# Patient Record
Sex: Female | Born: 1950 | Race: White | Hispanic: No | Marital: Married | Smoking: Former smoker
Health system: Southern US, Community
[De-identification: ages and names within clinical notes are randomized; demographics above are authoritative.]

## PROBLEM LIST (undated history)

## (undated) DIAGNOSIS — I10 Essential (primary) hypertension: Secondary | ICD-10-CM

## (undated) DIAGNOSIS — IMO0001 Reserved for inherently not codable concepts without codable children: Secondary | ICD-10-CM

## (undated) DIAGNOSIS — F32A Depression, unspecified: Secondary | ICD-10-CM

## (undated) DIAGNOSIS — G4730 Sleep apnea, unspecified: Secondary | ICD-10-CM

## (undated) DIAGNOSIS — J189 Pneumonia, unspecified organism: Secondary | ICD-10-CM

## (undated) DIAGNOSIS — E785 Hyperlipidemia, unspecified: Secondary | ICD-10-CM

## (undated) DIAGNOSIS — T8859XA Other complications of anesthesia, initial encounter: Secondary | ICD-10-CM

## (undated) DIAGNOSIS — F329 Major depressive disorder, single episode, unspecified: Secondary | ICD-10-CM

## (undated) DIAGNOSIS — E119 Type 2 diabetes mellitus without complications: Secondary | ICD-10-CM

## (undated) DIAGNOSIS — J302 Other seasonal allergic rhinitis: Secondary | ICD-10-CM

## (undated) DIAGNOSIS — F419 Anxiety disorder, unspecified: Secondary | ICD-10-CM

## (undated) DIAGNOSIS — K219 Gastro-esophageal reflux disease without esophagitis: Secondary | ICD-10-CM

## (undated) DIAGNOSIS — R42 Dizziness and giddiness: Secondary | ICD-10-CM

## (undated) DIAGNOSIS — M539 Dorsopathy, unspecified: Secondary | ICD-10-CM

## (undated) DIAGNOSIS — M1990 Unspecified osteoarthritis, unspecified site: Secondary | ICD-10-CM

## (undated) DIAGNOSIS — N189 Chronic kidney disease, unspecified: Secondary | ICD-10-CM

## (undated) DIAGNOSIS — T4145XA Adverse effect of unspecified anesthetic, initial encounter: Secondary | ICD-10-CM

## (undated) HISTORY — DX: Hyperlipidemia, unspecified: E78.5

## (undated) HISTORY — PX: TUBAL LIGATION: SHX77

## (undated) HISTORY — PX: TONSILLECTOMY: SUR1361

## (undated) HISTORY — PX: BACK SURGERY: SHX140

## (undated) HISTORY — PX: NASAL SINUS SURGERY: SHX719

---

## 2010-10-22 ENCOUNTER — Emergency Department (HOSPITAL_COMMUNITY)
Admission: EM | Admit: 2010-10-22 | Discharge: 2010-10-22 | Disposition: A | Discharge status: Home / Self Care | Payer: BC Managed Care – PPO | Attending: Emergency Medicine | Admitting: Emergency Medicine

## 2010-10-22 DIAGNOSIS — K5289 Other specified noninfective gastroenteritis and colitis: Secondary | ICD-10-CM | POA: Insufficient documentation

## 2010-10-22 DIAGNOSIS — I10 Essential (primary) hypertension: Secondary | ICD-10-CM | POA: Insufficient documentation

## 2010-10-22 LAB — DIFFERENTIAL
Eosinophils Relative: 0 % (ref 0–5)
Lymphocytes Relative: 4 % — ABNORMAL LOW (ref 12–46)
Lymphs Abs: 0.5 10*3/uL — ABNORMAL LOW (ref 0.7–4.0)
Monocytes Absolute: 0.8 10*3/uL (ref 0.1–1.0)
Monocytes Relative: 6 % (ref 3–12)

## 2010-10-22 LAB — COMPREHENSIVE METABOLIC PANEL
Albumin: 3.7 g/dL (ref 3.5–5.2)
BUN: 21 mg/dL (ref 6–23)
Chloride: 95 mEq/L — ABNORMAL LOW (ref 96–112)
Creatinine, Ser: 1.2 mg/dL (ref 0.4–1.2)
GFR calc non Af Amer: 46 mL/min — ABNORMAL LOW (ref >60)
Total Bilirubin: 0.6 mg/dL (ref 0.3–1.2)

## 2010-10-22 LAB — LIPASE, BLOOD: Lipase: 14 U/L (ref 11–59)

## 2010-10-22 LAB — CBC
HCT: 41 % (ref 36.0–46.0)
MCV: 99 fL (ref 78.0–100.0)
RDW: 12.2 % (ref 11.5–15.5)
WBC: 13.3 10*3/uL — ABNORMAL HIGH (ref 4.0–10.5)

## 2010-10-22 LAB — URINALYSIS, ROUTINE W REFLEX MICROSCOPIC
Ketones, ur: NEGATIVE mg/dL
Leukocytes, UA: NEGATIVE
Nitrite: NEGATIVE
Urobilinogen, UA: 0.2 mg/dL (ref 0.0–1.0)
pH: 5.5 (ref 5.0–8.0)

## 2010-10-24 ENCOUNTER — Inpatient Hospital Stay (HOSPITAL_COMMUNITY)
Admission: EM | Admit: 2010-10-24 | Discharge: 2010-10-28 | DRG: 182 | Disposition: A | Discharge status: Home / Self Care | Payer: BC Managed Care – PPO | Attending: Internal Medicine | Admitting: Internal Medicine

## 2010-10-24 ENCOUNTER — Emergency Department (HOSPITAL_COMMUNITY): Payer: BC Managed Care – PPO

## 2010-10-24 DIAGNOSIS — K219 Gastro-esophageal reflux disease without esophagitis: Secondary | ICD-10-CM | POA: Diagnosis present

## 2010-10-24 DIAGNOSIS — A0472 Enterocolitis due to Clostridium difficile, not specified as recurrent: Principal | ICD-10-CM | POA: Diagnosis present

## 2010-10-24 DIAGNOSIS — E876 Hypokalemia: Secondary | ICD-10-CM | POA: Diagnosis not present

## 2010-10-24 DIAGNOSIS — F3289 Other specified depressive episodes: Secondary | ICD-10-CM | POA: Diagnosis present

## 2010-10-24 DIAGNOSIS — I10 Essential (primary) hypertension: Secondary | ICD-10-CM | POA: Diagnosis present

## 2010-10-24 DIAGNOSIS — F329 Major depressive disorder, single episode, unspecified: Secondary | ICD-10-CM | POA: Diagnosis present

## 2010-10-24 DIAGNOSIS — R7401 Elevation of levels of liver transaminase levels: Secondary | ICD-10-CM | POA: Diagnosis present

## 2010-10-24 DIAGNOSIS — R7402 Elevation of levels of lactic acid dehydrogenase (LDH): Secondary | ICD-10-CM | POA: Diagnosis present

## 2010-10-24 LAB — COMPREHENSIVE METABOLIC PANEL
ALT: 45 U/L — ABNORMAL HIGH (ref 0–35)
CO2: 27 mEq/L (ref 19–32)
Calcium: 9.3 mg/dL (ref 8.4–10.5)
Chloride: 101 mEq/L (ref 96–112)
Creatinine, Ser: 0.8 mg/dL (ref 0.4–1.2)
GFR calc Af Amer: >60 mL/min (ref >60)
GFR calc non Af Amer: >60 mL/min (ref >60)
Glucose, Bld: 99 mg/dL (ref 70–99)
Sodium: 136 mEq/L (ref 135–145)
Total Bilirubin: 0.5 mg/dL (ref 0.3–1.2)

## 2010-10-24 LAB — CBC
Hemoglobin: 13.2 g/dL (ref 12.0–15.0)
MCH: 33.2 pg (ref 26.0–34.0)
MCV: 99 fL (ref 78.0–100.0)
RBC: 3.97 MIL/uL (ref 3.87–5.11)

## 2010-10-24 LAB — URINALYSIS, ROUTINE W REFLEX MICROSCOPIC
Ketones, ur: NEGATIVE mg/dL
Protein, ur: NEGATIVE mg/dL
Urobilinogen, UA: 0.2 mg/dL (ref 0.0–1.0)

## 2010-10-24 LAB — URINE MICROSCOPIC-ADD ON

## 2010-10-24 LAB — DIFFERENTIAL
Lymphs Abs: 0.7 10*3/uL (ref 0.7–4.0)
Monocytes Relative: 12 % (ref 3–12)
Neutro Abs: 5.9 10*3/uL (ref 1.7–7.7)
Neutrophils Relative %: 76 % (ref 43–77)

## 2010-10-24 LAB — CLOSTRIDIUM DIFFICILE BY PCR: Toxigenic C. Difficile by PCR: POSITIVE — AB

## 2010-10-24 LAB — POCT OCCULT BLOOD STOOL (DEVICE): Fecal Occult Bld: POSITIVE

## 2010-10-24 MED ORDER — IOHEXOL 300 MG/ML  SOLN
100.0000 mL | Freq: Once | INTRAMUSCULAR | Status: AC | PRN
  Administered 2010-10-24: 100 mL via INTRAVENOUS

## 2010-10-25 LAB — BASIC METABOLIC PANEL
BUN: 5 mg/dL — ABNORMAL LOW (ref 6–23)
CO2: 24 mEq/L (ref 19–32)
Calcium: 8.5 mg/dL (ref 8.4–10.5)
Chloride: 105 mEq/L (ref 96–112)
Creatinine, Ser: 0.76 mg/dL (ref 0.4–1.2)
GFR calc Af Amer: >60 mL/min (ref >60)
GFR calc non Af Amer: >60 mL/min (ref >60)
Glucose, Bld: 86 mg/dL (ref 70–99)
Potassium: 3.3 mEq/L — ABNORMAL LOW (ref 3.5–5.1)
Sodium: 138 mEq/L (ref 135–145)

## 2010-10-25 LAB — OVA AND PARASITE EXAMINATION

## 2010-10-25 LAB — FECAL LACTOFERRIN, QUANT: Fecal Lactoferrin: POSITIVE

## 2010-10-26 LAB — DIFFERENTIAL
Eosinophils Relative: 4 % (ref 0–5)
Lymphocytes Relative: 18 % (ref 12–46)
Lymphs Abs: 1.2 10*3/uL (ref 0.7–4.0)
Monocytes Absolute: 0.9 10*3/uL (ref 0.1–1.0)

## 2010-10-26 LAB — CBC
HCT: 37 % (ref 36.0–46.0)
Hemoglobin: 12.6 g/dL (ref 12.0–15.0)
MCH: 33.5 pg (ref 26.0–34.0)
MCHC: 34.1 g/dL (ref 30.0–36.0)
MCV: 98.4 fL (ref 78.0–100.0)
Platelets: 184 K/uL (ref 150–400)
RBC: 3.76 MIL/uL — ABNORMAL LOW (ref 3.87–5.11)
RDW: 11.9 % (ref 11.5–15.5)
WBC: 6.5 K/uL (ref 4.0–10.5)

## 2010-10-26 LAB — BASIC METABOLIC PANEL
BUN: 3 mg/dL — ABNORMAL LOW (ref 6–23)
Chloride: 106 mEq/L (ref 96–112)
GFR calc non Af Amer: >60 mL/min (ref >60)
Glucose, Bld: 106 mg/dL — ABNORMAL HIGH (ref 70–99)
Potassium: 4 mEq/L (ref 3.5–5.1)

## 2010-10-26 LAB — HEPATIC FUNCTION PANEL
AST: 60 U/L — ABNORMAL HIGH (ref 0–37)
Bilirubin, Direct: 0.2 mg/dL (ref 0.0–0.3)
Total Bilirubin: 0.6 mg/dL (ref 0.3–1.2)

## 2010-10-28 NOTE — Discharge Summary (Signed)
Ariel Meyer, Ariel Meyer              ACCOUNT NO.:  [id number]  MEDICAL RECORD NO.:  [id number]  LOCATION:  A303                          FACILITY:  [facility]  PHYSICIAN:  Elliot Cousin, M.D.    DATE OF BIRTH:  [birth year]  DATE OF ADMISSION:  10/24/2010 DATE OF DISCHARGE:  06/16/2012LH                         DISCHARGE SUMMARY-REFERRING   DATE OF DISCHARGE:  Either October 28, 2010 or October 29, 2010.  DISCHARGE DIAGNOSES: 1. Diarrhea secondary to Clostridium difficile colitis. 2. Hypokalemia, secondary to diarrhea. 3. Mild hepatic transaminitis of unknown significance. 4. Elevated lipase, which resolved. 5. History of hypertension with low normal blood pressures during the     hospitalization. 6. Gastroesophageal reflux disease. 7. Depression.  DISCHARGE MEDICATIONS:  To be dictated at the time of hospital discharge.  CONSULTATIONS:  None.  PROCEDURES PERFORMED:  CT scan of the abdomen and pelvis on October 24, 2010.  The results revealed a suspicion of colitis superimposed upon the colonic under distention.  Ascending and proximal to mid transverse colon involvement.  Normal apex.  Remote left tenth rib fracture.  Left hepatic lobe cyst.  Normal spleen, stomach, pancreas, gallbladder, biliary tract, and adrenal glands.  HISTORY OF PRESENT ILLNESS:  The patient is a 60 year old woman with a past medical history significant for hypertension, who presented to the emergency department on October 24, 2010 with a chief complaint of diarrhea.  During the history gathering, the patient disclosed that she had taken a Z-PAK in March for treatment of an infection.  Since that time, she has had no further antibiotic therapy.  During the initial evaluation in the emergency department, the patient was noted to be afebrile and hemodynamically stable, although her blood pressure was on the lower side of normal.  Her white blood cell count was 7.8.  Her blood lipase was elevated at 70.  Her SGOT was  elevated at 49, and her SGPT was elevated at 45.  She was admitted for further evaluation and management.  HOSPITAL COURSE:  The patient was started on Cipro and Flagyl empirically.  A CT scan of her abdomen and pelvis was ordered.  The findings were consistent with colitis.  Stool studies were ordered. Stool for fecal blood was positive.  Fecal lactoferrin was positive. Fecal ova and parasite exam was negative, however, there was a moderate amount of WBCs.  C. difficile by PCR was positive.  Once this was known, Cipro was discontinued and Flagyl was continued orally as previously ordered.  Her nausea was treated with as-needed Zofran and Phenergan.  Her pain was treated with as-needed hydromorphone.  Maintenance IV fluids were started on admission.  She was also given a clear liquid diet initially.  Her IV fluids were changed to include potassium chloride as her serum potassium fell to 3.3.  Once her nausea subsided, she was repleted with potassium chloride orally.  Blood cultures, which had been ordered on admission have remained negative.  Her liver transaminases remained elevated, but only slightly so.  Her SGOT increased slightly to 60 and her SGPT increased slightly to 55.  On the CT scan of her abdomen and pelvis, it was noted that she had a hepatic cyst, but no other abnormalities  with the gallbladder or the biliary tree.  This can be monitored in the outpatient setting. Also, the patient's lipase was modestly elevated on admission, but normalized to 28.  Her amylase was within normal limits at 36.  She had no complaints of pain with urination.  Her urinalysis was negative for nitrites and leukocytes.  Later, she did complain of gastroesophageal reflux symptoms.  Omeprazole was initially withheld due to C. difficile infection.  Pepcid was started for her symptoms, however, after several days of Flagyl therapy, it was decided that omeprazole could be safely restarted.   Therefore, Pepcid was discontinued in favor of omeprazole due to her rebound reflux symptoms.  Her blood pressures have been on the lower side of normal.  Both amlodipine and hydrochlorothiazide were withheld during the hospitalization while the patient was being hydrated.  It is likely that she may need to restart amlodipine in the next few days.  Over the past 24 hours, the extent of her diarrhea has subsided.  Her stools are starting to form some; they are less watery.  Her diet was advanced to a full liquid diet yesterday.  It will be advanced to a low residue diet for dinner today.  If she continues to tolerate solid foods and if her symptoms remained improved, she could possibly be discharged to home on Flagyl for a total of 10-14 days in the next 24-48 hours.  We will add Flora-Q 1 capsule daily in the morning.  The patient will be evaluated by Dr. Crista Curb tomorrow.  Final disposition will be determined by her.  In the meantime, the patient was advised to increase her activity in the room and to sit up out of bed at least several hours daily.     Elliot Cousin, M.D.     DF/MEDQ  D:  10/27/2010  T:  10/27/2010  Job:  [id number]  Electronically Signed by Elliot Cousin M.D. on 10/28/2010 07:01:29 AM

## 2010-10-29 LAB — CULTURE, BLOOD (ROUTINE X 2)
Culture: NO GROWTH
Culture: NO GROWTH

## 2010-10-29 NOTE — H&P (Signed)
NAMEMEGHNA, HAGMANN              ACCOUNT NO.:  [id number]  MEDICAL RECORD NO.:  [id number]  LOCATION:  A303                          FACILITY:  [facility]  PHYSICIAN:  Celso Amy, MD   DATE OF BIRTH:  1951-01-29  DATE OF ADMISSION:  10/24/2010 DATE OF DISCHARGE:  LH                             HISTORY & PHYSICAL   CHIEF COMPLAINT:  Diarrhea.  HISTORY OF PRESENT ILLNESS:  The patient is a 60 year old white female with a past medical history of hypertension who presented to ER with a chief complaint of diarrhea.  History of present illness dates back to 5 days ago when the patient had sudden onset of diarrhea.  The patient says that the number of episodes are innumerable.  The consistency is watery and petering has been continuous.  The course has been unchanged over past 5 days.  The patient came to the ER on June 10 with a chief complaint of nausea, vomiting at that time.  The patient was seen in the ER, was thought it was gastroenteritis.  The patient was discharged home and the patient's nausea, vomiting resolved, but the patient's diarrhea continued to persist.  The patient offers no complaint of blood in stool.  No complaint of blood in emesis.  No complaint of abdominal pain.  The patient complains of fever which was last time around 2 days ago.  The patient has not been febrile since then.  The patient has history of antibiotic use in March 2012 when the patient was on Z-Pak. The patient has a granddaughter whom she takes care of and there was a history of antibiotic use in the granddaughter.  There is no family history of similar sickness.  The patient has well-water supply.  Does not have a county supply.  There is no history of recent travel.  No complaint of dizziness, shortness of breath, dysuria, vertigo or vomiting.  No complaint of any focal motor deficits.  No complaint of change in vision.  No complaint of hematuria.  REVIEW OF SYSTEMS:  Review of system is  negative besides the HPI.  ALLERGIES:  The patient has no known drug allergies.  FAMILY HISTORY:  The patient's mother died at the age of 67 from dementia.  The patient's father died at the age of 20 from heart disease.  SOCIAL HISTORY:  The patient quit smoking in 2010.  The patient drinks occasionally.  PAST MEDICAL HISTORY:  Positive for; 1. Nasal allergies. 2. GERD. 3. Hypertension. 4. Osteoarthritis.  MEDICATIONS:  The patient is on; 1. Lomotil p.r.n. 2. Lortab 5/325 p.r.n. 3. Multivitamin 1 tablet p.o. daily. 4. Osteo Bi-Flex 1 tablet p.o. daily. 5. Vitamin D3 1000 units 1 tablet p.o. daily. 6. Amlodipine 5 mg 1 tablet p.o. daily. 7. Fluoxetine 60 mg 1 tablet p.o. daily. 8. Hydrochlorothiazide 25 mg 1 tablet p.o. daily. 9. Omeprazole 20 mg 1 tablet p.o. daily.  PHYSICAL EXAMINATION:  VITAL SIGNS:  Blood pressures ranging from 98-112 by 59-74, pulse 76, respiratory rate 16, temperature afebrile. GENERAL:  The patient is awake, alert, oriented to time, place and person, resting comfortably in the bed, well built. HEENT:  Pupils are equally reactive to light and accommodation.  Extraocular movement intact.  Head is atraumatic, normocephalic.  NECK: Supple. RESPIRATORY:  No acute respiratory distress.  Chest is clear to auscultation bilaterally. CARDIOVASCULAR:  S1 and S2 is regular in rhythm.  No murmurs or rubs were appreciated. GIT:  Bowel sounds are present.  Abdomen is soft, nontender, nondistended. EXTREMITIES:  No lower extremity edema.  No cyanosis was seen. SKIN:  There is no rash. PSYCH:  The patient is in normal mood and affect.  LABORATORY FINDINGS:  The patient's labs done on June 12 shows sodium of 136, potassium of 3.6, serum chloride 101, bicarb 27, BUN 9, serum creatinine 0.8, glucose is 99.  The patient's WBC 7.8, the patient's hemoglobin 13.2, patient's platelets 185, ALP is 128, AST is 49, ALT is 45, lipase is 70.  The patient had a CT abdomen  which shows suspect colitis superimposed on colonic underdistention.  IMPRESSION: 1. Gastrointestinal .  The patient is being admitted with colitis.     The patient has remote history of antibiotic exposure.  The patient     has well water source, but nobody else is sick in the family.  This     seems like an infectious etiology. 2. Cardiovascular:  The patient is hypotensive but no need of     pressors. 3. Fluid, electrolyte, nutrition.  The patient is hypovolemic, is     normokalemic, normonatremic. 4. Deep vein thrombosis.  We will keep the patient on DVT prophylaxis.  PLAN: 1. We will get stools for lactoferrin, wbc, ova, parasites and C. diff     PCR. 2. We will start the patient on Cipro and Flagyl as this seems like an     infectious colitis. 3. We will start the patient on IV fluids as the patient is     hypovolemic. 4. We will hold antihypertensive medicines which are ACE and     hydrochlorothiazide as the patient is     hypovolemic. 5. We will keep the patient on DVT prophylaxis. 6. The patient's further course depends how the patient does with this     treatment.     Celso Amy, MD     MB/MEDQ  D:  10/24/2010  T:  10/25/2010  Job:  [id number]  Electronically Signed by Celso Amy M.D. on 10/29/2010 12:21:44 PM

## 2010-10-29 NOTE — H&P (Signed)
  Ariel Meyer, Ariel Meyer              ACCOUNT NO.:  [id number]  MEDICAL RECORD NO.:  [id number]  LOCATION:  A303                          FACILITY:  [facility]  PHYSICIAN:  Celso Amy, MD   DATE OF BIRTH:  1950/05/16  DATE OF ADMISSION:  10/24/2010 DATE OF DISCHARGE:  LH                             HISTORY & PHYSICAL   ADDENDUM:  ALLERGIES:  The patient is allergic to SULFA, this causes her to have inflamed mucous membranes; MACRODANTIN, this causes her to have vomiting and passing out; PENICILLIN, this causes her to have rash and she cannot take Tylenol because of fatty liver.     Celso Amy, MD     MB/MEDQ  D:  10/24/2010  T:  10/25/2010  Job:  [id number]  Electronically Signed by Celso Amy M.D. on 10/29/2010 12:21:50 PM

## 2010-10-30 NOTE — Discharge Summary (Signed)
  NAMEJAMIE-LEE, Ariel Meyer              ACCOUNT NO.:  [id number]  MEDICAL RECORD NO.:  [id number]  LOCATION:  A303                          FACILITY:  [facility]  PHYSICIAN:  Lonny Eisen L. Lendell Meyer, MDDATE OF BIRTH:  1950/07/16  DATE OF ADMISSION:  10/24/2010 DATE OF DISCHARGE:  06/16/2012LH                              DISCHARGE SUMMARY   ADDENDUM  This is an addendum to the previously dictated discharge summary by Dr. Sherrie Mustache.  DISCHARGE MEDICATIONS: 1. Metronidazole 500 mg p.o. t.i.d. until gone to complete a 10-day     course. 2. Flora-Q p.o. daily. 3. Carafate slurry 1 g p.o. q.a.c. and at bedtime. 4. Hydrocodone/APAP 5/325 mg 1 p.o. q.6 h. p.r.n. pain. 5. Osteo Bi-Flex daily. 6. Vitamin D3 1000 units daily. 7. Hold amlodipine and hydrochlorothiazide until systolic blood     pressure above 130. 8. Fluoxetine as previous. 9. Stop omeprazole. 10.Continue aspirin 325 mg p.o. b.i.d. p.r.n. pain. 11.Zyrtec 10 mg a day. 12.Artificial tears daily as needed.  CONDITION:  Stable.  ACTIVITY:  Ad lib.  DIET:  As tolerated.  Overnight, the patient did have some acid reflux and vomiting.  Today, however, she is doing better.  She was able to tolerate 100% of her solid lunch and is requesting discharge.  Total time on the day of discharge is greater than 30 minutes.     Ariel Switalski L. Lendell Caprice, MD     CLS/MEDQ  D:  10/28/2010  T:  10/29/2010  Job:  [id number]  Electronically Signed by Crista Curb MD on 10/30/2010 10:50:25 AM

## 2011-03-22 ENCOUNTER — Other Ambulatory Visit: Payer: Self-pay | Admitting: Podiatry

## 2011-03-22 DIAGNOSIS — M79673 Pain in unspecified foot: Secondary | ICD-10-CM

## 2011-03-22 DIAGNOSIS — M25571 Pain in right ankle and joints of right foot: Secondary | ICD-10-CM

## 2011-03-23 ENCOUNTER — Other Ambulatory Visit: Payer: BC Managed Care – PPO

## 2011-03-27 ENCOUNTER — Ambulatory Visit
Admission: RE | Admit: 2011-03-27 | Discharge: 2011-03-27 | Disposition: A | Discharge status: Home / Self Care | Payer: BC Managed Care – PPO | Source: Ambulatory Visit | Attending: Podiatry | Admitting: Podiatry

## 2011-03-27 DIAGNOSIS — M79673 Pain in unspecified foot: Secondary | ICD-10-CM

## 2011-03-27 DIAGNOSIS — M25571 Pain in right ankle and joints of right foot: Secondary | ICD-10-CM

## 2011-05-22 ENCOUNTER — Other Ambulatory Visit: Payer: Self-pay | Admitting: Family Medicine

## 2011-05-22 DIAGNOSIS — Z1231 Encounter for screening mammogram for malignant neoplasm of breast: Secondary | ICD-10-CM

## 2011-06-11 ENCOUNTER — Ambulatory Visit
Admission: RE | Admit: 2011-06-11 | Discharge: 2011-06-11 | Disposition: A | Discharge status: Home / Self Care | Payer: BC Managed Care – PPO | Source: Ambulatory Visit | Attending: Family Medicine | Admitting: Family Medicine

## 2011-06-11 DIAGNOSIS — Z1231 Encounter for screening mammogram for malignant neoplasm of breast: Secondary | ICD-10-CM

## 2012-05-28 ENCOUNTER — Emergency Department (HOSPITAL_COMMUNITY): Payer: BC Managed Care – PPO

## 2012-05-28 ENCOUNTER — Emergency Department (HOSPITAL_COMMUNITY)
Admission: EM | Admit: 2012-05-28 | Discharge: 2012-05-28 | Disposition: A | Discharge status: Home / Self Care | Payer: BC Managed Care – PPO | Attending: Emergency Medicine | Admitting: Emergency Medicine

## 2012-05-28 ENCOUNTER — Encounter (HOSPITAL_COMMUNITY): Payer: Self-pay

## 2012-05-28 DIAGNOSIS — K219 Gastro-esophageal reflux disease without esophagitis: Secondary | ICD-10-CM | POA: Insufficient documentation

## 2012-05-28 DIAGNOSIS — Y9389 Activity, other specified: Secondary | ICD-10-CM | POA: Insufficient documentation

## 2012-05-28 DIAGNOSIS — S32009A Unspecified fracture of unspecified lumbar vertebra, initial encounter for closed fracture: Secondary | ICD-10-CM | POA: Insufficient documentation

## 2012-05-28 DIAGNOSIS — S32010A Wedge compression fracture of first lumbar vertebra, initial encounter for closed fracture: Secondary | ICD-10-CM

## 2012-05-28 DIAGNOSIS — Y92009 Unspecified place in unspecified non-institutional (private) residence as the place of occurrence of the external cause: Secondary | ICD-10-CM | POA: Insufficient documentation

## 2012-05-28 DIAGNOSIS — R51 Headache: Secondary | ICD-10-CM | POA: Insufficient documentation

## 2012-05-28 DIAGNOSIS — W010XXA Fall on same level from slipping, tripping and stumbling without subsequent striking against object, initial encounter: Secondary | ICD-10-CM | POA: Insufficient documentation

## 2012-05-28 DIAGNOSIS — R0602 Shortness of breath: Secondary | ICD-10-CM | POA: Insufficient documentation

## 2012-05-28 DIAGNOSIS — I10 Essential (primary) hypertension: Secondary | ICD-10-CM | POA: Insufficient documentation

## 2012-05-28 DIAGNOSIS — Z79899 Other long term (current) drug therapy: Secondary | ICD-10-CM | POA: Insufficient documentation

## 2012-05-28 DIAGNOSIS — R0789 Other chest pain: Secondary | ICD-10-CM | POA: Insufficient documentation

## 2012-05-28 DIAGNOSIS — Z8739 Personal history of other diseases of the musculoskeletal system and connective tissue: Secondary | ICD-10-CM | POA: Insufficient documentation

## 2012-05-28 HISTORY — DX: Essential (primary) hypertension: I10

## 2012-05-28 HISTORY — DX: Dorsopathy, unspecified: M53.9

## 2012-05-28 MED ORDER — ONDANSETRON HCL 4 MG/2ML IJ SOLN
4.0000 mg | Freq: Once | INTRAMUSCULAR | Status: AC
  Administered 2012-05-28: 4 mg via INTRAVENOUS
  Filled 2012-05-28: qty 2

## 2012-05-28 MED ORDER — HYDROMORPHONE HCL PF 1 MG/ML IJ SOLN
1.0000 mg | Freq: Once | INTRAMUSCULAR | Status: AC
  Administered 2012-05-28: 1 mg via INTRAVENOUS
  Filled 2012-05-28: qty 1

## 2012-05-28 MED ORDER — SODIUM CHLORIDE 0.9 % IV BOLUS (SEPSIS)
250.0000 mL | Freq: Once | INTRAVENOUS | Status: AC
  Administered 2012-05-28: 12:00:00 via INTRAVENOUS

## 2012-05-28 MED ORDER — PROMETHAZINE HCL 25 MG PO TABS: 25.0000 mg | ORAL_TABLET | Freq: Four times a day (QID) | ORAL | Status: DC | PRN

## 2012-05-28 MED ORDER — SODIUM CHLORIDE 0.9 % IV SOLN
INTRAVENOUS | Status: DC
  Administered 2012-05-28: 12:00:00 via INTRAVENOUS

## 2012-05-28 MED ORDER — HYDROMORPHONE HCL 2 MG PO TABS: 2.0000 mg | ORAL_TABLET | ORAL | Status: DC | PRN

## 2012-05-28 MED ORDER — HYDROCODONE-ACETAMINOPHEN 5-325 MG PO TABS: 1.0000 | ORAL_TABLET | Freq: Four times a day (QID) | ORAL | Status: AC | PRN

## 2012-05-28 NOTE — ED Notes (Signed)
Patient still wants something to drink. RN made aware.

## 2012-05-28 NOTE — ED Notes (Signed)
Pt given meds for pain. Husband at bedside.

## 2012-05-28 NOTE — ED Notes (Signed)
Pt was putting wood in her wood stove. She lost her balance and fell. Complain of pain in lower back. Has a history of chronic back pain

## 2012-05-28 NOTE — ED Notes (Signed)
Patient would like something to drink. RN made aware. 

## 2012-05-28 NOTE — ED Notes (Signed)
Patient took her neck brace off at this time.

## 2012-05-28 NOTE — ED Notes (Signed)
Pt given additional meds for pain. Husband at bedside.

## 2012-05-28 NOTE — ED Notes (Signed)
Pt resting in bed, pain better after first meds. Repeating meds for continued pain.

## 2012-05-28 NOTE — ED Notes (Signed)
Patient wanted something to drink MD made aware wanted to wait till after CT scan. Patient made aware. Patient also states she would like some pain medicine. RN made aware.

## 2012-05-28 NOTE — ED Provider Notes (Signed)
History  This chart was scribed for Shelda Jakes, MD by Erskine Emery, ED Scribe. This patient was seen in room [facility] and the patient's care was started at 10:28.   CSN: [id number]  Arrival date & time 05/28/12  0944   First MD Initiated Contact with Patient 05/28/12 1028      Chief Complaint  Patient presents with  . Fall    (Consider location/radiation/quality/duration/timing/severity/associated sxs/prior treatment) The history is provided by the patient. No language interpreter was used.  Ariel Meyer is a 62 y.o. female brought in by ambulance, who presents to the Emergency Department complaining of aggravated bilateral low back pain since a fall this morning. Pt reports she was putting wood in her wood stove and lost her balance, falling onto her bottom. Pt has a h/o chronic back pain, aggravated by bending over. She does not take any medication other than ibuprofen for it. Pt reports some associated SOB, acid reflux from laying flat, and headache, but she denies any numbness in her feet, fevers, chest pain, abdominal pain, nausea, emesis, diarrhea, dysuria, rash, visual changes, or h/o bleeding easily. She has been on Zithromax for 3 days for a recent cold, including congestion, cough, and chest soreness. Pt is allergic to acetaminophen, penicillins, sudafed, macrodantin, and sulfa antibiotics.  Dr. Creta Levin at [facility] in [place] is the pt's PCP.  Past Medical History  Diagnosis Date  . Hypertension   . Back problem     Past Surgical History  Procedure Date  . Tubal ligation     No family history on file.  History  Substance Use Topics  . Smoking status: Not on file  . Smokeless tobacco: Not on file  . Alcohol Use: Yes    OB History    Grav Para Term Preterm Abortions TAB SAB Ect Mult Living                  Review of Systems  Constitutional: Negative for fever and chills.  HENT: Positive for congestion. Negative for sore throat and neck  pain.   Eyes: Negative for visual disturbance.  Respiratory: Positive for cough, chest tightness and shortness of breath.   Cardiovascular: Negative for chest pain.  Gastrointestinal: Negative for nausea, vomiting, abdominal pain and diarrhea.  Genitourinary: Negative for dysuria.  Musculoskeletal: Positive for back pain.  Skin: Negative for rash.  Neurological: Positive for headaches. Negative for weakness and numbness.  All other systems reviewed and are negative.    Allergies  Acetaminophen; Penicillins; Sudafed; Sulfa antibiotics; and Macrodantin  Home Medications   Current Outpatient Rx  Name  Route  Sig  Dispense  Refill  . RISAQUAD PO CAPS   Oral   Take 1 capsule by mouth daily.         Marland Kitchen AMLODIPINE BESYLATE 5 MG PO TABS   Oral   Take 5 mg by mouth daily.         . ATORVASTATIN CALCIUM 20 MG PO TABS   Oral   Take 20 mg by mouth daily.         . AZITHROMYCIN 250 MG PO TABS   Oral   Take 250 mg by mouth daily. 2 tabs on day 1, 1 tab thereafter until gone.         Marland Kitchen VITAMIN D 1000 UNITS PO TABS   Oral   Take 1,000 Units by mouth daily.         Marland Kitchen FLUOXETINE HCL 20 MG PO CAPS   Oral  Take 20 mg by mouth daily.         Marland Kitchen FLUTICASONE PROPIONATE 50 MCG/ACT NA SUSP   Nasal   Place 2 sprays into the nose daily.         Marland Kitchen HYDROCHLOROTHIAZIDE 25 MG PO TABS   Oral   Take 25 mg by mouth daily.         . OSTEO BI-FLEX ADV DOUBLE ST PO   Oral   Take 1 tablet by mouth daily.         . ADULT MULTIVITAMIN W/MINERALS CH   Oral   Take 1 tablet by mouth daily.         Marland Kitchen OMEPRAZOLE 40 MG PO CPDR   Oral   Take 40 mg by mouth daily.         Marland Kitchen HYDROCODONE-ACETAMINOPHEN 5-325 MG PO TABS   Oral   Take 1-2 tablets by mouth every 6 (six) hours as needed for pain.   20 tablet   0   . HYDROMORPHONE HCL 2 MG PO TABS   Oral   Take 1 tablet (2 mg total) by mouth every 4 (four) hours as needed for pain.   30 tablet   0   . PROMETHAZINE HCL 25 MG  PO TABS   Oral   Take 1 tablet (25 mg total) by mouth every 6 (six) hours as needed for nausea.   20 tablet   0     Triage Vitals: BP 158/102  Pulse 76  Temp 98.2 F (36.8 C) (Oral)  Resp 22  Ht 5\' 6"  (1.676 m)  Wt 220 lb (99.791 kg)  BMI 35.51 kg/m2  SpO2 96%  Physical Exam  Nursing note and vitals reviewed. Constitutional: She is oriented to person, place, and time. She appears well-developed and well-nourished. No distress.  HENT:  Head: Normocephalic and atraumatic.  Mouth/Throat: Oropharynx is clear and moist.  Eyes: EOM are normal. Pupils are equal, round, and reactive to light.  Neck: Neck supple. No tracheal deviation present.  Cardiovascular: Normal rate, regular rhythm and normal heart sounds.   Pulmonary/Chest: Effort normal and breath sounds normal. No respiratory distress. She has no wheezes.  Abdominal: Soft. Bowel sounds are normal. She exhibits no distension. There is no tenderness.  Musculoskeletal: Normal range of motion. She exhibits no edema.       Dorsalis pedis pulse is 2+ on both feet. Good movement of feet.  Lymphadenopathy:    She has no cervical adenopathy.  Neurological: She is alert and oriented to person, place, and time.  Skin: Skin is warm and dry.  Psychiatric: She has a normal mood and affect.    ED Course  Procedures (including critical care time) DIAGNOSTIC STUDIES: Oxygen Saturation is 96% on room air, adequate by my interpretation.    COORDINATION OF CARE: 11:29--I evaluated the patient and we discussed a treatment plan including IV pain medication, and chest and back x-rays to which the pt agreed.   Dg Chest 1 View  05/28/2012  *RADIOLOGY REPORT*  Clinical Data: Fall with back pain.  CHEST - 1 VIEW  Comparison: None.  Findings: Trachea is midline.  Heart size normal.  Transverse aorta appears prominent.  Scarring at the left lung base.  Lungs are otherwise clear.  No pleural flow  IMPRESSION:  1.  No acute findings. 2.  Transverse  aorta appears prominent, possibly ectatic.   Original Report Authenticated By: Leanna Battles, M.D.    Dg Lumbar Spine Complete  05/28/2012  *  RADIOLOGY REPORT*  Clinical Data: The fall with back pain.  LUMBAR SPINE - COMPLETE 4+ VIEW  Comparison: None.  Findings: Compression fracture of the L1 vertebral body is new since a CT scan of 10/24/2010. Imaging features are nonspecific, but this could represent an acute injury.  Lateral film is slightly rotated, but retropulsion of fragments in the anterior canal is a possibility.  IMPRESSION: L1 superior endplate fracture with approximate 25% loss of height anteriorly.  There is a question of posterior bony retropulsion into the anterior canal.  CT scan of the lumbar spine without contrast could be used further evaluate.   Original Report Authenticated By: Kennith Center, M.D.    Ct Lumbar Spine Wo Contrast  05/28/2012  *RADIOLOGY REPORT*  Clinical Data: Fall.  Low back pain.  Difficulty moving legs.  CT LUMBAR SPINE WITHOUT CONTRAST  Technique:  Multidetector CT imaging of the lumbar spine was performed without intravenous contrast administration. Multiplanar CT image reconstructions were also generated.  Comparison: Lumbar spine radiographs 05/28/2012.  Findings: Five non-rib bearing lumbar type vertebral bodies are present.  A superior endplate fracture is present at L1.  There is approximately 40% loss of height compared to the adjacent level, L2.  Retropulsed bone narrows the canal to 15 mm at the superior aspect of L1.  There is slight exaggerated kyphosis.  Dextroconvex scoliosis of the lumbar spine is centered at L2.  The fracture is more severe on the left than right, exaggerating the scoliosis.  No additional fractures are evident.  The remaining vertebral body heights and disc spaces are maintained.  The foramina are patent bilaterally.  Facet degenerative changes are present at L5-S1, right greater than left.  Limited imaging of the abdomen demonstrates  minimal atherosclerotic changes.  IMPRESSION:  1.  The superior endplate compression fracture at L1 is more severe left than right with slight exaggerated kyphosis and scoliosis. 2.  The retropulsed bone fragments along the superior endplate fracture of L1 narrow the canal to 15 mm without significant stenosis. 3.  Minimal atherosclerotic changes. 4.  Mild degenerative changes at the L5-S1 facet joints.   Original Report Authenticated By: Marin Roberts, M.D.       1. Compression fracture of L1 lumbar vertebra       MDM  Findings consistent with L1 lumbar vertebrae compression fracture. Patient with no neuro deficits. Pain improved in the emergency department. Will discharge home with pain medication orthopedic consultation and followup with her primary care Dr. Len Childs to her about the potential benefits of kyphoplasty.      I personally performed the services described in this documentation, which was scribed in my presence. The recorded information has been reviewed and is accurate.     Shelda Jakes, MD 05/28/12 [phone]

## 2012-05-28 NOTE — ED Notes (Signed)
Pt able to stand and walk to bathroom, dressed self. To car in wheelchair.

## 2012-05-28 NOTE — ED Notes (Signed)
Pt reports severe pain to her lower back.   Pt denies any LOC.

## 2012-06-09 ENCOUNTER — Ambulatory Visit (INDEPENDENT_AMBULATORY_CARE_PROVIDER_SITE_OTHER): Payer: BC Managed Care – PPO | Admitting: Orthopedic Surgery

## 2012-06-09 ENCOUNTER — Encounter: Payer: Self-pay | Admitting: Orthopedic Surgery

## 2012-06-09 VITALS — BP 110/80 | Ht 66.0 in | Wt 220.0 lb

## 2012-06-09 DIAGNOSIS — S32009A Unspecified fracture of unspecified lumbar vertebra, initial encounter for closed fracture: Secondary | ICD-10-CM

## 2012-06-09 DIAGNOSIS — M5430 Sciatica, unspecified side: Secondary | ICD-10-CM

## 2012-06-09 DIAGNOSIS — S32000A Wedge compression fracture of unspecified lumbar vertebra, initial encounter for closed fracture: Secondary | ICD-10-CM

## 2012-06-09 MED ORDER — PREDNISONE (PAK) 10 MG PO TABS: 10.0000 mg | ORAL_TABLET | Freq: Every day | ORAL | Status: DC

## 2012-06-09 MED ORDER — OXYCODONE-ACETAMINOPHEN 5-325 MG PO TABS: 1.0000 | ORAL_TABLET | ORAL | Status: DC | PRN

## 2012-06-09 MED ORDER — METHOCARBAMOL 500 MG PO TABS: 500.0000 mg | ORAL_TABLET | Freq: Three times a day (TID) | ORAL | Status: DC

## 2012-06-09 MED ORDER — GABAPENTIN 100 MG PO CAPS: 100.0000 mg | ORAL_CAPSULE | Freq: Three times a day (TID) | ORAL | Status: DC

## 2012-06-10 ENCOUNTER — Encounter: Payer: Self-pay | Admitting: Orthopedic Surgery

## 2012-06-10 DIAGNOSIS — S32000A Wedge compression fracture of unspecified lumbar vertebra, initial encounter for closed fracture: Secondary | ICD-10-CM | POA: Insufficient documentation

## 2012-06-10 DIAGNOSIS — M5430 Sciatica, unspecified side: Secondary | ICD-10-CM | POA: Insufficient documentation

## 2012-06-10 NOTE — Progress Notes (Signed)
  Subjective:    Patient ID: Ariel Meyer, female    DOB: December 04, 1950, 62 y.o.   MRN: [id number] Chief Complaint  Patient presents with  . Back Injury    L 1 compression fracture. DOI1-15-14.   She actually complains of bilateral lower back pain with a history of bilateral sciatica.  HPI  She says her pain is not at the fracture site but in the lower SI joints and left side of her lower back. She describes this as hip pain but denies groin pain or anterior thigh pain denies radicular symptoms denies saddle anesthesia denies bowel or bladder retention or incontinence   Review of Systems Patient response has been reviewed and is scanned as a scanned documents    Objective:   Physical Exam  Nursing note and vitals reviewed. Constitutional: She is oriented to person, place, and time. She appears well-developed and well-nourished. She appears distressed.  HENT:  Head: Normocephalic.  Neck: Normal range of motion. No JVD present. No tracheal deviation present.  Cardiovascular: Normal rate and intact distal pulses.   Pulmonary/Chest: Effort normal.  Abdominal: Soft.  Musculoskeletal:       Cervical back: Normal.       Thoracic back: Normal.       Lumbar back: She exhibits decreased range of motion, tenderness, bony tenderness, pain and spasm. She exhibits no swelling, no edema, no deformity and no laceration.  Lymphadenopathy:    She has no cervical adenopathy.  Neurological: She is alert and oriented to person, place, and time. She has normal reflexes. She displays normal reflexes. She exhibits normal muscle tone. Coordination normal.  Skin: Skin is warm and dry. No rash noted. She is not diaphoretic. No erythema. No pallor.  Psychiatric: She has a normal mood and affect. Her behavior is normal. Judgment and thought content normal.          Assessment & Plan:  CT scan and plain films show the chisel L1 compression fracture without spinal impingement or stenosis  However she  mainly complains of her sciatica on the left side in the lower back  Recommend oral prednisone Dosepak, gabapentin, Robaxin, Percocet for pain followup in one week to assess pain medication and her response to it  We discussed the possible kyphoplasty however she has no major symptoms at that level.

## 2012-06-16 ENCOUNTER — Encounter: Payer: Self-pay | Admitting: Orthopedic Surgery

## 2012-06-16 ENCOUNTER — Ambulatory Visit (INDEPENDENT_AMBULATORY_CARE_PROVIDER_SITE_OTHER): Payer: BC Managed Care – PPO | Admitting: Orthopedic Surgery

## 2012-06-16 VITALS — BP 110/78 | Ht 66.0 in | Wt 220.0 lb

## 2012-06-16 DIAGNOSIS — S32009A Unspecified fracture of unspecified lumbar vertebra, initial encounter for closed fracture: Secondary | ICD-10-CM

## 2012-06-16 DIAGNOSIS — S32000A Wedge compression fracture of unspecified lumbar vertebra, initial encounter for closed fracture: Secondary | ICD-10-CM

## 2012-06-16 DIAGNOSIS — M5430 Sciatica, unspecified side: Secondary | ICD-10-CM

## 2012-06-16 MED ORDER — OXYCODONE-ACETAMINOPHEN 5-325 MG PO TABS: 1.0000 | ORAL_TABLET | ORAL | Status: DC | PRN

## 2012-06-16 NOTE — Patient Instructions (Signed)
Continue present pain medications

## 2012-06-16 NOTE — Progress Notes (Signed)
Patient ID: Ariel Meyer, female   DOB: June 14, 1950, 62 y.o.   MRN: [id number] Chief Complaint  Patient presents with  . Follow-up    one week recheck lumbar fracture DOI 05/28/12    BP 110/78  Ht 5\' 6"  (1.676 m)  Wt 220 lb (99.791 kg)  BMI 35.51 kg/m2  1. Sciatica  oxyCODONE-acetaminophen (ROXICET) 5-325 MG per tablet  2. Lumbar compression fracture  oxyCODONE-acetaminophen (ROXICET) 5-325 MG per tablet    Current Outpatient Prescriptions on File Prior to Visit  Medication Sig Dispense Refill  . acidophilus (RISAQUAD) CAPS Take 1 capsule by mouth daily.      Marland Kitchen amLODipine (NORVASC) 5 MG tablet Take 5 mg by mouth daily.      Marland Kitchen atorvastatin (LIPITOR) 20 MG tablet Take 20 mg by mouth daily.      Marland Kitchen azithromycin (ZITHROMAX) 250 MG tablet Take 250 mg by mouth daily. 2 tabs on day 1, 1 tab thereafter until gone.      . cholecalciferol (VITAMIN D) 1000 UNITS tablet Take 1,000 Units by mouth daily.      Marland Kitchen FLUoxetine (PROZAC) 20 MG capsule Take 20 mg by mouth daily.      . fluticasone (FLONASE) 50 MCG/ACT nasal spray Place 2 sprays into the nose daily.      Marland Kitchen gabapentin (NEURONTIN) 100 MG capsule Take 1 capsule (100 mg total) by mouth 3 (three) times daily.  60 capsule  1  . hydrochlorothiazide (HYDRODIURIL) 25 MG tablet Take 25 mg by mouth daily.      Marland Kitchen HYDROmorphone (DILAUDID) 2 MG tablet Take 1 tablet (2 mg total) by mouth every 4 (four) hours as needed for pain.  30 tablet  0  . methocarbamol (ROBAXIN) 500 MG tablet Take 1 tablet (500 mg total) by mouth 3 (three) times daily.  60 tablet  1  . Misc Natural Products (OSTEO BI-FLEX ADV DOUBLE ST PO) Take 1 tablet by mouth daily.      . Multiple Vitamin (MULTIVITAMIN WITH MINERALS) TABS Take 1 tablet by mouth daily.      Marland Kitchen omeprazole (PRILOSEC) 40 MG capsule Take 40 mg by mouth daily.      . predniSONE (STERAPRED UNI-PAK) 10 MG tablet Take 1 tablet (10 mg total) by mouth daily. DS 12 days dose pack  10 tablet  1  . HYDROcodone-acetaminophen  (NORCO/VICODIN) 5-325 MG per tablet       . promethazine (PHENERGAN) 25 MG tablet Take 1 tablet (25 mg total) by mouth every 6 (six) hours as needed for nausea.  20 tablet  0  . traMADol (ULTRAM) 50 MG tablet         Followup visit status post lumbar spine fracture at L1 and bilateral hip pain in the lower back. We made improvement with Percocet, prednisone Dosepak Robaxin and Neurontin  Continue current pain management  New prescription written  Established problem stable  Followup in one week

## 2012-06-23 ENCOUNTER — Ambulatory Visit (INDEPENDENT_AMBULATORY_CARE_PROVIDER_SITE_OTHER): Payer: BC Managed Care – PPO | Admitting: Orthopedic Surgery

## 2012-06-23 VITALS — BP 106/70 | Ht 66.0 in | Wt 220.0 lb

## 2012-06-23 DIAGNOSIS — S32009A Unspecified fracture of unspecified lumbar vertebra, initial encounter for closed fracture: Secondary | ICD-10-CM

## 2012-06-23 DIAGNOSIS — S32000A Wedge compression fracture of unspecified lumbar vertebra, initial encounter for closed fracture: Secondary | ICD-10-CM

## 2012-06-23 DIAGNOSIS — M5430 Sciatica, unspecified side: Secondary | ICD-10-CM

## 2012-06-23 MED ORDER — HYDROCODONE-ACETAMINOPHEN 5-325 MG PO TABS: 1.0000 | ORAL_TABLET | ORAL | Status: DC | PRN

## 2012-06-23 NOTE — Patient Instructions (Addendum)
L5-S1 ESI with Dr Eduard Clos (or [facility])

## 2012-06-23 NOTE — Progress Notes (Signed)
Chief Complaint  Patient presents with  . Follow-up    Recheck lumbar compression fracture DOI 05/28/12    BP 106/70  Ht 5\' 6"  (1.676 m)  Wt 220 lb (99.791 kg)  BMI 35.53 kg/m2  1. Sciatica  HYDROcodone-acetaminophen (NORCO/VICODIN) 5-325 MG per tablet   HYDROcodone-acetaminophen (NORCO/VICODIN) 5-325 MG per tablet  2. Lumbar compression fracture  HYDROcodone-acetaminophen (NORCO/VICODIN) 5-325 MG per tablet   HYDROcodone-acetaminophen (NORCO/VICODIN) 5-325 MG per tablet    Current Outpatient Prescriptions on File Prior to Visit  Medication Sig Dispense Refill  . acidophilus (RISAQUAD) CAPS Take 1 capsule by mouth daily.      Marland Kitchen amLODipine (NORVASC) 5 MG tablet Take 5 mg by mouth daily.      Marland Kitchen atorvastatin (LIPITOR) 20 MG tablet Take 20 mg by mouth daily.      Marland Kitchen azithromycin (ZITHROMAX) 250 MG tablet Take 250 mg by mouth daily. 2 tabs on day 1, 1 tab thereafter until gone.      . cholecalciferol (VITAMIN D) 1000 UNITS tablet Take 1,000 Units by mouth daily.      Marland Kitchen FLUoxetine (PROZAC) 20 MG capsule Take 20 mg by mouth daily.      . fluticasone (FLONASE) 50 MCG/ACT nasal spray Place 2 sprays into the nose daily.      Marland Kitchen gabapentin (NEURONTIN) 100 MG capsule Take 1 capsule (100 mg total) by mouth 3 (three) times daily.  60 capsule  1  . hydrochlorothiazide (HYDRODIURIL) 25 MG tablet Take 25 mg by mouth daily.      Marland Kitchen HYDROcodone-acetaminophen (NORCO/VICODIN) 5-325 MG per tablet       . methocarbamol (ROBAXIN) 500 MG tablet Take 1 tablet (500 mg total) by mouth 3 (three) times daily.  60 tablet  1  . Misc Natural Products (OSTEO BI-FLEX ADV DOUBLE ST PO) Take 1 tablet by mouth daily.      . Multiple Vitamin (MULTIVITAMIN WITH MINERALS) TABS Take 1 tablet by mouth daily.      Marland Kitchen omeprazole (PRILOSEC) 40 MG capsule Take 40 mg by mouth daily.      Marland Kitchen oxyCODONE-acetaminophen (ROXICET) 5-325 MG per tablet Take 1 tablet by mouth every 4 (four) hours as needed for pain.  42 tablet  0  . predniSONE  (STERAPRED UNI-PAK) 10 MG tablet Take 1 tablet (10 mg total) by mouth daily. DS 12 days dose pack  10 tablet  1  . traMADol (ULTRAM) 50 MG tablet       . HYDROmorphone (DILAUDID) 2 MG tablet Take 1 tablet (2 mg total) by mouth every 4 (four) hours as needed for pain.  30 tablet  0  . promethazine (PHENERGAN) 25 MG tablet Take 1 tablet (25 mg total) by mouth every 6 (six) hours as needed for nausea.  20 tablet  0   No current facility-administered medications on file prior to visit.    The patient is complaining of some upper GI discomfort which is chronic for her she is on omeprazole she is asked to see her physician regarding this she complains of continued lower back pain and mild discomfort over the fracture her symptoms primarily in the lower back bilaterally with no leg pain no leg numbness no perineal numbness and no urinary problems  Recommend epidural steroid injections L5-S1 for chronic lower back pain and sciatic symptoms.  Her lumbar fracture is stable.  Followup in 4 weeks

## 2012-06-25 ENCOUNTER — Other Ambulatory Visit: Payer: Self-pay | Admitting: *Deleted

## 2012-06-25 ENCOUNTER — Other Ambulatory Visit: Payer: Self-pay | Admitting: Orthopedic Surgery

## 2012-06-25 ENCOUNTER — Telehealth: Payer: Self-pay | Admitting: *Deleted

## 2012-06-25 DIAGNOSIS — IMO0002 Reserved for concepts with insufficient information to code with codable children: Secondary | ICD-10-CM

## 2012-06-25 DIAGNOSIS — M549 Dorsalgia, unspecified: Secondary | ICD-10-CM

## 2012-06-25 NOTE — Telephone Encounter (Signed)
Faxed referral and office notes to [facility] for ESI injections. Awaiting appointment.

## 2012-06-30 ENCOUNTER — Ambulatory Visit
Admission: RE | Admit: 2012-06-30 | Discharge: 2012-06-30 | Disposition: A | Discharge status: Home / Self Care | Payer: BC Managed Care – PPO | Source: Ambulatory Visit | Attending: Orthopedic Surgery | Admitting: Orthopedic Surgery

## 2012-06-30 DIAGNOSIS — M549 Dorsalgia, unspecified: Secondary | ICD-10-CM

## 2012-06-30 MED ORDER — METHYLPREDNISOLONE ACETATE 40 MG/ML INJ SUSP (RADIOLOG
120.0000 mg | Freq: Once | INTRAMUSCULAR | Status: AC
  Administered 2012-06-30: 120 mg via EPIDURAL

## 2012-06-30 MED ORDER — IOHEXOL 180 MG/ML  SOLN
1.0000 mL | Freq: Once | INTRAMUSCULAR | Status: AC | PRN
  Administered 2012-06-30: 1 mL via EPIDURAL

## 2012-07-07 ENCOUNTER — Telehealth: Payer: Self-pay | Admitting: *Deleted

## 2012-07-07 NOTE — Telephone Encounter (Signed)
i ll see her 3/11  Her options are 3rd injection or see neurosurgery for consult

## 2012-07-07 NOTE — Telephone Encounter (Signed)
Patient went for first ESI injection. States she was told by [facility] to call and report her progress to you and you would decide if she needed another injection.  She states that she can now sit in her recliner and lay in the bed without pain, however she still can not stand for more than one minute or walk any distance without feeling discomfort. She is still taking all her pain medicine you prescribed, which is effective, but she said she can feel when the 4 hours is up. She said she feel's the injection has helped some. Has follow up appointment with you on 07/22/12. Please Advise. [phone] pt's number.

## 2012-07-10 ENCOUNTER — Other Ambulatory Visit: Payer: Self-pay | Admitting: *Deleted

## 2012-07-10 MED ORDER — HYDROCODONE-ACETAMINOPHEN 5-325 MG PO TABS: 1.0000 | ORAL_TABLET | ORAL | Status: DC | PRN

## 2012-07-10 MED ORDER — METHOCARBAMOL 500 MG PO TABS: 500.0000 mg | ORAL_TABLET | Freq: Three times a day (TID) | ORAL | Status: DC

## 2012-07-22 ENCOUNTER — Other Ambulatory Visit: Payer: Self-pay | Admitting: *Deleted

## 2012-07-22 ENCOUNTER — Encounter: Payer: Self-pay | Admitting: Orthopedic Surgery

## 2012-07-22 ENCOUNTER — Ambulatory Visit (INDEPENDENT_AMBULATORY_CARE_PROVIDER_SITE_OTHER): Payer: BC Managed Care – PPO | Admitting: Orthopedic Surgery

## 2012-07-22 ENCOUNTER — Telehealth: Payer: Self-pay | Admitting: *Deleted

## 2012-07-22 VITALS — BP 120/80 | Ht 66.0 in | Wt 220.0 lb

## 2012-07-22 DIAGNOSIS — S32000D Wedge compression fracture of unspecified lumbar vertebra, subsequent encounter for fracture with routine healing: Secondary | ICD-10-CM

## 2012-07-22 MED ORDER — GABAPENTIN 100 MG PO CAPS: 100.0000 mg | ORAL_CAPSULE | Freq: Three times a day (TID) | ORAL | Status: DC

## 2012-07-22 NOTE — Progress Notes (Signed)
Patient ID: Ariel Meyer, female   DOB: 11-03-1950, 62 y.o.   MRN: [id number] Chief Complaint  Patient presents with  . Follow-up    s/p ESI injection    History the patient had a compression fracture and she has some underlying degenerative disc disease at L5-S1. She was treated with medication and epidural injection with no improvement. I think at this point I've offered her everything I can. We did not go to physical therapy because she is in too much pain to even try to do that. She is agreeable to setting a neurosurgeon for possible surgical intervention  System review she has no problems with bowel or bladder dysfunction  Although her pain is improved from her initial encounter she still has difficulty with routine activities she can't stand or sit comfortably she can't cook or clean she is having difficulty getting up and off of chairs  We will refill her medication today  History as follows Subjective:     Patient ID: Ariel Meyer, female    DOB: 25-Jul-1950, 62 y.o.   MRN: [id number] Chief Complaint   Patient presents with   .  Back Injury       L 1 compression fracture. DOI1-15-14.    She actually complains of bilateral lower back pain with a history of bilateral sciatica.  HPI  She says her pain is not at the fracture site but in the lower SI joints and left side of her lower back. She describes this as hip pain but denies groin pain or anterior thigh pain denies radicular symptoms denies saddle anesthesia denies bowel or bladder retention or incontinence   Review of Systems Patient response has been reviewed and is scanned as a scanned documents     Objective:    Physical Exam  Nursing note and vitals reviewed. Constitutional: She is oriented to person, place, and time. She appears well-developed and well-nourished. She appears distressed.  HENT:   Head: Normocephalic.  Neck: Normal range of motion. No JVD present. No tracheal deviation present.  Cardiovascular:  Normal rate and intact distal pulses.   Pulmonary/Chest: Effort normal.  Abdominal: Soft.  Musculoskeletal:       Cervical back: Normal.       Thoracic back: Normal.       Lumbar back: She exhibits decreased range of motion, tenderness, bony tenderness, pain and spasm. She exhibits no swelling, no edema, no deformity and no laceration.  Lymphadenopathy:    She has no cervical adenopathy.  Neurological: She is alert and oriented to person, place, and time. She has normal reflexes. She displays normal reflexes. She exhibits normal muscle tone. Coordination normal.  Skin: Skin is warm and dry. No rash noted. She is not diaphoretic. No erythema. No pallor.  Psychiatric: She has a normal mood and affect. Her behavior is normal. Judgment and thought content normal.           Assessment & Plan:   CT scan and plain films show the chisel L1 compression fracture without spinal impingement or stenosis  However she mainly complains of her sciatica on the left side in the lower back  Recommend oral prednisone Dosepak, gabapentin, Robaxin, Percocet for pain followup in one week to assess pain medication and her response to it  We discussed the possible kyphoplasty however she has no major symptoms at that level.

## 2012-07-22 NOTE — Telephone Encounter (Signed)
Faxed referral and office notes to [facility]. Awaiting appointment.

## 2012-07-22 NOTE — Patient Instructions (Addendum)
Refer to [facility]

## 2012-08-04 NOTE — Telephone Encounter (Signed)
Appointment with Dr. Alessandra Bevels 08/06/12. Patient aware.

## 2012-08-08 ENCOUNTER — Other Ambulatory Visit: Payer: Self-pay | Admitting: Neurosurgery

## 2012-08-08 NOTE — Pre-Procedure Instructions (Signed)
Ariel Meyer  08/08/2012   Your procedure is scheduled on:  08-12-2012  Report to [facility] at 12 noon.Take [place] Elevators to 3rd floor  Call this number if you have problems the morning of surgery: [phone]   Remember:   Do not eat food or drink liquids after midnight.   Take these medicines the morning of surgery with A SIP OF WATER: amlodipine(Norvasc),Prozac),Flonase,gabapentin(neurontin),pain medication as needed,Prilosec   Do not wear jewelry, make-up or nail polish.  Do not wear lotions, powders, or perfumes. You may wear deodorant.  Do not shave 48 hours prior to surgery. .  Do not bring valuables to the hospital.  Contacts, dentures or bridgework may not be worn into surgery.  Leave suitcase in the car. After surgery it may be brought to your room.   For patients admitted to the hospital, checkout time is 11:00 AM the day of discharge.   Patients discharged the day of surgery will not be allowed to drive home.    Special Instructions: Shower using CHG 2 nights before surgery and the night before surgery.  If you shower the day of surgery use CHG.  Use special wash - you have one bottle of CHG for all showers.  You should use approximately 1/3 of the bottle for each shower.      Please read over the following fact sheets that you were given: Pain Booklet and Surgical Site Infection Prevention

## 2012-08-11 ENCOUNTER — Encounter (HOSPITAL_COMMUNITY)
Admission: RE | Admit: 2012-08-11 | Discharge: 2012-08-11 | Disposition: A | Discharge status: Home / Self Care | Payer: BC Managed Care – PPO | Source: Ambulatory Visit | Attending: Neurosurgery | Admitting: Neurosurgery

## 2012-08-11 ENCOUNTER — Ambulatory Visit (HOSPITAL_COMMUNITY)
Admission: RE | Admit: 2012-08-11 | Discharge: 2012-08-11 | Disposition: A | Discharge status: Home / Self Care | Payer: BC Managed Care – PPO | Source: Ambulatory Visit | Attending: Anesthesiology | Admitting: Anesthesiology

## 2012-08-11 ENCOUNTER — Encounter (HOSPITAL_COMMUNITY): Payer: Self-pay

## 2012-08-11 HISTORY — DX: Other seasonal allergic rhinitis: J30.2

## 2012-08-11 HISTORY — DX: Adverse effect of unspecified anesthetic, initial encounter: T41.45XA

## 2012-08-11 HISTORY — DX: Unspecified osteoarthritis, unspecified site: M19.90

## 2012-08-11 HISTORY — DX: Dizziness and giddiness: R42

## 2012-08-11 HISTORY — DX: Other complications of anesthesia, initial encounter: T88.59XA

## 2012-08-11 HISTORY — DX: Gastro-esophageal reflux disease without esophagitis: K21.9

## 2012-08-11 LAB — PROTIME-INR: Prothrombin Time: 12.8 seconds (ref 11.6–15.2)

## 2012-08-11 LAB — CBC WITH DIFFERENTIAL/PLATELET
Basophils Relative: 0 % (ref 0–1)
Hemoglobin: 13.3 g/dL (ref 12.0–15.0)
Lymphs Abs: 2.1 10*3/uL (ref 0.7–4.0)
Monocytes Relative: 8 % (ref 3–12)
Neutro Abs: 5.4 10*3/uL (ref 1.7–7.7)
Neutrophils Relative %: 64 % (ref 43–77)
RBC: 3.87 MIL/uL (ref 3.87–5.11)
WBC: 8.4 10*3/uL (ref 4.0–10.5)

## 2012-08-11 LAB — URINALYSIS, ROUTINE W REFLEX MICROSCOPIC
Glucose, UA: NEGATIVE mg/dL
Hgb urine dipstick: NEGATIVE
Specific Gravity, Urine: 1.031 — ABNORMAL HIGH (ref 1.005–1.030)
Urobilinogen, UA: 1 mg/dL (ref 0.0–1.0)

## 2012-08-11 LAB — BASIC METABOLIC PANEL
GFR calc Af Amer: 59 mL/min — ABNORMAL LOW (ref >90)
GFR calc non Af Amer: 51 mL/min — ABNORMAL LOW (ref >90)
Potassium: 3.7 mEq/L (ref 3.5–5.1)
Sodium: 143 mEq/L (ref 135–145)

## 2012-08-11 LAB — URINE MICROSCOPIC-ADD ON

## 2012-08-11 LAB — APTT: aPTT: 33 seconds (ref 24–37)

## 2012-08-11 MED ORDER — VANCOMYCIN HCL 10 G IV SOLR
1500.0000 mg | INTRAVENOUS | Status: AC
  Administered 2012-08-12: 1500 mg via INTRAVENOUS
  Filled 2012-08-11: qty 1500

## 2012-08-11 NOTE — Progress Notes (Signed)
Requested Stress Test from [facility] in [facility]

## 2012-08-12 ENCOUNTER — Encounter (HOSPITAL_COMMUNITY): Payer: Self-pay | Admitting: *Deleted

## 2012-08-12 ENCOUNTER — Ambulatory Visit (HOSPITAL_COMMUNITY): Payer: BC Managed Care – PPO

## 2012-08-12 ENCOUNTER — Encounter (HOSPITAL_COMMUNITY)
Admission: RE | Disposition: A | Discharge status: Home / Self Care | Payer: Self-pay | Source: Ambulatory Visit | Attending: Neurosurgery

## 2012-08-12 ENCOUNTER — Ambulatory Visit (HOSPITAL_COMMUNITY): Payer: BC Managed Care – PPO | Admitting: Certified Registered Nurse Anesthetist

## 2012-08-12 ENCOUNTER — Observation Stay (HOSPITAL_COMMUNITY)
Admission: RE | Admit: 2012-08-12 | Discharge: 2012-08-12 | Disposition: A | Discharge status: Home / Self Care | Payer: BC Managed Care – PPO | Source: Ambulatory Visit | Attending: Neurosurgery | Admitting: Neurosurgery

## 2012-08-12 ENCOUNTER — Encounter (HOSPITAL_COMMUNITY): Payer: Self-pay | Admitting: Certified Registered Nurse Anesthetist

## 2012-08-12 DIAGNOSIS — Z01818 Encounter for other preprocedural examination: Secondary | ICD-10-CM | POA: Insufficient documentation

## 2012-08-12 DIAGNOSIS — M8448XA Pathological fracture, other site, initial encounter for fracture: Principal | ICD-10-CM | POA: Insufficient documentation

## 2012-08-12 DIAGNOSIS — Z01810 Encounter for preprocedural cardiovascular examination: Secondary | ICD-10-CM | POA: Insufficient documentation

## 2012-08-12 DIAGNOSIS — I10 Essential (primary) hypertension: Secondary | ICD-10-CM | POA: Insufficient documentation

## 2012-08-12 DIAGNOSIS — Z01812 Encounter for preprocedural laboratory examination: Secondary | ICD-10-CM | POA: Insufficient documentation

## 2012-08-12 HISTORY — PX: KYPHOPLASTY: SHX5884

## 2012-08-12 SURGERY — KYPHOPLASTY
Anesthesia: General | Site: Spine Lumbar | Wound class: Clean

## 2012-08-12 MED ORDER — PROPOFOL 10 MG/ML IV BOLUS
INTRAVENOUS | Status: DC | PRN
  Administered 2012-08-12: 110 mg via INTRAVENOUS

## 2012-08-12 MED ORDER — DOCUSATE SODIUM 100 MG PO CAPS: 100.0000 mg | ORAL_CAPSULE | Freq: Two times a day (BID) | ORAL | Status: DC

## 2012-08-12 MED ORDER — ROCURONIUM BROMIDE 100 MG/10ML IV SOLN
INTRAVENOUS | Status: DC | PRN
  Administered 2012-08-12: 50 mg via INTRAVENOUS

## 2012-08-12 MED ORDER — ACETAMINOPHEN 650 MG RE SUPP: 650.0000 mg | RECTAL | Status: DC | PRN

## 2012-08-12 MED ORDER — OXYCODONE HCL 5 MG/5ML PO SOLN: 5.0000 mg | Freq: Once | ORAL | Status: DC | PRN

## 2012-08-12 MED ORDER — HYDROCHLOROTHIAZIDE 25 MG PO TABS
25.0000 mg | ORAL_TABLET | Freq: Every day | ORAL | Status: DC
  Filled 2012-08-12: qty 1

## 2012-08-12 MED ORDER — EPHEDRINE SULFATE 50 MG/ML IJ SOLN
INTRAMUSCULAR | Status: DC | PRN
  Administered 2012-08-12: 10 mg via INTRAVENOUS
  Administered 2012-08-12: 15 mg via INTRAVENOUS

## 2012-08-12 MED ORDER — VITAMIN D3 25 MCG (1000 UNIT) PO TABS
1000.0000 [IU] | ORAL_TABLET | Freq: Every day | ORAL | Status: DC
  Filled 2012-08-12: qty 1

## 2012-08-12 MED ORDER — PROMETHAZINE HCL 25 MG/ML IJ SOLN: 12.5000 mg | INTRAMUSCULAR | Status: DC | PRN

## 2012-08-12 MED ORDER — 0.9 % SODIUM CHLORIDE (POUR BTL) OPTIME
TOPICAL | Status: DC | PRN
  Administered 2012-08-12: 1000 mL

## 2012-08-12 MED ORDER — FLUOXETINE HCL 20 MG PO CAPS
20.0000 mg | ORAL_CAPSULE | Freq: Every day | ORAL | Status: DC
  Filled 2012-08-12: qty 1

## 2012-08-12 MED ORDER — SODIUM CHLORIDE 0.9 % IJ SOLN: 3.0000 mL | INTRAMUSCULAR | Status: DC | PRN

## 2012-08-12 MED ORDER — AMLODIPINE BESYLATE 5 MG PO TABS
5.0000 mg | ORAL_TABLET | Freq: Every day | ORAL | Status: DC
  Filled 2012-08-12: qty 1

## 2012-08-12 MED ORDER — MEPERIDINE HCL 25 MG/ML IJ SOLN: 6.2500 mg | INTRAMUSCULAR | Status: DC | PRN

## 2012-08-12 MED ORDER — ATORVASTATIN CALCIUM 20 MG PO TABS
20.0000 mg | ORAL_TABLET | Freq: Every day | ORAL | Status: DC
  Filled 2012-08-12: qty 1

## 2012-08-12 MED ORDER — CEFAZOLIN SODIUM 1-5 GM-% IV SOLN
1.0000 g | Freq: Three times a day (TID) | INTRAVENOUS | Status: DC
  Filled 2012-08-12 (×2): qty 50

## 2012-08-12 MED ORDER — FLUTICASONE PROPIONATE 50 MCG/ACT NA SUSP
2.0000 | Freq: Every day | NASAL | Status: DC
  Filled 2012-08-12: qty 16

## 2012-08-12 MED ORDER — MAGNESIUM HYDROXIDE 400 MG/5ML PO SUSP: 30.0000 mL | Freq: Every day | ORAL | Status: DC | PRN

## 2012-08-12 MED ORDER — DICLOFENAC SODIUM 75 MG PO TBEC
75.0000 mg | DELAYED_RELEASE_TABLET | Freq: Two times a day (BID) | ORAL | Status: DC
  Filled 2012-08-12: qty 1

## 2012-08-12 MED ORDER — OXYCODONE-ACETAMINOPHEN 5-325 MG PO TABS: 1.0000 | ORAL_TABLET | ORAL | Status: DC | PRN

## 2012-08-12 MED ORDER — LACTATED RINGERS IV SOLN
INTRAVENOUS | Status: DC | PRN
  Administered 2012-08-12 (×2): via INTRAVENOUS

## 2012-08-12 MED ORDER — MIDAZOLAM HCL 5 MG/5ML IJ SOLN
INTRAMUSCULAR | Status: DC | PRN
  Administered 2012-08-12: 2 mg via INTRAVENOUS

## 2012-08-12 MED ORDER — LIDOCAINE HCL (CARDIAC) 20 MG/ML IV SOLN
INTRAVENOUS | Status: DC | PRN
  Administered 2012-08-12: 100 mg via INTRAVENOUS

## 2012-08-12 MED ORDER — ADULT MULTIVITAMIN W/MINERALS CH
1.0000 | ORAL_TABLET | Freq: Every day | ORAL | Status: DC
  Filled 2012-08-12: qty 1

## 2012-08-12 MED ORDER — METHOCARBAMOL 500 MG PO TABS: 500.0000 mg | ORAL_TABLET | Freq: Three times a day (TID) | ORAL | Status: DC

## 2012-08-12 MED ORDER — OXYCODONE HCL 5 MG PO TABS: 5.0000 mg | ORAL_TABLET | Freq: Once | ORAL | Status: DC | PRN

## 2012-08-12 MED ORDER — PROMETHAZINE HCL 25 MG PO TABS: 12.5000 mg | ORAL_TABLET | ORAL | Status: DC | PRN

## 2012-08-12 MED ORDER — LIDOCAINE-EPINEPHRINE 1 %-1:100000 IJ SOLN
INTRAMUSCULAR | Status: DC | PRN
  Administered 2012-08-12: 10 mL

## 2012-08-12 MED ORDER — IOHEXOL 300 MG/ML  SOLN
INTRAMUSCULAR | Status: DC | PRN
  Administered 2012-08-12: 50 mL

## 2012-08-12 MED ORDER — HYDROMORPHONE HCL PF 1 MG/ML IJ SOLN
INTRAMUSCULAR | Status: AC
  Filled 2012-08-12: qty 1

## 2012-08-12 MED ORDER — MORPHINE SULFATE 2 MG/ML IJ SOLN: 1.0000 mg | INTRAMUSCULAR | Status: DC | PRN

## 2012-08-12 MED ORDER — GABAPENTIN 100 MG PO CAPS
100.0000 mg | ORAL_CAPSULE | Freq: Three times a day (TID) | ORAL | Status: DC
  Filled 2012-08-12 (×2): qty 1

## 2012-08-12 MED ORDER — FENTANYL CITRATE 0.05 MG/ML IJ SOLN
INTRAMUSCULAR | Status: DC | PRN
  Administered 2012-08-12: 125 ug via INTRAVENOUS

## 2012-08-12 MED ORDER — LACTATED RINGERS IV SOLN: INTRAVENOUS | Status: DC

## 2012-08-12 MED ORDER — SODIUM CHLORIDE 0.9 % IJ SOLN: 3.0000 mL | Freq: Two times a day (BID) | INTRAMUSCULAR | Status: DC

## 2012-08-12 MED ORDER — HYDROMORPHONE HCL PF 1 MG/ML IJ SOLN
0.2500 mg | INTRAMUSCULAR | Status: DC | PRN
  Administered 2012-08-12: 0.5 mg via INTRAVENOUS

## 2012-08-12 MED ORDER — RISAQUAD PO CAPS
1.0000 | ORAL_CAPSULE | Freq: Every day | ORAL | Status: DC
  Filled 2012-08-12: qty 1

## 2012-08-12 MED ORDER — TRAMADOL HCL 50 MG PO TABS
50.0000 mg | ORAL_TABLET | Freq: Four times a day (QID) | ORAL | Status: DC | PRN
  Filled 2012-08-12: qty 1

## 2012-08-12 MED ORDER — ONDANSETRON HCL 4 MG/2ML IJ SOLN
INTRAMUSCULAR | Status: DC | PRN
  Administered 2012-08-12: 4 mg via INTRAVENOUS

## 2012-08-12 MED ORDER — ACETAMINOPHEN 325 MG PO TABS: 650.0000 mg | ORAL_TABLET | ORAL | Status: DC | PRN

## 2012-08-12 MED ORDER — HYDROCODONE-ACETAMINOPHEN 5-325 MG PO TABS: 1.0000 | ORAL_TABLET | ORAL | Status: DC | PRN

## 2012-08-12 MED ORDER — ONDANSETRON HCL 4 MG/2ML IJ SOLN: 4.0000 mg | INTRAMUSCULAR | Status: DC | PRN

## 2012-08-12 MED ORDER — ONDANSETRON HCL 4 MG/2ML IJ SOLN: 4.0000 mg | Freq: Once | INTRAMUSCULAR | Status: DC | PRN

## 2012-08-12 MED ORDER — PANTOPRAZOLE SODIUM 40 MG PO TBEC: 80.0000 mg | DELAYED_RELEASE_TABLET | Freq: Every day | ORAL | Status: DC

## 2012-08-12 SURGICAL SUPPLY — 35 items
BANDAGE ADHESIVE 1X3 (GAUZE/BANDAGES/DRESSINGS) ×2 IMPLANT
BLADE SURG 15 STRL LF DISP TIS (BLADE) ×1 IMPLANT
BLADE SURG 15 STRL SS (BLADE) ×1
BLADE SURG ROTATE 9660 (MISCELLANEOUS) IMPLANT
CEMENT BONE KYPHX HV R (Orthopedic Implant) ×2 IMPLANT
CLOTH BEACON ORANGE TIMEOUT ST (SAFETY) ×2 IMPLANT
CONT SPEC 4OZ CLIKSEAL STRL BL (MISCELLANEOUS) ×4 IMPLANT
DERMABOND ADHESIVE PROPEN (GAUZE/BANDAGES/DRESSINGS) ×1
DERMABOND ADVANCED .7 DNX6 (GAUZE/BANDAGES/DRESSINGS) ×1 IMPLANT
DEVICE BIOPSY BONE KYPHX (INSTRUMENTS) ×2 IMPLANT
DRAPE C-ARM 42X72 X-RAY (DRAPES) ×2 IMPLANT
DRAPE LAPAROTOMY 100X72X124 (DRAPES) ×2 IMPLANT
DRAPE PROXIMA HALF (DRAPES) IMPLANT
DURAPREP 26ML APPLICATOR (WOUND CARE) ×2 IMPLANT
GAUZE SPONGE 4X4 16PLY XRAY LF (GAUZE/BANDAGES/DRESSINGS) ×2 IMPLANT
GLOVE ECLIPSE 7.5 STRL STRAW (GLOVE) ×2 IMPLANT
GLOVE EXAM NITRILE LRG STRL (GLOVE) IMPLANT
GLOVE EXAM NITRILE MD LF STRL (GLOVE) IMPLANT
GLOVE EXAM NITRILE XL STR (GLOVE) IMPLANT
GLOVE EXAM NITRILE XS STR PU (GLOVE) IMPLANT
GOWN BRE IMP SLV AUR LG STRL (GOWN DISPOSABLE) ×2 IMPLANT
GOWN STRL REIN 2XL LVL4 (GOWN DISPOSABLE) IMPLANT
KIT BASIN OR (CUSTOM PROCEDURE TRAY) ×2 IMPLANT
KIT ROOM TURNOVER OR (KITS) ×2 IMPLANT
MEDTRONIC SOFAMOR DANEK ×2 IMPLANT
NEEDLE HYPO 25X1 1.5 SAFETY (NEEDLE) ×2 IMPLANT
NS IRRIG 1000ML POUR BTL (IV SOLUTION) ×2 IMPLANT
PACK SURGICAL SETUP 50X90 (CUSTOM PROCEDURE TRAY) ×2 IMPLANT
PAD ARMBOARD 7.5X6 YLW CONV (MISCELLANEOUS) ×6 IMPLANT
SUT VIC AB 3-0 SH 8-18 (SUTURE) ×2 IMPLANT
SYR CONTROL 10ML LL (SYRINGE) ×2 IMPLANT
TOWEL OR 17X24 6PK STRL BLUE (TOWEL DISPOSABLE) ×2 IMPLANT
TOWEL OR 17X26 10 PK STRL BLUE (TOWEL DISPOSABLE) ×2 IMPLANT
TRAY KYPHOPAK 20/3 ONESTEP 1ST (MISCELLANEOUS) ×2 IMPLANT
WATER STERILE IRR 1000ML POUR (IV SOLUTION) ×2 IMPLANT

## 2012-08-12 NOTE — Anesthesia Postprocedure Evaluation (Signed)
Anesthesia Post Note  Patient: Ariel Meyer  Procedure(s) Performed: Procedure(s) (LRB): KYPHOPLASTY (N/A)  Anesthesia type: general  Patient location: PACU  Post pain: Pain level controlled  Post assessment: Patient's Cardiovascular Status Stable  Last Vitals:  Filed Vitals:   08/12/12 1539  BP:   Pulse: 86  Temp:   Resp: 18    Post vital signs: Reviewed and stable  Level of consciousness: sedated  Complications: No apparent anesthesia complications

## 2012-08-12 NOTE — Discharge Summary (Signed)
Physician Discharge Summary  Patient ID: Ariel Meyer MRN: [id number] DOB/AGE: 12-10-1950 62 y.o.  Admit date: 08/12/2012 Discharge date: 08/12/2012  Admission Diagnoses:L1 compression fracture   Discharge Diagnoses: L1 compression fracture  Active Problems:   * No active hospital problems. *   Discharged Condition: good  Hospital Course: pt admitted on day of surgery and underwent procedure below.  Pt doing well, ambulating  Consults: None  Significant Diagnostic Studies: none  Treatments: surgery: L1 balloon KYPHOPLASTY with methylmethacrylate, flouroscopy   Discharge Exam: Blood pressure 114/78, pulse 108, temperature 97.7 F (36.5 C), temperature source Oral, resp. rate 18, SpO2 96.00%. Wound:c/d/i  Disposition: home     Medication List    TAKE these medications       acidophilus Caps  Take 1 capsule by mouth daily.     amLODipine 5 MG tablet  Commonly known as:  NORVASC  Take 5 mg by mouth daily.     atorvastatin 20 MG tablet  Commonly known as:  LIPITOR  Take 20 mg by mouth daily.     cholecalciferol 1000 UNITS tablet  Commonly known as:  VITAMIN D  Take 1,000 Units by mouth daily.     diclofenac 75 MG EC tablet  Commonly known as:  VOLTAREN  Take 75 mg by mouth 2 (two) times daily.     FLUoxetine 20 MG capsule  Commonly known as:  PROZAC  Take 20 mg by mouth daily.     fluticasone 50 MCG/ACT nasal spray  Commonly known as:  FLONASE  Place 2 sprays into the nose daily.     gabapentin 100 MG capsule  Commonly known as:  NEURONTIN  Take 1 capsule (100 mg total) by mouth 3 (three) times daily.     hydrochlorothiazide 25 MG tablet  Commonly known as:  HYDRODIURIL  Take 25 mg by mouth daily.     HYDROcodone-acetaminophen 5-325 MG per tablet  Commonly known as:  NORCO/VICODIN  Take 1 tablet by mouth every 4 (four) hours as needed for pain.     methocarbamol 500 MG tablet  Commonly known as:  ROBAXIN  Take 1 tablet (500 mg total) by mouth 3  (three) times daily.     multivitamin with minerals Tabs  Take 1 tablet by mouth daily.     omeprazole 40 MG capsule  Commonly known as:  PRILOSEC  Take 40 mg by mouth daily.     traMADol 50 MG tablet  Commonly known as:  ULTRAM  Take 50 mg by mouth every 6 (six) hours as needed for pain.         SignedClydene Fake, MD 08/12/2012, 3:04 PM

## 2012-08-12 NOTE — Interval H&P Note (Signed)
History and Physical Interval Note:  08/12/2012 1:28 PM  Ariel Meyer  has presented today for surgery, with the diagnosis of Pathologic Fracture, Lumbago  The various methods of treatment have been discussed with the patient and family. After consideration of risks, benefits and other options for treatment, the patient has consented to  Procedure(s) with comments: KYPHOPLASTY (N/A) - L1 Balloon Kyphoplasty as a surgical intervention .  The patient's history has been reviewed, patient examined, no change in status, stable for surgery.  I have reviewed the patient's chart and labs.  Questions were answered to the patient's satisfaction.     Keagen Heinlen R

## 2012-08-12 NOTE — Plan of Care (Signed)
Problem: Consults Goal: Diagnosis - Spinal Surgery Outcome: Completed/Met Date Met:  08/12/12 L1 BALLOON KYPHOPLASTY

## 2012-08-12 NOTE — Op Note (Signed)
08/12/2012  2:57 PM  PATIENT:  Ariel Meyer  62 y.o. female  PRE-OPERATIVE DIAGNOSIS: L1 compression fracture  POST-OPERATIVE DIAGNOSIS: L1 compression fracture  PROCEDURE:  Procedure(s): L1 balloon KYPHOPLASTY with methylmethacrylate, flouroscopy  SURGEON:  Surgeon(s): Clydene Fake, MD    ANESTHESIA:   general  EBL:   minimal  BLOOD ADMINISTERED:none  DRAINS: none   SPECIMEN:  Core Needle Biopsy   DICTATION: Patient with back pain and L1 compression fracture. Fracture or tingling she continues to have pain despite adequate conservative management after much discussion X-rays and MRI decided to proceed with kyphoplasty.  Patient brought into operating room general anesthesia induced patient placed in a prone position and fluoroscopy was set up so we can see the spine fracture in 2 planes patient prepped draped sterile fashion and sent incision inject with 10 cc 1% lidocaine with epinephrine stab incision was made left-sided a bony working port was and to placed onto the pedicle on the left sides and using the fluoroscopy in 2 planes this a needle entered the pedicle and down into the vertebral body. The center was removed leaving working ports and a core biopsy was taken of the L1 vertebral body and sent to pathology. We then used to drill to drill the vertebral body in the kyphoplasty balloon was put into position. Slowly reinflated the bone watching it in 2 planes with fluoroscopy as we elevated the superior endplate the left side going towards the the midline. It was then deflated and removed and the L1 vertebral body with methylmethacrylate again watching the injection under fluoroscopy. 4 cc of methylmethacrylate injected. We removed the working port took final AP and lateral fluoroscopic images showing adequate kyphoplasty L1 vertebral body. Incision was closed with 2 through Vicryl since this sutures skin closed with Dermabond dry Band-Aid was placed patient placed back in  spine position woken from anesthesia and transferred recovery.  PLAN OF CARE: Admit for overnight observation  PATIENT DISPOSITION:  PACU - hemodynamically stable.

## 2012-08-12 NOTE — Progress Notes (Signed)
Pt. discharged home accompanied by husband. Prescriptions and discharge instructions given with verbalization of understanding. Incision site on back with no s/s of infection - no swelling, redness, bleeding, and/or drainage noted. Opportunity given to ask questions but no question asked. Pt. transported out of this unit in wheelchair by the nurse tech.

## 2012-08-12 NOTE — Transfer of Care (Signed)
Immediate Anesthesia Transfer of Care Note  Patient: Ariel Meyer  Procedure(s) Performed: Procedure(s) with comments: KYPHOPLASTY (N/A) - L1 Balloon Kyphoplasty  Patient Location: PACU  Anesthesia Type:General  Level of Consciousness: awake, alert  and oriented  Airway & Oxygen Therapy: Patient Spontanous Breathing and Patient connected to nasal cannula oxygen  Post-op Assessment: Report given to PACU RN, Post -op Vital signs reviewed and stable and Patient moving all extremities  Post vital signs: Reviewed and stable  Complications: No apparent anesthesia complications

## 2012-08-12 NOTE — Plan of Care (Signed)
Problem: Consults Goal: Diagnosis - Spinal Surgery Outcome: Completed/Met Date Met:  08/12/12 Cervical Spine Fusion     

## 2012-08-12 NOTE — Anesthesia Procedure Notes (Signed)
Procedure Name: Intubation Date/Time: 08/12/2012 1:51 PM Performed by: Rogelia Boga Pre-anesthesia Checklist: Patient identified, Emergency Drugs available, Suction available, Patient being monitored and Timeout performed Patient Re-evaluated:Patient Re-evaluated prior to inductionOxygen Delivery Method: Circle system utilized Preoxygenation: Pre-oxygenation with 100% oxygen Intubation Type: IV induction Ventilation: Mask ventilation without difficulty Laryngoscope Size: Mac and 4 Grade View: Grade II Tube type: Oral Tube size: 7.5 mm Number of attempts: 1 Airway Equipment and Method: Stylet Placement Confirmation: ETT inserted through vocal cords under direct vision,  positive ETCO2 and breath sounds checked- equal and bilateral Secured at: 22 cm Tube secured with: Tape Dental Injury: Teeth and Oropharynx as per pre-operative assessment

## 2012-08-12 NOTE — Anesthesia Preprocedure Evaluation (Addendum)
Anesthesia Evaluation  Patient identified by MRN, date of birth, ID band Patient awake    Reviewed: Allergy & Precautions, H&P , NPO status , Patient's Chart, lab work & pertinent test results  Airway Mallampati: II TM Distance: >3 FB Neck ROM: Full    Dental  (+) Dental Advisory Given   Pulmonary          Cardiovascular hypertension, Pt. on medications     Neuro/Psych    GI/Hepatic GERD-  Medicated and Controlled,  Endo/Other  Morbid obesity  Renal/GU      Musculoskeletal   Abdominal   Peds  Hematology   Anesthesia Other Findings   Reproductive/Obstetrics                          Anesthesia Physical Anesthesia Plan  ASA: II  Anesthesia Plan: General   Post-op Pain Management:    Induction: Intravenous  Airway Management Planned: Oral ETT  Additional Equipment:   Intra-op Plan:   Post-operative Plan: Extubation in OR  Informed Consent: I have reviewed the patients History and Physical, chart, labs and discussed the procedure including the risks, benefits and alternatives for the proposed anesthesia with the patient or authorized representative who has indicated his/her understanding and acceptance.   Dental advisory given  Plan Discussed with: CRNA, Surgeon and Anesthesiologist  Anesthesia Plan Comments:        Anesthesia Quick Evaluation

## 2012-08-12 NOTE — H&P (Signed)
See H& P.

## 2012-08-13 LAB — URINE CULTURE: Colony Count: >=100000

## 2012-08-14 ENCOUNTER — Encounter (HOSPITAL_COMMUNITY): Payer: Self-pay | Admitting: Neurosurgery

## 2012-08-27 ENCOUNTER — Telehealth: Payer: Self-pay | Admitting: *Deleted

## 2012-08-27 NOTE — Telephone Encounter (Signed)
Refill request for Methocarbamol 500 mg. [facility] [place]

## 2012-08-29 ENCOUNTER — Other Ambulatory Visit: Payer: Self-pay | Admitting: Orthopedic Surgery

## 2012-08-29 DIAGNOSIS — S32000D Wedge compression fracture of unspecified lumbar vertebra, subsequent encounter for fracture with routine healing: Secondary | ICD-10-CM

## 2012-08-29 DIAGNOSIS — M5430 Sciatica, unspecified side: Secondary | ICD-10-CM

## 2012-08-29 MED ORDER — METHOCARBAMOL 500 MG PO TABS: 500.0000 mg | ORAL_TABLET | Freq: Three times a day (TID) | ORAL | Status: DC

## 2012-09-29 ENCOUNTER — Other Ambulatory Visit: Payer: Self-pay | Admitting: Family Medicine

## 2012-09-29 DIAGNOSIS — M8448XA Pathological fracture, other site, initial encounter for fracture: Secondary | ICD-10-CM

## 2012-10-13 ENCOUNTER — Other Ambulatory Visit: Payer: Self-pay | Admitting: *Deleted

## 2012-10-13 DIAGNOSIS — M5430 Sciatica, unspecified side: Secondary | ICD-10-CM

## 2012-10-13 DIAGNOSIS — S32000D Wedge compression fracture of unspecified lumbar vertebra, subsequent encounter for fracture with routine healing: Secondary | ICD-10-CM

## 2012-10-13 MED ORDER — METHOCARBAMOL 500 MG PO TABS: 500.0000 mg | ORAL_TABLET | Freq: Three times a day (TID) | ORAL | Status: DC

## 2012-12-16 ENCOUNTER — Other Ambulatory Visit: Payer: Self-pay | Admitting: *Deleted

## 2012-12-16 DIAGNOSIS — M5430 Sciatica, unspecified side: Secondary | ICD-10-CM

## 2012-12-16 DIAGNOSIS — S32000D Wedge compression fracture of unspecified lumbar vertebra, subsequent encounter for fracture with routine healing: Secondary | ICD-10-CM

## 2012-12-16 MED ORDER — METHOCARBAMOL 500 MG PO TABS: 500.0000 mg | ORAL_TABLET | Freq: Three times a day (TID) | ORAL | Status: DC

## 2013-10-19 ENCOUNTER — Other Ambulatory Visit: Payer: Self-pay | Admitting: Internal Medicine

## 2013-10-19 DIAGNOSIS — Z1231 Encounter for screening mammogram for malignant neoplasm of breast: Secondary | ICD-10-CM

## 2013-10-19 DIAGNOSIS — N644 Mastodynia: Secondary | ICD-10-CM

## 2013-10-23 ENCOUNTER — Ambulatory Visit
Admission: RE | Admit: 2013-10-23 | Discharge: 2013-10-23 | Disposition: A | Discharge status: Home / Self Care | Payer: BC Managed Care – PPO | Source: Ambulatory Visit | Attending: Internal Medicine | Admitting: Internal Medicine

## 2013-10-23 ENCOUNTER — Encounter (INDEPENDENT_AMBULATORY_CARE_PROVIDER_SITE_OTHER): Payer: Self-pay | Admitting: General Surgery

## 2013-10-23 ENCOUNTER — Ambulatory Visit (INDEPENDENT_AMBULATORY_CARE_PROVIDER_SITE_OTHER): Payer: BC Managed Care – PPO | Admitting: General Surgery

## 2013-10-23 VITALS — BP 144/96 | HR 72 | Temp 97.1°F | Resp 16

## 2013-10-23 DIAGNOSIS — N649 Disorder of breast, unspecified: Secondary | ICD-10-CM

## 2013-10-23 DIAGNOSIS — N644 Mastodynia: Secondary | ICD-10-CM

## 2013-10-23 DIAGNOSIS — N6489 Other specified disorders of breast: Secondary | ICD-10-CM

## 2013-10-23 NOTE — Progress Notes (Signed)
Patient ID: Ariel Meyer, female   DOB: 09/25/1950, 63 y.o.   MRN: [id number]  Chief Complaint  Patient presents with  . Breast Problem    new pt- eval vascular area of breast    HPI Ariel Meyer is a 63 y.o. female.  Referred by Dr Anselmo Picklerandy Jackson HPI 89 yof who one week ago noted a growth on the right nipple that has increased in size. No drainage. No prior history.  No nipple discharge.  Underwent us that us below.  Referred for evaluation.  Past Medical History  Diagnosis Date  . Hypertension   . Back problem   . Complication of anesthesia     wakes up durning surgery  . GERD (gastroesophageal reflux disease)   . Arthritis   . Seasonal allergies   . Dizziness   . Hyperlipidemia     Past Surgical History  Procedure Laterality Date  . Tubal ligation    . Nasal sinus surgery    . Tonsillectomy    . Kyphoplasty N/A 08/12/2012    Procedure: KYPHOPLASTY;  Surgeon: Clydene FakeJames R Hirsch, MD;  Location: [facility];  Service: Neurosurgery;  Laterality: N/A;  L1 Balloon Kyphoplasty  . Back surgery      Family History  Problem Relation Age of Onset  . Heart disease    . Arthritis      Social History History  Substance Use Topics  . Smoking status: Former Smoker -- 1.00 packs/day for 3 years    Types: Cigarettes    Quit date: 05/14/2009  . Smokeless tobacco: Not on file  . Alcohol Use: 7.0 oz/week    14 drink(s) per week    Allergies  Allergen Reactions  . Sudafed [Pseudoephedrine Hcl] Other (See Comments)    Raises blood pressure  . Sulfa Antibiotics Other (See Comments)    Inflames her mucous membranes  . Acetaminophen Other (See Comments)    Due to liver  . Macrodantin [Nitrofurantoin Macrocrystal] Nausea And Vomiting and Other (See Comments)    Passes out   . Macrodantin [Nitrofurantoin] Rash  . Penicillins Rash    Current Outpatient Prescriptions  Medication Sig Dispense Refill  . acidophilus (RISAQUAD) CAPS Take 1 capsule by mouth daily.      Marland Kitchen. alendronate  (FOSAMAX) 70 MG tablet       . amLODipine (NORVASC) 5 MG tablet Take 5 mg by mouth daily.      Marland Kitchen. atorvastatin (LIPITOR) 20 MG tablet Take 20 mg by mouth daily.      . cholecalciferol (VITAMIN D) 1000 UNITS tablet Take 1,000 Units by mouth daily.      . clindamycin (CLEOCIN) 300 MG capsule       . diclofenac (VOLTAREN) 75 MG EC tablet Take 75 mg by mouth 2 (two) times daily.      Marland Kitchen. FLUoxetine (PROZAC) 20 MG capsule Take 20 mg by mouth daily.      . fluticasone (FLONASE) 50 MCG/ACT nasal spray Place 2 sprays into the nose daily.      Marland Kitchen. gabapentin (NEURONTIN) 100 MG capsule Take 1 capsule (100 mg total) by mouth 3 (three) times daily.  60 capsule  1  . hydrochlorothiazide (HYDRODIURIL) 25 MG tablet Take 25 mg by mouth daily.      Marland Kitchen. HYDROcodone-acetaminophen (NORCO/VICODIN) 5-325 MG per tablet Take 1 tablet by mouth every 4 (four) hours as needed for pain.  60 tablet  1  . methocarbamol (ROBAXIN) 500 MG tablet Take 1 tablet (500 mg total) by mouth  3 (three) times daily.  60 tablet  1  . Multiple Vitamin (MULTIVITAMIN WITH MINERALS) TABS Take 1 tablet by mouth daily.      . nabumetone (RELAFEN) 500 MG tablet       . omeprazole (PRILOSEC) 40 MG capsule Take 40 mg by mouth daily.      . traMADol (ULTRAM) 50 MG tablet Take 50 mg by mouth every 6 (six) hours as needed for pain.        No current facility-administered medications for this visit.    Review of Systems Review of Systems  Constitutional: Negative for fever, chills and unexpected weight change.  HENT: Negative for congestion, hearing loss, sore throat, trouble swallowing and voice change.   Eyes: Negative for visual disturbance.  Respiratory: Negative for cough and wheezing.   Cardiovascular: Negative for chest pain, palpitations and leg swelling.  Gastrointestinal: Negative for nausea, vomiting, abdominal pain, diarrhea, constipation, blood in stool, abdominal distention and anal bleeding.  Genitourinary: Negative for hematuria,  vaginal bleeding and difficulty urinating.  Musculoskeletal: Negative for arthralgias.  Skin: Negative for rash and wound.  Neurological: Negative for seizures, syncope and headaches.  Hematological: Negative for adenopathy. Does not bruise/bleed easily.  Psychiatric/Behavioral: Negative for confusion.    Blood pressure 144/96, pulse 72, temperature 97.1 F (36.2 C), temperature source Temporal, resp. rate 16.  Physical Exam Physical Exam  Vitals reviewed. Constitutional: She appears well-developed and well-nourished.  Cardiovascular: Normal rate, regular rhythm and normal heart sounds.   Pulmonary/Chest: Effort normal and breath sounds normal. Right breast exhibits mass. Right breast exhibits no inverted nipple, no nipple discharge, no skin change and no tenderness. Left breast exhibits no inverted nipple, no mass, no nipple discharge, no skin change and no tenderness.    Lymphadenopathy:    She has no cervical adenopathy.    She has no axillary adenopathy.       Right: No supraclavicular adenopathy present.       Left: No supraclavicular adenopathy present.    Data Reviewed EXAM:  DIGITAL DIAGNOSTIC bilateral MAMMOGRAM WITH CAD  ULTRASOUND right BREAST  COMPARISON: 06/11/2011.  ACR Breast Density Category b: There are scattered areas of  fibroglandular density.  FINDINGS:  The right nipple appears mildly prominent when compare with the  prior study. There is no breast mass, distortion, or worrisome  calcification within either breast.  Mammographic images were processed with CAD.  On physical exam, there is a small (8 mm) erythematous nodule  associated with the superior portion of the right nipple. There is  no ulceration or crusting associated with the nipple. There is no  palpable subareolar right breast mass.  Ultrasound is performed, showing a round, circumscribed mass  associated with the superior portion the right nipple which appears  vascular on ultrasound. This  measures 10 x 10 x 9 mm in size. This  may represent a developing nipple abscess versus a vascular mass  associated with the right nipple. I recommend surgical consultation  for possible punch biopsy if this area does not resolve. Patient  [facility]  IMPRESSION:  10 mm round mass associated with the superior portion right nipple  as discussed above. Surgical consultation has been arranged as  discussed above.  RECOMMENDATION:  Surgical consultation.    Assessment    Right nipple mass     Plan    I think this will be benign with history and appearance. She will complete abx although I  dont think this is infection. She is going to put hot compresses on it and take nsaids for discomfort. If this is no better next week will do punch biopsy.  I will see her again next week        [facility] 10/23/2013, 7:24 PM

## 2013-10-29 ENCOUNTER — Encounter (INDEPENDENT_AMBULATORY_CARE_PROVIDER_SITE_OTHER): Payer: Self-pay | Admitting: General Surgery

## 2013-10-29 ENCOUNTER — Other Ambulatory Visit (INDEPENDENT_AMBULATORY_CARE_PROVIDER_SITE_OTHER): Payer: Self-pay | Admitting: General Surgery

## 2013-10-29 ENCOUNTER — Ambulatory Visit (INDEPENDENT_AMBULATORY_CARE_PROVIDER_SITE_OTHER): Payer: BC Managed Care – PPO | Admitting: General Surgery

## 2013-10-29 VITALS — BP 125/85 | HR 93 | Temp 98.0°F | Resp 16 | Ht 69.0 in | Wt 219.8 lb

## 2013-10-29 DIAGNOSIS — N6489 Other specified disorders of breast: Secondary | ICD-10-CM

## 2013-10-29 DIAGNOSIS — N649 Disorder of breast, unspecified: Secondary | ICD-10-CM

## 2013-10-29 DIAGNOSIS — L723 Sebaceous cyst: Secondary | ICD-10-CM

## 2013-10-29 NOTE — Progress Notes (Signed)
Subjective:     Patient ID: Ariel Meyer, female   DOB: 09/26/1950, 63 y.o.   MRN: [id number]  HPI 63 yof who follows up for left nipple lesion that is still present.  This has drained some since our last visit. Still tender, on abx still.  Review of Systems     Objective:   Physical Exam Right nipple lesion with a scab some drainage present, ? crusting    Assessment:     Right nipple lesion     Plan:     I think this is benign but she is concerned. Discussed a punch biopsy. I cleansed with chlorprep, anesthetized with lidocaine then did a 4 mm punch. There is actually some purulence present so I left this open.dressing was placed. I will have her come back in 2 weeks.will call her with the pathology.

## 2013-11-04 ENCOUNTER — Telehealth (INDEPENDENT_AMBULATORY_CARE_PROVIDER_SITE_OTHER): Payer: Self-pay

## 2013-11-04 NOTE — Telephone Encounter (Signed)
Called pt to notify her the path is benign per Dr Dwain SarnaWakefield. Pt understands.

## 2013-11-17 ENCOUNTER — Encounter (INDEPENDENT_AMBULATORY_CARE_PROVIDER_SITE_OTHER): Payer: Self-pay | Admitting: General Surgery

## 2013-11-17 ENCOUNTER — Ambulatory Visit (INDEPENDENT_AMBULATORY_CARE_PROVIDER_SITE_OTHER): Payer: BC Managed Care – PPO | Admitting: General Surgery

## 2013-11-17 VITALS — BP 132/82 | HR 74 | Resp 16 | Ht 69.0 in | Wt 215.0 lb

## 2013-11-17 DIAGNOSIS — Z09 Encounter for follow-up examination after completed treatment for conditions other than malignant neoplasm: Secondary | ICD-10-CM

## 2013-11-17 NOTE — Progress Notes (Signed)
Subjective:     Patient ID: Ariel Meyer, female   DOB: 05/29/1950, 63 y.o.   MRN: [id number]  HPI  [age] yof who presented with right nipple lesion.  I did punch biopsy and became clear this was a cystic lesion.  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  She has now healed and nipple is normal after healing by secondary intention  Review of Systems     Objective:   Physical Exam Right nipple without abnormality    Assessment:     S/p right nipple biopsy     Plan:     We discussed pathology today.  This has healed.  She can see me as needed

## 2014-01-13 ENCOUNTER — Other Ambulatory Visit: Payer: Self-pay | Admitting: Physical Medicine and Rehabilitation

## 2014-01-13 DIAGNOSIS — M545 Low back pain, unspecified: Secondary | ICD-10-CM

## 2014-01-23 ENCOUNTER — Ambulatory Visit
Admission: RE | Admit: 2014-01-23 | Discharge: 2014-01-23 | Disposition: A | Discharge status: Home / Self Care | Payer: BC Managed Care – PPO | Source: Ambulatory Visit | Attending: Physical Medicine and Rehabilitation | Admitting: Physical Medicine and Rehabilitation

## 2014-01-23 DIAGNOSIS — M545 Low back pain, unspecified: Secondary | ICD-10-CM

## 2014-01-26 ENCOUNTER — Ambulatory Visit
Admission: RE | Admit: 2014-01-26 | Discharge: 2014-01-26 | Disposition: A | Discharge status: Home / Self Care | Payer: BC Managed Care – PPO | Source: Ambulatory Visit | Attending: Physical Medicine and Rehabilitation | Admitting: Physical Medicine and Rehabilitation

## 2014-01-26 ENCOUNTER — Other Ambulatory Visit: Payer: Self-pay | Admitting: Physical Medicine and Rehabilitation

## 2014-01-26 DIAGNOSIS — M25561 Pain in right knee: Secondary | ICD-10-CM

## 2014-01-26 DIAGNOSIS — M25562 Pain in left knee: Principal | ICD-10-CM

## 2014-02-07 ENCOUNTER — Encounter (HOSPITAL_COMMUNITY): Payer: Self-pay | Admitting: Emergency Medicine

## 2014-02-07 ENCOUNTER — Emergency Department (HOSPITAL_COMMUNITY): Payer: BC Managed Care – PPO

## 2014-02-07 ENCOUNTER — Inpatient Hospital Stay (HOSPITAL_COMMUNITY)
Admission: EM | Admit: 2014-02-07 | Discharge: 2014-02-12 | DRG: 372 | Disposition: A | Discharge status: Home / Self Care | Payer: BC Managed Care – PPO | Attending: Internal Medicine | Admitting: Internal Medicine

## 2014-02-07 DIAGNOSIS — A047 Enterocolitis due to Clostridium difficile: Secondary | ICD-10-CM | POA: Diagnosis present

## 2014-02-07 DIAGNOSIS — N289 Disorder of kidney and ureter, unspecified: Secondary | ICD-10-CM

## 2014-02-07 DIAGNOSIS — E1121 Type 2 diabetes mellitus with diabetic nephropathy: Secondary | ICD-10-CM | POA: Diagnosis present

## 2014-02-07 DIAGNOSIS — E785 Hyperlipidemia, unspecified: Secondary | ICD-10-CM | POA: Diagnosis present

## 2014-02-07 DIAGNOSIS — R188 Other ascites: Secondary | ICD-10-CM

## 2014-02-07 DIAGNOSIS — J018 Other acute sinusitis: Secondary | ICD-10-CM

## 2014-02-07 DIAGNOSIS — I9589 Other hypotension: Secondary | ICD-10-CM

## 2014-02-07 DIAGNOSIS — Z6835 Body mass index (BMI) 35.0-35.9, adult: Secondary | ICD-10-CM

## 2014-02-07 DIAGNOSIS — E669 Obesity, unspecified: Secondary | ICD-10-CM | POA: Diagnosis present

## 2014-02-07 DIAGNOSIS — K219 Gastro-esophageal reflux disease without esophagitis: Secondary | ICD-10-CM | POA: Diagnosis present

## 2014-02-07 DIAGNOSIS — R112 Nausea with vomiting, unspecified: Secondary | ICD-10-CM | POA: Diagnosis present

## 2014-02-07 DIAGNOSIS — N179 Acute kidney failure, unspecified: Secondary | ICD-10-CM | POA: Diagnosis present

## 2014-02-07 DIAGNOSIS — R197 Diarrhea, unspecified: Secondary | ICD-10-CM

## 2014-02-07 DIAGNOSIS — E1129 Type 2 diabetes mellitus with other diabetic kidney complication: Secondary | ICD-10-CM | POA: Diagnosis present

## 2014-02-07 DIAGNOSIS — I10 Essential (primary) hypertension: Secondary | ICD-10-CM | POA: Diagnosis present

## 2014-02-07 DIAGNOSIS — Z87891 Personal history of nicotine dependence: Secondary | ICD-10-CM

## 2014-02-07 DIAGNOSIS — A0472 Enterocolitis due to Clostridium difficile, not specified as recurrent: Secondary | ICD-10-CM | POA: Diagnosis present

## 2014-02-07 DIAGNOSIS — J019 Acute sinusitis, unspecified: Secondary | ICD-10-CM | POA: Clinically undetermined

## 2014-02-07 DIAGNOSIS — I959 Hypotension, unspecified: Secondary | ICD-10-CM | POA: Diagnosis present

## 2014-02-07 LAB — CBC WITH DIFFERENTIAL/PLATELET
BASOS PCT: 0 % (ref 0–1)
Basophils Absolute: 0 10*3/uL (ref 0.0–0.1)
Eosinophils Absolute: 0.3 10*3/uL (ref 0.0–0.7)
Eosinophils Relative: 2 % (ref 0–5)
HCT: 45.3 % (ref 36.0–46.0)
HEMOGLOBIN: 15.6 g/dL — AB (ref 12.0–15.0)
LYMPHS ABS: 2.4 10*3/uL (ref 0.7–4.0)
Lymphocytes Relative: 20 % (ref 12–46)
MCH: 33.5 pg (ref 26.0–34.0)
MCHC: 34.4 g/dL (ref 30.0–36.0)
MCV: 97.2 fL (ref 78.0–100.0)
MONOS PCT: 8 % (ref 3–12)
Monocytes Absolute: 1 10*3/uL (ref 0.1–1.0)
NEUTROS ABS: 8.5 10*3/uL — AB (ref 1.7–7.7)
NEUTROS PCT: 70 % (ref 43–77)
PLATELETS: 221 10*3/uL (ref 150–400)
RBC: 4.66 MIL/uL (ref 3.87–5.11)
RDW: 12.2 % (ref 11.5–15.5)
WBC: 12.1 10*3/uL — ABNORMAL HIGH (ref 4.0–10.5)

## 2014-02-07 LAB — COMPREHENSIVE METABOLIC PANEL
ALT: 35 U/L (ref 0–35)
AST: 43 U/L — AB (ref 0–37)
Albumin: 3.3 g/dL — ABNORMAL LOW (ref 3.5–5.2)
Alkaline Phosphatase: 91 U/L (ref 39–117)
Anion gap: 19 — ABNORMAL HIGH (ref 5–15)
BUN: 30 mg/dL — ABNORMAL HIGH (ref 6–23)
CALCIUM: 8.4 mg/dL (ref 8.4–10.5)
CO2: 21 mEq/L (ref 19–32)
Chloride: 96 mEq/L (ref 96–112)
Creatinine, Ser: 3.11 mg/dL — ABNORMAL HIGH (ref 0.50–1.10)
GFR calc Af Amer: 17 mL/min — ABNORMAL LOW (ref >90)
GFR, EST NON AFRICAN AMERICAN: 15 mL/min — AB (ref >90)
Glucose, Bld: 139 mg/dL — ABNORMAL HIGH (ref 70–99)
Potassium: 3.8 mEq/L (ref 3.7–5.3)
SODIUM: 136 meq/L — AB (ref 137–147)
TOTAL PROTEIN: 6.9 g/dL (ref 6.0–8.3)
Total Bilirubin: 0.8 mg/dL (ref 0.3–1.2)

## 2014-02-07 LAB — URINALYSIS, ROUTINE W REFLEX MICROSCOPIC
GLUCOSE, UA: NEGATIVE mg/dL
Hgb urine dipstick: NEGATIVE
KETONES UR: 15 mg/dL — AB
NITRITE: NEGATIVE
PH: 5 (ref 5.0–8.0)
PROTEIN: 30 mg/dL — AB
Specific Gravity, Urine: 1.021 (ref 1.005–1.030)
Urobilinogen, UA: 0.2 mg/dL (ref 0.0–1.0)

## 2014-02-07 LAB — LACTIC ACID, PLASMA: Lactic Acid, Venous: 1.1 mmol/L (ref 0.5–2.2)

## 2014-02-07 LAB — GLUCOSE, CAPILLARY: Glucose-Capillary: 122 mg/dL — ABNORMAL HIGH (ref 70–99)

## 2014-02-07 LAB — URINE MICROSCOPIC-ADD ON

## 2014-02-07 LAB — MRSA PCR SCREENING: MRSA by PCR: NEGATIVE

## 2014-02-07 LAB — LIPASE, BLOOD: Lipase: 18 U/L (ref 11–59)

## 2014-02-07 MED ORDER — PANTOPRAZOLE SODIUM 40 MG IV SOLR
40.0000 mg | Freq: Once | INTRAVENOUS | Status: AC
  Administered 2014-02-07: 40 mg via INTRAVENOUS
  Filled 2014-02-07: qty 40

## 2014-02-07 MED ORDER — ACETAMINOPHEN 325 MG PO TABS
650.0000 mg | ORAL_TABLET | Freq: Four times a day (QID) | ORAL | Status: DC | PRN
  Administered 2014-02-09 – 2014-02-12 (×5): 650 mg via ORAL
  Filled 2014-02-07 (×6): qty 2

## 2014-02-07 MED ORDER — MORPHINE SULFATE 2 MG/ML IJ SOLN
2.0000 mg | Freq: Once | INTRAMUSCULAR | Status: AC
  Administered 2014-02-07: 2 mg via INTRAVENOUS
  Filled 2014-02-07: qty 1

## 2014-02-07 MED ORDER — ONDANSETRON HCL 4 MG/2ML IJ SOLN
4.0000 mg | Freq: Once | INTRAMUSCULAR | Status: AC
  Administered 2014-02-07: 4 mg via INTRAVENOUS
  Filled 2014-02-07: qty 2

## 2014-02-07 MED ORDER — SODIUM CHLORIDE 0.9 % IV BOLUS (SEPSIS)
500.0000 mL | Freq: Once | INTRAVENOUS | Status: AC
  Administered 2014-02-07: 500 mL via INTRAVENOUS

## 2014-02-07 MED ORDER — SODIUM CHLORIDE 0.9 % IV BOLUS (SEPSIS)
1000.0000 mL | Freq: Once | INTRAVENOUS | Status: AC
  Administered 2014-02-07: 1000 mL via INTRAVENOUS

## 2014-02-07 MED ORDER — ONDANSETRON HCL 4 MG PO TABS
4.0000 mg | ORAL_TABLET | Freq: Four times a day (QID) | ORAL | Status: DC | PRN
  Administered 2014-02-12 (×2): 4 mg via ORAL
  Filled 2014-02-07 (×2): qty 1

## 2014-02-07 MED ORDER — METRONIDAZOLE 500 MG PO TABS
500.0000 mg | ORAL_TABLET | Freq: Three times a day (TID) | ORAL | Status: DC
  Administered 2014-02-08 – 2014-02-12 (×15): 500 mg via ORAL
  Filled 2014-02-07 (×19): qty 1

## 2014-02-07 MED ORDER — SODIUM CHLORIDE 0.9 % IJ SOLN
3.0000 mL | Freq: Two times a day (BID) | INTRAMUSCULAR | Status: DC
  Administered 2014-02-09 – 2014-02-12 (×6): 3 mL via INTRAVENOUS

## 2014-02-07 MED ORDER — ALUM & MAG HYDROXIDE-SIMETH 200-200-20 MG/5ML PO SUSP
30.0000 mL | Freq: Four times a day (QID) | ORAL | Status: DC | PRN
  Administered 2014-02-07 – 2014-02-10 (×2): 30 mL via ORAL
  Filled 2014-02-07 (×2): qty 30

## 2014-02-07 MED ORDER — FLUOXETINE HCL 40 MG PO CAPS: 40.0000 mg | ORAL_CAPSULE | Freq: Every day | ORAL | Status: DC

## 2014-02-07 MED ORDER — SODIUM CHLORIDE 0.9 % IV SOLN
INTRAVENOUS | Status: AC
  Administered 2014-02-07: 17:00:00 via INTRAVENOUS

## 2014-02-07 MED ORDER — ONDANSETRON HCL 4 MG/2ML IJ SOLN
2.0000 mg | Freq: Once | INTRAMUSCULAR | Status: AC
  Administered 2014-02-07: 2 mg via INTRAVENOUS
  Filled 2014-02-07: qty 2

## 2014-02-07 MED ORDER — METRONIDAZOLE IN NACL 5-0.79 MG/ML-% IV SOLN
500.0000 mg | Freq: Three times a day (TID) | INTRAVENOUS | Status: DC
  Administered 2014-02-07: 500 mg via INTRAVENOUS
  Filled 2014-02-07 (×2): qty 100

## 2014-02-07 MED ORDER — ACETAMINOPHEN 650 MG RE SUPP: 650.0000 mg | Freq: Four times a day (QID) | RECTAL | Status: DC | PRN

## 2014-02-07 MED ORDER — MORPHINE SULFATE 4 MG/ML IJ SOLN
4.0000 mg | Freq: Once | INTRAMUSCULAR | Status: AC
  Administered 2014-02-07: 4 mg via INTRAVENOUS
  Filled 2014-02-07: qty 1

## 2014-02-07 MED ORDER — INSULIN ASPART 100 UNIT/ML ~~LOC~~ SOLN
0.0000 [IU] | Freq: Three times a day (TID) | SUBCUTANEOUS | Status: DC
  Administered 2014-02-11: 1 [IU] via SUBCUTANEOUS

## 2014-02-07 MED ORDER — MORPHINE SULFATE 2 MG/ML IJ SOLN
1.0000 mg | INTRAMUSCULAR | Status: DC | PRN
  Administered 2014-02-07 – 2014-02-08 (×7): 2 mg via INTRAVENOUS
  Administered 2014-02-08: 1 mg via INTRAVENOUS
  Administered 2014-02-09 (×2): 2 mg via INTRAVENOUS
  Filled 2014-02-07 (×10): qty 1

## 2014-02-07 MED ORDER — FLUOXETINE HCL 20 MG PO CAPS
40.0000 mg | ORAL_CAPSULE | Freq: Every day | ORAL | Status: DC
  Administered 2014-02-08 – 2014-02-12 (×5): 40 mg via ORAL
  Filled 2014-02-07 (×5): qty 2

## 2014-02-07 MED ORDER — HEPARIN SODIUM (PORCINE) 5000 UNIT/ML IJ SOLN
5000.0000 [IU] | Freq: Three times a day (TID) | INTRAMUSCULAR | Status: DC
  Administered 2014-02-07 – 2014-02-12 (×14): 5000 [IU] via SUBCUTANEOUS
  Filled 2014-02-07 (×17): qty 1

## 2014-02-07 MED ORDER — ONDANSETRON HCL 4 MG/2ML IJ SOLN
4.0000 mg | Freq: Four times a day (QID) | INTRAMUSCULAR | Status: DC | PRN
  Administered 2014-02-07 – 2014-02-10 (×3): 4 mg via INTRAVENOUS
  Filled 2014-02-07 (×3): qty 2

## 2014-02-07 MED ORDER — MORPHINE SULFATE 2 MG/ML IJ SOLN: 2.0000 mg | Freq: Once | INTRAMUSCULAR | Status: DC

## 2014-02-07 MED ORDER — SODIUM CHLORIDE 0.9 % IV SOLN
INTRAVENOUS | Status: DC
  Administered 2014-02-08: 01:00:00 via INTRAVENOUS

## 2014-02-07 MED ORDER — SODIUM CHLORIDE 0.9 % IV SOLN
INTRAVENOUS | Status: DC
  Administered 2014-02-07: 125 mL/h via INTRAVENOUS
  Administered 2014-02-08 (×2): via INTRAVENOUS
  Administered 2014-02-09: 10 mL/h via INTRAVENOUS

## 2014-02-07 MED ORDER — FENTANYL CITRATE 0.05 MG/ML IJ SOLN
50.0000 ug | Freq: Once | INTRAMUSCULAR | Status: AC
  Administered 2014-02-07: 50 ug via INTRAVENOUS
  Filled 2014-02-07: qty 2

## 2014-02-07 MED ORDER — IOHEXOL 300 MG/ML  SOLN
25.0000 mL | INTRAMUSCULAR | Status: AC
  Administered 2014-02-07: 25 mL via ORAL

## 2014-02-07 NOTE — ED Provider Notes (Signed)
CSN: [id number]     Arrival date & time 02/07/14  4098 History   First MD Initiated Contact with Patient 02/07/14 [id number]     Chief Complaint  Patient presents with  . Abdominal Pain  . Diarrhea     (Consider location/radiation/quality/duration/timing/severity/associated sxs/prior Treatment) HPI Patient to the ER with complaints of abdominal pain diffuse to the abdomen and worse to the LLQ and RUQ quadrant. The pain started around 2:30 am after she woke up to eat a ham and cheese sandwich. Her stomach has started to bloat, she had two large, non odorous, non bloody bowel movements since the pain started. Her is waxing and waning. She has a hx of c.dif, denies that this feels anything like it. Has not had fevers, nausea or vomiting. She saw her PCP on  Thursday, was feeling fine and told she had normal bowel sounds.   Past Medical History  Diagnosis Date  . Hypertension   . Back problem   . Complication of anesthesia     wakes up durning surgery  . GERD (gastroesophageal reflux disease)   . Arthritis   . Seasonal allergies   . Dizziness   . Hyperlipidemia    Past Surgical History  Procedure Laterality Date  . Tubal ligation    . Nasal sinus surgery    . Tonsillectomy    . Kyphoplasty N/A 08/12/2012    Procedure: KYPHOPLASTY;  Surgeon: Clydene Fake, MD;  Location: [facility];  Service: Neurosurgery;  Laterality: N/A;  L1 Balloon Kyphoplasty  . Back surgery     Family History  Problem Relation Age of Onset  . Heart disease    . Arthritis     History  Substance Use Topics  . Smoking status: Former Smoker -- 1.00 packs/day for 3 years    Types: Cigarettes    Quit date: 05/14/2009  . Smokeless tobacco: Not on file  . Alcohol Use: 7.0 oz/week    14 drink(s) per week   OB History   Grav Para Term Preterm Abortions TAB SAB Ect Mult Living                 Review of Systems   Review of Systems  Gen: no weight loss, fevers, chills, night sweats  Eyes: no occular  draining, occular pain,  No visual changes  Nose: no epistaxis or rhinorrhea  Mouth: no dental pain, no sore throat  Neck: no neck pain  Lungs: No hemoptysis. No wheezing or coughing CV:  No palpitations, dependent edema or orthopnea. No chest pain Abd: no diarrhea. No nausea or vomiting, + abdominal pain  GU: no dysuria or gross hematuria  MSK:  No muscle weakness, No muscular pain Neuro: no headache, no focal neurologic deficits  Skin: no rash , no wounds Psyche: no complaints of depression or anxiety    Allergies  Dilaudid; Norvasc; Sudafed; Sudafed 12 hour; Sulfa antibiotics; Acetaminophen; Macrodantin; Macrodantin; and Penicillins  Home Medications   Prior to Admission medications   Medication Sig Start Date End Date Taking? Authorizing Provider  alendronate (FOSAMAX) 70 MG tablet Take 70 mg by mouth once a week. Take with a full glass of water on an empty stomach.   Yes Historical Provider, MD  atorvastatin (LIPITOR) 40 MG tablet Take 40 mg by mouth daily.   Yes Historical Provider, MD  cetirizine (ZYRTEC) 10 MG tablet Take 10 mg by mouth daily.   Yes Historical Provider, MD  cholecalciferol (VITAMIN D) 1000 UNITS tablet Take 1,000 Units  by mouth daily.   Yes Historical Provider, MD  FLUoxetine (PROZAC) 40 MG capsule Take 40 mg by mouth daily.   Yes Historical Provider, MD  hydrochlorothiazide (HYDRODIURIL) 25 MG tablet Take 25 mg by mouth daily.   Yes Historical Provider, MD  lisinopril (PRINIVIL,ZESTRIL) 10 MG tablet Take 10 mg by mouth daily.   Yes Historical Provider, MD  metFORMIN (GLUCOPHAGE) 500 MG tablet Take 500 mg by mouth daily.   Yes Historical Provider, MD  Multiple Vitamin (MULTIVITAMIN WITH MINERALS) TABS Take 1 tablet by mouth daily.   Yes Historical Provider, MD  nabumetone (RELAFEN) 500 MG tablet Take 500 mg by mouth 2 (two) times daily.  10/17/13  Yes Historical Provider, MD  omeprazole (PRILOSEC) 40 MG capsule Take 40 mg by mouth daily.   Yes Historical  Provider, MD   BP 101/69  Pulse 86  Temp(Src) 97.8 F (36.6 C) (Oral)  Resp 22  SpO2 100% Physical Exam  Nursing note and vitals reviewed. Constitutional: She appears well-developed and well-nourished. No distress.  HENT:  Head: Normocephalic and atraumatic.  Eyes: Pupils are equal, round, and reactive to light.  Neck: Normal range of motion. Neck supple.  Cardiovascular: Normal rate and regular rhythm.   Pulmonary/Chest: Effort normal and breath sounds normal.  Abdominal: Soft. She exhibits distension. She exhibits no pulsatile liver and no fluid wave. Bowel sounds are increased. There is tenderness in the right upper quadrant and left lower quadrant. There is guarding and positive Murphy's sign. There is no CVA tenderness. No hernia.  Neurological: She is alert.  Skin: Skin is warm and dry.    ED Course  Procedures (including critical care time) Labs Review Labs Reviewed  CBC WITH DIFFERENTIAL - Abnormal; Notable for the following:    WBC 12.1 (*)    Hemoglobin 15.6 (*)    Neutro Abs 8.5 (*)    All other components within normal limits  COMPREHENSIVE METABOLIC PANEL - Abnormal; Notable for the following:    Sodium 136 (*)    Glucose, Bld 139 (*)    BUN 30 (*)    Creatinine, Ser 3.11 (*)    Albumin 3.3 (*)    AST 43 (*)    GFR calc non Af Amer 15 (*)    GFR calc Af Amer 17 (*)    Anion gap 19 (*)    All other components within normal limits  URINALYSIS, ROUTINE W REFLEX MICROSCOPIC - Abnormal; Notable for the following:    Color, Urine AMBER (*)    APPearance CLOUDY (*)    Bilirubin Urine SMALL (*)    Ketones, ur 15 (*)    Protein, ur 30 (*)    Leukocytes, UA TRACE (*)    All other components within normal limits  URINE MICROSCOPIC-ADD ON - Abnormal; Notable for the following:    Squamous Epithelial / LPF MANY (*)    Bacteria, UA FEW (*)    All other components within normal limits  LIPASE, BLOOD  GI PATHOGEN PANEL BY PCR, STOOL  LACTIC ACID, PLASMA     Imaging Review Ct Abdomen Pelvis Wo Contrast  02/07/2014   CLINICAL DATA:  Abdominal pain.  Bloating, diarrhea.  EXAM: CT ABDOMEN AND PELVIS WITHOUT CONTRAST  TECHNIQUE: Multidetector CT imaging of the abdomen and pelvis was performed following the standard protocol without IV contrast.  COMPARISON:  10/24/2010,1 05/28/2012  FINDINGS: Lower chest: The lung bases are unremarkable in appearance. Heart size is normal. No pericardial effusion. No pleural effusions.  Upper  abdomen: There is ascites around the liver and spleen. No focal abnormality identified within the liver, spleen, pancreas, or adrenal glands. The kidneys have a normal appearance. There is layering high attenuation the within the gallbladder consistent with stones or sludge.  Bowel: The stomach has a normal appearance. Terminal ileum is notable for wall thickening and mild irregularity. This involves the distal 20 cm of the ileum. No associated and abscess or perforation. There are numerous colonic diverticula. No acute diverticulitis. The appendix is well seen and has a normal appearance.  Pelvis: The uterus is present. There is free pelvic fluid. No adnexal mass.  Retroperitoneum: No retroperitoneal or mesenteric adenopathy. No evidence for aortic aneurysm. There is atherosclerotic calcification of the abdominal aorta.  Abdominal wall: Unremarkable.  Osseous structures: Previous fracture and vertebroplasty at L1.  IMPRESSION: 1. Ascites. 2. Thickening and irregularity of the distal ileal loops, raising the question of Crohn's disease. 3. No evidence for abscess or free air. 4. Diverticulosis. 5. Layering sludge sludge or stones within the gallbladder. 6. Old fracture and vertebroplasty at L1.   Electronically Signed   By: Rosalie Gums M.D.   On: 02/07/2014 15:39     EKG Interpretation None      MDM   Final diagnoses:  Ascites  Renal insufficiency  Other specified hypotension    Patient reports having been told she had "problems  with her kidneys" last week. She was started on Fosamax. Starting yesterday she developed severe abdominal pain. Her her pain continues and she has abdominal distention with it.  Medications  0.9 %  sodium chloride infusion ( Intravenous Stopped 02/07/14 1212)  iohexol (OMNIPAQUE) 300 MG/ML solution 25 mL (25 mLs Oral Contrast Given 02/07/14 1128)  0.9 %  sodium chloride infusion (not administered)  sodium chloride 0.9 % bolus 500 mL (500 mLs Intravenous New Bag/Given 02/07/14 1600)  metroNIDAZOLE (FLAGYL) IVPB 500 mg (not administered)  pantoprazole (PROTONIX) injection 40 mg (not administered)  ondansetron (ZOFRAN) injection 4 mg (4 mg Intravenous Given 02/07/14 1042)  sodium chloride 0.9 % bolus 500 mL (0 mLs Intravenous Stopped 02/07/14 1044)  morphine 4 MG/ML injection 4 mg (4 mg Intravenous Given 02/07/14 1042)  sodium chloride 0.9 % bolus 1,000 mL (0 mLs Intravenous Stopped 02/07/14 1430)  morphine 2 MG/ML injection 2 mg (2 mg Intravenous Given 02/07/14 1225)  ondansetron (ZOFRAN) injection 2 mg (2 mg Intravenous Given 02/07/14 1224)  morphine 2 MG/ML injection 2 mg (2 mg Intravenous Given 02/07/14 1430)  fentaNYL (SUBLIMAZE) injection 50 mcg (50 mcg Intravenous Given 02/07/14 1600)    Her labs are very abnormal and I do not have any recent values to compare to.  Most notably her renal function is BUN of 30 and creatinine of 3.11. Her GFR is 15. In 2014, all of these values were normal. She has mildly elevated LFTs. She has diffuse mildly abnormal values in her CBC and CMP. Negative lipase.  Her CT scan shows Ascites, which we do not see on her scan from 05/2012. Thickening and irregularity of the distal loops- unknown etiology.  Layering sludge or possibly stones in the gallbladder.   Patient continuing to have significant pain and after IV pain medications her BP dropped to low 90's systolic. Will admit for further evaluation of abdominal pain and pain control.   4:19 pm- After discussing  case with [facility] she recommends lactate, starting IV metronidazole, GI stool panel. She has agreed to admit to [facility] for stepdown.  CRITICAL CARE Performed by:  Alanson Hausmann G Total critical care time: 45 Critical care time was exclusive of separately billable procedures and treating other patients. Critical care was necessary to treat or prevent imminent or life-threatening deterioration. Critical care was time spent personally by me on the following activities: development of treatment plan with patient and/or surrogate as well as nursing, discussions with consultants, evaluation of patient's response to treatment, examination of patient, obtaining history from patient or surrogate, ordering and performing treatments and interventions, ordering and review of laboratory studies, ordering and review of radiographic studies, pulse oximetry and re-evaluation of patient's condition.  Inpatient, [facility] hospital, stepdown.  Dorthula Matas, PA-C 02/07/14 1623  Dorthula Matas, PA-C 02/07/14 1626

## 2014-02-07 NOTE — ED Notes (Signed)
Pt is in a gown and on the monitor. 

## 2014-02-07 NOTE — ED Notes (Signed)
Patient transported to CT 

## 2014-02-07 NOTE — ED Notes (Signed)
Abd. Discomfort after lunch on 9/26; ate ham, fresh cheese, lettuce, [place] bread prior to symptoms. Ate fish the previous night.  Not taking antibiotics. Upper abd. Pain and is bloated. Last bm was on 9/26. Now having to wear depends b/c leakage.

## 2014-02-07 NOTE — H&P (Signed)
[facility] History and Physical  Scherrie Seneca ZOX:[id number] DOB: 02-06-1951 DOA: 02/07/2014  Referring physician: EDP PCP: Warrick Parisian, MD   Chief Complaint: abdominal pain  HPI: Ariel Meyer is a 63 y.o. female  Presents to ED with severe diffuse abdominal pain and bloating. Has had diarrhea on and off for a few weeks, worsened overnight to the point she wore adult diapers.  Had fever, chills last night. No vomiting. Appetite ok. Has h/o previous C diff colitis. Was on antibiotics a month or 2 ago for mastitis. Denies sick contacts. No recent travel. drinks well water.  Blood pressure in ED has ranged 74/41 - 109/66. Received 2 liters saline and currently systolic 109. Creatinine is 3.11. Previous creatinine about 1. WBC 12,000.  CT abdomen/pelvis shows ascites, thickening and irregularity of distal ileal loops, gallbladder sludge or stones. Has felt weak and dizzy with standing.   Review of Systems:  Systems reviewed. As above, otherwise negative  Past Medical History  Diagnosis Date  . Hypertension   . Back problem   . Complication of anesthesia     wakes up durning surgery  . GERD (gastroesophageal reflux disease)   . Arthritis   . Seasonal allergies   . Dizziness   . Hyperlipidemia    Past Surgical History  Procedure Laterality Date  . Tubal ligation    . Nasal sinus surgery    . Tonsillectomy    . Kyphoplasty N/A 08/12/2012    Procedure: KYPHOPLASTY;  Surgeon: Clydene Fake, MD;  Location: [facility];  Service: Neurosurgery;  Laterality: N/A;  L1 Balloon Kyphoplasty  . Back surgery     Social History:  reports that she quit smoking about 4 years ago. Her smoking use included Cigarettes. She has a 3 pack-year smoking history. She does not have any smokeless tobacco history on file. She reports that she drinks about 7 ounces of alcohol per week. She reports that she does not use illicit drugs.  Allergies  Allergen Reactions  . Dilaudid [Hydromorphone  Hcl]     hallucination  . Norvasc [Amlodipine Besylate] Swelling  . Sudafed 12 Hour [Pseudoephedrine Hcl Er] Hypertension  . Sudafed [Pseudoephedrine Hcl] Other (See Comments)    Raises blood pressure  . Sulfa Antibiotics Other (See Comments)    Inflames her mucous membranes  . Acetaminophen Other (See Comments)    Due to liver  . Macrodantin [Nitrofurantoin Macrocrystal] Nausea And Vomiting and Other (See Comments)    Passes out   . Macrodantin [Nitrofurantoin] Rash  . Penicillins Rash    Family History  Problem Relation Age of Onset  . Heart disease    . Arthritis       Prior to Admission medications   Medication Sig Start Date End Date Taking? Authorizing Provider  alendronate (FOSAMAX) 70 MG tablet Take 70 mg by mouth once a week. Take with a full glass of water on an empty stomach.   Yes Historical Provider, MD  atorvastatin (LIPITOR) 40 MG tablet Take 40 mg by mouth daily.   Yes Historical Provider, MD  cetirizine (ZYRTEC) 10 MG tablet Take 10 mg by mouth daily.   Yes Historical Provider, MD  cholecalciferol (VITAMIN D) 1000 UNITS tablet Take 1,000 Units by mouth daily.   Yes Historical Provider, MD  FLUoxetine (PROZAC) 40 MG capsule Take 40 mg by mouth daily.   Yes Historical Provider, MD  hydrochlorothiazide (HYDRODIURIL) 25 MG tablet Take 25 mg by mouth daily.   Yes Historical Provider, MD  lisinopril (PRINIVIL,ZESTRIL)  10 MG tablet Take 10 mg by mouth daily.   Yes Historical Provider, MD  metFORMIN (GLUCOPHAGE) 500 MG tablet Take 500 mg by mouth daily.   Yes Historical Provider, MD  Multiple Vitamin (MULTIVITAMIN WITH MINERALS) TABS Take 1 tablet by mouth daily.   Yes Historical Provider, MD  nabumetone (RELAFEN) 500 MG tablet Take 500 mg by mouth 2 (two) times daily.  10/17/13  Yes Historical Provider, MD  omeprazole (PRILOSEC) 40 MG capsule Take 40 mg by mouth daily.   Yes Historical Provider, MD   Physical Exam: Filed Vitals:   02/07/14 1525 02/07/14 1600 02/07/14  1642 02/07/14 1642  BP: 97/67 101/69  109/66  Pulse: 88 86  86  Temp:    98.3 F (36.8 C)  TempSrc:   Oral Oral  Resp: SpO2: 95% 100%  95%    Wt Readings from Last 3 Encounters:  11/17/13 97.523 kg (215 lb)  10/29/13 99.701 kg (219 lb 12.8 oz)  08/11/12 102.8 kg (226 lb 10.1 oz)  BP 109/66  Pulse 86  Temp(Src) 98.3 F (36.8 C) (Oral)  Resp 18  SpO2 95%  General Appearance:    Alert, cooperative, no distress, appears stated age  Head:    Normocephalic, without obvious abnormality, atraumatic  Eyes:    PERRL, conjunctiva/corneas clear, EOM's intact,   Nose:   Nares normal, septum midline, mucosa normal, no drainage    or sinus tenderness  Throat:   Lips, mucosa, and tongue normal; teeth and gums normal  Neck:   Supple, symmetrical, trachea midline, no adenopathy;    thyroid:  no enlargement/tenderness/nodules; no carotid   bruit or JVD  Back:     Symmetric, no curvature, ROM normal, no CVA tenderness  Lungs:     Clear to auscultation bilaterally, respirations unlabored  Chest Wall:    No tenderness or deformity   Heart:    Regular rate and rhythm, S1 and S2 normal, no murmur, rub   or gallop  Abdomen:     Soft, obese, diffuse tenderness. No rebound tenderness. Bowel sounds present  Genitalia:    deferred  Rectal:    deferred  Extremities:   Extremities normal, atraumatic, no cyanosis or edema  Pulses:   2+ and symmetric all extremities  Skin:   Skin color, texture, turgor normal, no rashes or lesions  Lymph nodes:   Cervical, supraclavicular, and axillary nodes normal  Neurologic:   CNII-XII intact, normal strength, sensation and reflexes    throughout    Psych:  Normal affect        Labs on Admission:  Basic Metabolic Panel:  Recent Labs Lab 02/07/14 1005  NA 136*  K 3.8  CL 96  CO2 21  GLUCOSE 139*  BUN 30*  CREATININE 3.11*  CALCIUM 8.4   Liver Function Tests:  Recent Labs Lab 02/07/14 1005  AST 43*  ALT 35  ALKPHOS 91  BILITOT 0.8    PROT 6.9  ALBUMIN 3.3*    Recent Labs Lab 02/07/14 1005  LIPASE 18   No results found for this basename: AMMONIA,  in the last 168 hours CBC:  Recent Labs Lab 02/07/14 1005  WBC 12.1*  NEUTROABS 8.5*  HGB 15.6*  HCT 45.3  MCV 97.2  PLT 221   Cardiac Enzymes: No results found for this basename: CKTOTAL, CKMB, CKMBINDEX, TROPONINI,  in the last 168 hours  BNP (last 3 results) No results found for this basename: PROBNP,  in the last  8760 hours CBG: No results found for this basename: GLUCAP,  in the last 168 hours  Radiological Exams on Admission: Ct Abdomen Pelvis Wo Contrast  02/07/2014   CLINICAL DATA:  Abdominal pain.  Bloating, diarrhea.  EXAM: CT ABDOMEN AND PELVIS WITHOUT CONTRAST  TECHNIQUE: Multidetector CT imaging of the abdomen and pelvis was performed following the standard protocol without IV contrast.  COMPARISON:  10/24/2010,1 05/28/2012  FINDINGS: Lower chest: The lung bases are unremarkable in appearance. Heart size is normal. No pericardial effusion. No pleural effusions.  Upper abdomen: There is ascites around the liver and spleen. No focal abnormality identified within the liver, spleen, pancreas, or adrenal glands. The kidneys have a normal appearance. There is layering high attenuation the within the gallbladder consistent with stones or sludge.  Bowel: The stomach has a normal appearance. Terminal ileum is notable for wall thickening and mild irregularity. This involves the distal 20 cm of the ileum. No associated and abscess or perforation. There are numerous colonic diverticula. No acute diverticulitis. The appendix is well seen and has a normal appearance.  Pelvis: The uterus is present. There is free pelvic fluid. No adnexal mass.  Retroperitoneum: No retroperitoneal or mesenteric adenopathy. No evidence for aortic aneurysm. There is atherosclerotic calcification of the abdominal aorta.  Abdominal wall: Unremarkable.  Osseous structures: Previous fracture and  vertebroplasty at L1.  IMPRESSION: 1. Ascites. 2. Thickening and irregularity of the distal ileal loops, raising the question of Crohn's disease. 3. No evidence for abscess or free air. 4. Diverticulosis. 5. Layering sludge sludge or stones within the gallbladder. 6. Old fracture and vertebroplasty at L1.   Electronically Signed   By: Rosalie Gums M.D.   On: 02/07/2014 15:39    Assessment/Plan Principal Problem:   Diarrhea: has h/o c diff and recent abx. Will check stool for c diff PCR and gi pathogen panel. Empiric flagyl and clears for now. Due to hypotension (though better after 2 liters saline), acute renal failure, concerned about early sepsis. Monitor in SDU Active Problems:   Acute renal failure: prerenal v. Sepsis/hemodynamic related. Stop metformin, hctz. Hydrate.   Hypotension better after saline, but monitor in SDU   Benign hypertension   Obesity   GERD (gastroesophageal reflux disease): hold PPI in case c diff    Hyperlipidemia: hold statin   Code Status: full Disposition Plan: SDU  Time spent: 60 min  Britten Seyfried L [facility] Pager [phone]

## 2014-02-07 NOTE — ED Provider Notes (Signed)
Medical screening examination/treatment/procedure(s) were performed by non-physician practitioner and as supervising physician I was immediately available for consultation/collaboration.     Aldora Perman, MD 02/07/14 1648 

## 2014-02-08 ENCOUNTER — Inpatient Hospital Stay (HOSPITAL_COMMUNITY): Payer: BC Managed Care – PPO

## 2014-02-08 DIAGNOSIS — E1129 Type 2 diabetes mellitus with other diabetic kidney complication: Secondary | ICD-10-CM | POA: Diagnosis present

## 2014-02-08 LAB — BASIC METABOLIC PANEL
Anion gap: 16 — ABNORMAL HIGH (ref 5–15)
BUN: 26 mg/dL — ABNORMAL HIGH (ref 6–23)
CO2: 18 meq/L — AB (ref 19–32)
Calcium: 6.9 mg/dL — ABNORMAL LOW (ref 8.4–10.5)
Chloride: 100 mEq/L (ref 96–112)
Creatinine, Ser: 1.83 mg/dL — ABNORMAL HIGH (ref 0.50–1.10)
GFR calc Af Amer: 33 mL/min — ABNORMAL LOW (ref >90)
GFR calc non Af Amer: 28 mL/min — ABNORMAL LOW (ref >90)
Glucose, Bld: 103 mg/dL — ABNORMAL HIGH (ref 70–99)
POTASSIUM: 3.7 meq/L (ref 3.7–5.3)
SODIUM: 134 meq/L — AB (ref 137–147)

## 2014-02-08 LAB — GLUCOSE, CAPILLARY
GLUCOSE-CAPILLARY: 114 mg/dL — AB (ref 70–99)
Glucose-Capillary: 117 mg/dL — ABNORMAL HIGH (ref 70–99)
Glucose-Capillary: 159 mg/dL — ABNORMAL HIGH (ref 70–99)

## 2014-02-08 LAB — CBC WITH DIFFERENTIAL/PLATELET
Basophils Absolute: 0 10*3/uL (ref 0.0–0.1)
Basophils Relative: 0 % (ref 0–1)
EOS PCT: 6 % — AB (ref 0–5)
Eosinophils Absolute: 0.4 10*3/uL (ref 0.0–0.7)
HEMATOCRIT: 37 % (ref 36.0–46.0)
Hemoglobin: 12.3 g/dL (ref 12.0–15.0)
LYMPHS ABS: 2.1 10*3/uL (ref 0.7–4.0)
LYMPHS PCT: 27 % (ref 12–46)
MCH: 33 pg (ref 26.0–34.0)
MCHC: 33.2 g/dL (ref 30.0–36.0)
MCV: 99.2 fL (ref 78.0–100.0)
MONO ABS: 0.8 10*3/uL (ref 0.1–1.0)
Monocytes Relative: 10 % (ref 3–12)
NEUTROS ABS: 4.5 10*3/uL (ref 1.7–7.7)
Neutrophils Relative %: 58 % (ref 43–77)
Platelets: 180 10*3/uL (ref 150–400)
RBC: 3.73 MIL/uL — AB (ref 3.87–5.11)
RDW: 12.2 % (ref 11.5–15.5)
WBC: 7.8 10*3/uL (ref 4.0–10.5)

## 2014-02-08 LAB — CLOSTRIDIUM DIFFICILE BY PCR: Toxigenic C. Difficile by PCR: POSITIVE — AB

## 2014-02-08 NOTE — Progress Notes (Signed)
Utilization review completed.  

## 2014-02-08 NOTE — Progress Notes (Signed)
Full report given to [facility] of [facility]. Pt [facility]SS for transport.

## 2014-02-08 NOTE — Progress Notes (Signed)
Received patient from 3S.  Patient alert and oriented x4; complains of pain in abdomen.  Vitals obtained.  Patient oriented to room and unit.  Will continue to monitor.

## 2014-02-08 NOTE — Progress Notes (Signed)
[facility] PROGRESS NOTE  Ariel Meyer WUJ:[id number] DOB: 09-13-1950 DOA: 02/07/2014 PCP: Warrick Parisian, MD  Assessment/Plan:  Principal Problem:   Diarrhea, abdominal pain: no further stool, reports no flatus, either, high pitched BS. Will check abd films r/o ileus/obstruction.  Active Problems:   Acute renal failure resolving   Hypotension resolved   Benign hypertension   Obesity   GERD (gastroesophageal reflux disease)   Hyperlipidemia  Code Status:  full Family Communication:   Disposition Plan:  Continue SDU.  Consultants:    Procedures:     Antibiotics:  flagyl  HPI/Subjective: No N/V/D. No flatus. Abdominal pain remains, but hungry and requesting solids. No f/c. Last colonoscopy 2008 in [place]. Cannot remember which MD.  Objective: Filed Vitals:   02/08/14 0900  BP:   Pulse: 85  Temp:   Resp: 16    Intake/Output Summary (Last 24 hours) at 02/08/14 0923 Last data filed at 02/08/14 0700  Gross per 24 hour  Intake   1755 ml  Output    975 ml  Net    780 ml   Filed Weights   02/07/14 1730 02/08/14 0303  Weight: 107.4 kg (236 lb 12.4 oz) 106.822 kg (235 lb 8 oz)    Exam:   General:  Asleep. Arousable. comfortable  Cardiovascular: RRR without MGR  Respiratory: CTA without WRR  Abdomen: high pitched BS. S, nondistended. Mild tenderness  Ext: no CCE  Basic Metabolic Panel:  Recent Labs Lab 02/07/14 1005 02/08/14 0316  NA 136* 134*  K 3.8 3.7  CL 96 100  CO2 21 18*  GLUCOSE 139* 103*  BUN 30* 26*  CREATININE 3.11* 1.83*  CALCIUM 8.4 6.9*   Liver Function Tests:  Recent Labs Lab 02/07/14 1005  AST 43*  ALT 35  ALKPHOS 91  BILITOT 0.8  PROT 6.9  ALBUMIN 3.3*    Recent Labs Lab 02/07/14 1005  LIPASE 18   No results found for this basename: AMMONIA,  in the last 168 hours CBC:  Recent Labs Lab 02/07/14 1005 02/08/14 0316  WBC 12.1* 7.8  NEUTROABS 8.5* 4.5  HGB 15.6* 12.3  HCT 45.3 37.0  MCV 97.2  99.2  PLT 221 180   Cardiac Enzymes: No results found for this basename: CKTOTAL, CKMB, CKMBINDEX, TROPONINI,  in the last 168 hours BNP (last 3 results) No results found for this basename: PROBNP,  in the last 8760 hours CBG:  Recent Labs Lab 02/07/14 2157 02/08/14 0738  GLUCAP 122* 117*    Recent Results (from the past 240 hour(s))  MRSA PCR SCREENING     Status: None   Collection Time    02/07/14  6:00 PM      Result Value Ref Range Status   MRSA by PCR NEGATIVE  NEGATIVE Final   Comment:            The GeneXpert MRSA Assay (FDA     approved for NASAL specimens     only), is one component of a     comprehensive MRSA colonization     surveillance program. It is not     intended to diagnose MRSA     infection nor to guide or     monitor treatment for     MRSA infections.     Studies: Ct Abdomen Pelvis Wo Contrast  02/07/2014   CLINICAL DATA:  Abdominal pain.  Bloating, diarrhea.  EXAM: CT ABDOMEN AND PELVIS WITHOUT CONTRAST  TECHNIQUE: Multidetector CT imaging of the abdomen and pelvis was performed  following the standard protocol without IV contrast.  COMPARISON:  10/24/2010,1 05/28/2012  FINDINGS: Lower chest: The lung bases are unremarkable in appearance. Heart size is normal. No pericardial effusion. No pleural effusions.  Upper abdomen: There is ascites around the liver and spleen. No focal abnormality identified within the liver, spleen, pancreas, or adrenal glands. The kidneys have a normal appearance. There is layering high attenuation the within the gallbladder consistent with stones or sludge.  Bowel: The stomach has a normal appearance. Terminal ileum is notable for wall thickening and mild irregularity. This involves the distal 20 cm of the ileum. No associated and abscess or perforation. There are numerous colonic diverticula. No acute diverticulitis. The appendix is well seen and has a normal appearance.  Pelvis: The uterus is present. There is free pelvic fluid. No  adnexal mass.  Retroperitoneum: No retroperitoneal or mesenteric adenopathy. No evidence for aortic aneurysm. There is atherosclerotic calcification of the abdominal aorta.  Abdominal wall: Unremarkable.  Osseous structures: Previous fracture and vertebroplasty at L1.  IMPRESSION: 1. Ascites. 2. Thickening and irregularity of the distal ileal loops, raising the question of Crohn's disease. 3. No evidence for abscess or free air. 4. Diverticulosis. 5. Layering sludge sludge or stones within the gallbladder. 6. Old fracture and vertebroplasty at L1.   Electronically Signed   By: Rosalie Gums M.D.   On: 02/07/2014 15:39    Scheduled Meds: . FLUoxetine  40 mg Oral Daily  . heparin  5,000 Units Subcutaneous 3 times per day  . insulin aspart  0-9 Units Subcutaneous TID WC  . metroNIDAZOLE  500 mg Oral Q8H  . sodium chloride  3 mL Intravenous Q12H   Continuous Infusions: . sodium chloride 125 mL/hr at 02/07/14 1900  . sodium chloride 125 mL/hr at 02/08/14 0115    Time spent: 35 minutes  Ariel Meyer L  [facility] Pager [phone]. If 7PM-7AM, please contact night-coverage at [email or web address], password [facility] 02/08/2014, 9:23 AM  LOS: 1 day

## 2014-02-08 NOTE — Progress Notes (Signed)
CRITICAL VALUE ALERT  Critical value received:  C-difficile PCR positive   Date of notification:  02/08/2014  Time of notification:  1625  Critical value read back:Yes.    Nurse who received alert:  Antony Contras, RN  MD notified (1st page):  Text paged Dr. Lendell Caprice  Time of first page:  1644  MD notified (2nd page):  Time of second page:  Responding MD:    Time MD responded:

## 2014-02-09 DIAGNOSIS — A0472 Enterocolitis due to Clostridium difficile, not specified as recurrent: Secondary | ICD-10-CM

## 2014-02-09 LAB — GI PATHOGEN PANEL BY PCR, STOOL
C difficile toxin A/B: NEGATIVE
Campylobacter by PCR: NEGATIVE
Cryptosporidium by PCR: NEGATIVE
E COLI (ETEC) LT/ST: NEGATIVE
E COLI 0157 BY PCR: NEGATIVE
E coli (STEC): NEGATIVE
G lamblia by PCR: NEGATIVE
Norovirus GI/GII: NEGATIVE
Rotavirus A by PCR: NEGATIVE
SALMONELLA BY PCR: NEGATIVE
SHIGELLA BY PCR: NEGATIVE

## 2014-02-09 LAB — GLUCOSE, CAPILLARY
GLUCOSE-CAPILLARY: 105 mg/dL — AB (ref 70–99)
GLUCOSE-CAPILLARY: 110 mg/dL — AB (ref 70–99)
Glucose-Capillary: 100 mg/dL — ABNORMAL HIGH (ref 70–99)
Glucose-Capillary: 80 mg/dL (ref 70–99)

## 2014-02-09 LAB — BASIC METABOLIC PANEL
ANION GAP: 14 (ref 5–15)
BUN: 14 mg/dL (ref 6–23)
CHLORIDE: 101 meq/L (ref 96–112)
CO2: 21 mEq/L (ref 19–32)
Calcium: 7 mg/dL — ABNORMAL LOW (ref 8.4–10.5)
Creatinine, Ser: 1.08 mg/dL (ref 0.50–1.10)
GFR, EST AFRICAN AMERICAN: 62 mL/min — AB (ref >90)
GFR, EST NON AFRICAN AMERICAN: 53 mL/min — AB (ref >90)
Glucose, Bld: 111 mg/dL — ABNORMAL HIGH (ref 70–99)
POTASSIUM: 3.9 meq/L (ref 3.7–5.3)
SODIUM: 136 meq/L — AB (ref 137–147)

## 2014-02-09 MED ORDER — OXYCODONE HCL 5 MG PO TABS
5.0000 mg | ORAL_TABLET | ORAL | Status: DC | PRN
Start: 2014-02-09 — End: 2014-02-10
  Administered 2014-02-09 – 2014-02-10 (×4): 10 mg via ORAL
  Filled 2014-02-09 (×4): qty 2

## 2014-02-09 NOTE — Progress Notes (Signed)
[facility] PROGRESS NOTE  Paulo FruitMalona Hayter WUJ:[id number] DOB: 08/11/1950 DOA: 02/07/2014 PCP: Warrick ParisianSTALLINGS,SHEILA, MD  Assessment/Plan:  Principal Problem:   C diff colitis. Abdominal films ok. Tolerating solids. Stools forming up. Continue flagyl. Change pain meds to po. Saline lock. Increase activity. D/c in am if stable Active Problems:   Acute renal failure resolved secondary to above   Hypotension resolved   Benign hypertension: meds held   Obesity   GERD (gastroesophageal reflux disease)   Hyperlipidemia  Consultants:    Procedures:     Antibiotics:  flagyl  HPI/Subjective: Still with pain, but improving. Stools "pastey". tol solids. Does not feel well enough to go home  Objective: Filed Vitals:   02/09/14 1014  BP: 98/54  Pulse: 85  Temp: 97.8 F (36.6 C)  Resp: 18    Intake/Output Summary (Last 24 hours) at 02/09/14 1227 Last data filed at 02/09/14 0600  Gross per 24 hour  Intake   2315 ml  Output    500 ml  Net   1815 ml   Filed Weights   02/07/14 1730 02/08/14 0303 02/08/14 2138  Weight: 107.4 kg (236 lb 12.4 oz) 106.822 kg (235 lb 8 oz) 106.822 kg (235 lb 8 oz)    Exam:   General:  In bed. comfortable  Cardiovascular: RRR without MGR  Respiratory: CTA without WRR  Abdomen: S, NT, ND  Ext: no CCE  Basic Metabolic Panel:  Recent Labs Lab 02/07/14 1005 02/08/14 0316 02/09/14 0605  NA 136* 134* 136*  K 3.8 3.7 3.9  CL 96 100 101  CO2 21 18* 21  GLUCOSE 139* 103* 111*  BUN 30* 26* 14  CREATININE 3.11* 1.83* 1.08  CALCIUM 8.4 6.9* 7.0*   Liver Function Tests:  Recent Labs Lab 02/07/14 1005  AST 43*  ALT 35  ALKPHOS 91  BILITOT 0.8  PROT 6.9  ALBUMIN 3.3*    Recent Labs Lab 02/07/14 1005  LIPASE 18   No results found for this basename: AMMONIA,  in the last 168 hours CBC:  Recent Labs Lab 02/07/14 1005 02/08/14 0316  WBC 12.1* 7.8  NEUTROABS 8.5* 4.5  HGB 15.6* 12.3  HCT 45.3 37.0  MCV 97.2 99.2   PLT 221 180   Cardiac Enzymes: No results found for this basename: CKTOTAL, CKMB, CKMBINDEX, TROPONINI,  in the last 168 hours BNP (last 3 results) No results found for this basename: PROBNP,  in the last 8760 hours CBG:  Recent Labs Lab 02/08/14 0738 02/08/14 1737 02/08/14 2142 02/09/14 0807 02/09/14 1200  GLUCAP 117* 114* 159* 105* 100*    Recent Results (from the past 240 hour(s))  MRSA PCR SCREENING     Status: None   Collection Time    02/07/14  6:00 PM      Result Value Ref Range Status   MRSA by PCR NEGATIVE  NEGATIVE Final   Comment:            The GeneXpert MRSA Assay (FDA     approved for NASAL specimens     only), is one component of a     comprehensive MRSA colonization     surveillance program. It is not     intended to diagnose MRSA     infection nor to guide or     monitor treatment for     MRSA infections.  CLOSTRIDIUM DIFFICILE BY PCR     Status: Abnormal   Collection Time    02/08/14  2:11 PM  Result Value Ref Range Status   C difficile by pcr POSITIVE (*) NEGATIVE Final   Comment: CRITICAL RESULT CALLED TO, READ BACK BY AND VERIFIED WITH:     K. COOK RN 16:25 02/08/14 ([email or web address])     Studies: Ct Abdomen Pelvis Wo Contrast  02/07/2014   CLINICAL DATA:  Abdominal pain.  Bloating, diarrhea.  EXAM: CT ABDOMEN AND PELVIS WITHOUT CONTRAST  TECHNIQUE: Multidetector CT imaging of the abdomen and pelvis was performed following the standard protocol without IV contrast.  COMPARISON:  10/24/2010,1 05/28/2012  FINDINGS: Lower chest: The lung bases are unremarkable in appearance. Heart size is normal. No pericardial effusion. No pleural effusions.  Upper abdomen: There is ascites around the liver and spleen. No focal abnormality identified within the liver, spleen, pancreas, or adrenal glands. The kidneys have a normal appearance. There is layering high attenuation the within the gallbladder consistent with stones or sludge.  Bowel: The stomach has a normal  appearance. Terminal ileum is notable for wall thickening and mild irregularity. This involves the distal 20 cm of the ileum. No associated and abscess or perforation. There are numerous colonic diverticula. No acute diverticulitis. The appendix is well seen and has a normal appearance.  Pelvis: The uterus is present. There is free pelvic fluid. No adnexal mass.  Retroperitoneum: No retroperitoneal or mesenteric adenopathy. No evidence for aortic aneurysm. There is atherosclerotic calcification of the abdominal aorta.  Abdominal wall: Unremarkable.  Osseous structures: Previous fracture and vertebroplasty at L1.  IMPRESSION: 1. Ascites. 2. Thickening and irregularity of the distal ileal loops, raising the question of Crohn's disease. 3. No evidence for abscess or free air. 4. Diverticulosis. 5. Layering sludge sludge or stones within the gallbladder. 6. Old fracture and vertebroplasty at L1.   Electronically Signed   By: Rosalie Gums M.D.   On: 02/07/2014 15:39   Dg Abd Acute W/chest  02/08/2014   CLINICAL DATA:  Abdominal pain.  Rule out obstruction.  EXAM: ACUTE ABDOMEN SERIES (ABDOMEN 2 VIEW & CHEST 1 VIEW)  COMPARISON:  Abdominal CT 02/07/2014 and chest radiograph 06/08/2013  FINDINGS: Single view of the chest was obtained. Prominent right paratracheal soft tissue is probably related to technique. Heart size is stable. Coarse lung markings appear chronic. Evidence for scarring or pleural thickening along the left upper lung. No evidence of free air on the upright view. There is gas and contrast throughout the colon. No evidence for small bowel distension. There is gas in the rectum. Old compression fracture with bone cement at L1.  IMPRESSION: Nonobstructive bowel gas pattern. There is gas and contrast throughout the colon.  Chronic changes in the lungs.   Electronically Signed   By: Richarda Overlie M.D.   On: 02/08/2014 10:54    Scheduled Meds: . FLUoxetine  40 mg Oral Daily  . heparin  5,000 Units  Subcutaneous 3 times per day  . insulin aspart  0-9 Units Subcutaneous TID WC  . metroNIDAZOLE  500 mg Oral Q8H  . sodium chloride  3 mL Intravenous Q12H   Continuous Infusions: . sodium chloride 10 mL/hr (02/09/14 0829)    Time spent: 25 minutes  Taelynn Mcelhannon L  [facility] Pager [phone]. If 7PM-7AM, please contact night-coverage at [email or web address], password [facility] 02/09/2014, 12:27 PM  LOS: 2 days

## 2014-02-10 DIAGNOSIS — J018 Other acute sinusitis: Secondary | ICD-10-CM

## 2014-02-10 DIAGNOSIS — J019 Acute sinusitis, unspecified: Secondary | ICD-10-CM | POA: Clinically undetermined

## 2014-02-10 DIAGNOSIS — R112 Nausea with vomiting, unspecified: Secondary | ICD-10-CM | POA: Diagnosis present

## 2014-02-10 LAB — GLUCOSE, CAPILLARY
GLUCOSE-CAPILLARY: 128 mg/dL — AB (ref 70–99)
GLUCOSE-CAPILLARY: 101 mg/dL — AB (ref 70–99)
GLUCOSE-CAPILLARY: 97 mg/dL (ref 70–99)
Glucose-Capillary: 116 mg/dL — ABNORMAL HIGH (ref 70–99)

## 2014-02-10 MED ORDER — GUAIFENESIN ER 600 MG PO TB12
600.0000 mg | ORAL_TABLET | Freq: Two times a day (BID) | ORAL | Status: DC
  Administered 2014-02-10 – 2014-02-12 (×5): 600 mg via ORAL
  Filled 2014-02-10 (×6): qty 1

## 2014-02-10 MED ORDER — LISINOPRIL 10 MG PO TABS
10.0000 mg | ORAL_TABLET | Freq: Every day | ORAL | Status: DC
  Administered 2014-02-11 – 2014-02-12 (×2): 10 mg via ORAL
  Filled 2014-02-10 (×3): qty 1

## 2014-02-10 MED ORDER — SUCRALFATE 1 GM/10ML PO SUSP
1.0000 g | Freq: Three times a day (TID) | ORAL | Status: DC
  Administered 2014-02-10 – 2014-02-12 (×8): 1 g via ORAL
  Filled 2014-02-10 (×12): qty 10

## 2014-02-10 MED ORDER — FLUTICASONE PROPIONATE 50 MCG/ACT NA SUSP
2.0000 | Freq: Every day | NASAL | Status: DC
  Administered 2014-02-10 – 2014-02-12 (×3): 2 via NASAL
  Filled 2014-02-10: qty 16

## 2014-02-10 NOTE — Progress Notes (Addendum)
[facility] PROGRESS NOTE  Ariel Meyer ZOX:[id number] DOB: 08-Dec-1950 DOA: 02/07/2014 PCP: Warrick Parisian, MD Summary 64 y.o female with previous h/o c diff, htn, gerd presented with abdominal pain, several weeks of diarrhea after having taken abx for mastitis, hypotension and acute renal failure. c diff positive.  Assessment/Plan:  Principal Problem:   C diff colitis. Now with n/v. Due to oral pain meds? D/c oxycodone. Not ready for discharge Active Problems: N/v: see above   Acute renal failure resolved secondary to above, resolved   Hypotension resolved   Benign hypertension: blood pressure creeping up. Resume lisinopril   Obesity   GERD (gastroesophageal reflux disease): ppi held due to C diff. Having symptoms. Add carafate   Hyperlipidemia Acute sinusitis: mucinex and flonase  Consultants:    Procedures:     Antibiotics:  flagyl  HPI/Subjective: N/v several times since last night. Had loose stool. Also c/o frontal sinus pressure, requesting mucinex and flonase.  Objective: Filed Vitals:   02/10/14 0959  BP: 144/87  Pulse: 80  Temp: 97.4 F (36.3 C)  Resp: 17    Intake/Output Summary (Last 24 hours) at 02/10/14 1522 Last data filed at 02/10/14 1429  Gross per 24 hour  Intake    240 ml  Output      3 ml  Net    237 ml   Filed Weights   02/08/14 0303 02/08/14 2138 02/09/14 2137  Weight: 106.822 kg (235 lb 8 oz) 106.822 kg (235 lb 8 oz) 111.131 kg (245 lb)    Exam:   General:  In bed. comfortable  Cardiovascular: RRR without MGR  Respiratory: CTA without WRR  Abdomen: S, NT, ND  Ext: no CCE  Basic Metabolic Panel:  Recent Labs Lab 02/07/14 1005 02/08/14 0316 02/09/14 0605  NA 136* 134* 136*  K 3.8 3.7 3.9  CL 96 100 101  CO2 21 18* 21  GLUCOSE 139* 103* 111*  BUN 30* 26* 14  CREATININE 3.11* 1.83* 1.08  CALCIUM 8.4 6.9* 7.0*   Liver Function Tests:  Recent Labs Lab 02/07/14 1005  AST 43*  ALT 35  ALKPHOS 91   BILITOT 0.8  PROT 6.9  ALBUMIN 3.3*    Recent Labs Lab 02/07/14 1005  LIPASE 18   No results found for this basename: AMMONIA,  in the last 168 hours CBC:  Recent Labs Lab 02/07/14 1005 02/08/14 0316  WBC 12.1* 7.8  NEUTROABS 8.5* 4.5  HGB 15.6* 12.3  HCT 45.3 37.0  MCV 97.2 99.2  PLT 221 180   Cardiac Enzymes: No results found for this basename: CKTOTAL, CKMB, CKMBINDEX, TROPONINI,  in the last 168 hours BNP (last 3 results) No results found for this basename: PROBNP,  in the last 8760 hours CBG:  Recent Labs Lab 02/09/14 1200 02/09/14 1734 02/09/14 2136 02/10/14 0752 02/10/14 1133  GLUCAP 100* 80 110* 116* 128*    Recent Results (from the past 240 hour(s))  MRSA PCR SCREENING     Status: None   Collection Time    02/07/14  6:00 PM      Result Value Ref Range Status   MRSA by PCR NEGATIVE  NEGATIVE Final   Comment:            The GeneXpert MRSA Assay (FDA     approved for NASAL specimens     only), is one component of a     comprehensive MRSA colonization     surveillance program. It is not     intended to  diagnose MRSA     infection nor to guide or     monitor treatment for     MRSA infections.  CLOSTRIDIUM DIFFICILE BY PCR     Status: Abnormal   Collection Time    02/08/14  2:11 PM      Result Value Ref Range Status   C difficile by pcr POSITIVE (*) NEGATIVE Final   Comment: CRITICAL RESULT CALLED TO, READ BACK BY AND VERIFIED WITH:     K. COOK RN 16:25 02/08/14 (wilsonm)     Studies: No results found.  Scheduled Meds: . FLUoxetine  40 mg Oral Daily  . fluticasone  2 spray Each Nare Daily  . guaiFENesin  600 mg Oral BID  . heparin  5,000 Units Subcutaneous 3 times per day  . insulin aspart  0-9 Units Subcutaneous TID WC  . lisinopril  10 mg Oral Daily  . metroNIDAZOLE  500 mg Oral Q8H  . sodium chloride  3 mL Intravenous Q12H  . sucralfate  1 g Oral TID WC & HS   Continuous Infusions:    Time spent: 25 minutes  Ariel Meyer  L  [facility] Pager [phone]. If 7PM-7AM, please contact night-coverage at [email or web address], password [email or web address] 02/10/2014, 3:22 PM  LOS: 3 days

## 2014-02-11 DIAGNOSIS — R112 Nausea with vomiting, unspecified: Secondary | ICD-10-CM

## 2014-02-11 DIAGNOSIS — I10 Essential (primary) hypertension: Secondary | ICD-10-CM

## 2014-02-11 DIAGNOSIS — A047 Enterocolitis due to Clostridium difficile: Principal | ICD-10-CM

## 2014-02-11 LAB — BASIC METABOLIC PANEL
ANION GAP: 12 (ref 5–15)
BUN: 7 mg/dL (ref 6–23)
CO2: 24 mEq/L (ref 19–32)
CREATININE: 0.95 mg/dL (ref 0.50–1.10)
Calcium: 8.2 mg/dL — ABNORMAL LOW (ref 8.4–10.5)
Chloride: 103 mEq/L (ref 96–112)
GFR calc Af Amer: 72 mL/min — ABNORMAL LOW (ref >90)
GFR, EST NON AFRICAN AMERICAN: 62 mL/min — AB (ref >90)
Glucose, Bld: 103 mg/dL — ABNORMAL HIGH (ref 70–99)
POTASSIUM: 4.2 meq/L (ref 3.7–5.3)
Sodium: 139 mEq/L (ref 137–147)

## 2014-02-11 LAB — CBC
HCT: 37.3 % (ref 36.0–46.0)
HEMOGLOBIN: 12.5 g/dL (ref 12.0–15.0)
MCH: 33.2 pg (ref 26.0–34.0)
MCHC: 33.5 g/dL (ref 30.0–36.0)
MCV: 98.9 fL (ref 78.0–100.0)
PLATELETS: 185 10*3/uL (ref 150–400)
RBC: 3.77 MIL/uL — AB (ref 3.87–5.11)
RDW: 11.9 % (ref 11.5–15.5)
WBC: 6.2 10*3/uL (ref 4.0–10.5)

## 2014-02-11 LAB — GLUCOSE, CAPILLARY
GLUCOSE-CAPILLARY: 83 mg/dL (ref 70–99)
GLUCOSE-CAPILLARY: 101 mg/dL — AB (ref 70–99)
Glucose-Capillary: 95 mg/dL (ref 70–99)
Glucose-Capillary: 124 mg/dL — ABNORMAL HIGH (ref 70–99)

## 2014-02-11 LAB — TROPONIN I: Troponin I: <0.3 ng/mL (ref <0.30)

## 2014-02-11 LAB — LIPASE, BLOOD: Lipase: 11 U/L (ref 11–59)

## 2014-02-11 MED ORDER — LIDOCAINE 5 % EX PTCH
1.0000 | MEDICATED_PATCH | CUTANEOUS | Status: DC
  Administered 2014-02-11 – 2014-02-12 (×2): 1 via TRANSDERMAL
  Filled 2014-02-11 (×3): qty 1

## 2014-02-11 NOTE — Progress Notes (Signed)
[facility] PROGRESS NOTE  Ariel Meyer ZOX:[id number] DOB: 24-Dec-1950 DOA: 02/07/2014 PCP: Warrick Parisian, MD Summary 63 y.o female with previous h/o c diff, htn, gerd presented with abdominal pain, several weeks of diarrhea after having taken abx for mastitis, hypotension and acute renal failure. c diff positive.  Assessment/Plan:    C diff colitis.flagyl  N/v: zofran -await labs  Pain between shoulder blades -EKG -check troponin -lipase    Acute renal failure resolved secondary to above, resolved   Hypotension resolved   Benign hypertension: blood pressure creeping up. Resume lisinopril   Obesity   GERD (gastroesophageal reflux disease): ppi held due to C diff. Having symptoms. Add carafate   Hyperlipidemia Acute sinusitis: mucinex and flonase  Consultants:    Procedures:     Antibiotics:  flagyl  HPI/Subjective: Still with soft stools N/V C/o pain between shoulder blades  Objective: Filed Vitals:   02/11/14 0626  BP: 139/89  Pulse: 80  Temp: 98.3 F (36.8 C)  Resp: 18    Intake/Output Summary (Last 24 hours) at 02/11/14 0826 Last data filed at 02/11/14 0600  Gross per 24 hour  Intake    240 ml  Output   1603 ml  Net  -1363 ml   Filed Weights   02/08/14 2138 02/09/14 2137 02/10/14 2212  Weight: 106.822 kg (235 lb 8 oz) 111.131 kg (245 lb) 110.6 kg (243 lb 13.3 oz)    Exam:   General:  In bed. comfortable  Cardiovascular: RRR without MGR  Respiratory: CTA without WRR  Abdomen: S, NT, ND  Ext: no CCE  Basic Metabolic Panel:  Recent Labs Lab 02/07/14 1005 02/08/14 0316 02/09/14 0605 02/11/14 0645  NA 136* 134* 136* 139  K 3.8 3.7 3.9 4.2  CL 96 100 101 103  CO2 21 18* 21 24  GLUCOSE 139* 103* 111* 103*  BUN 30* 26* 14 7  CREATININE 3.11* 1.83* 1.08 0.95  CALCIUM 8.4 6.9* 7.0* 8.2*   Liver Function Tests:  Recent Labs Lab 02/07/14 1005  AST 43*  ALT 35  ALKPHOS 91  BILITOT 0.8  PROT 6.9  ALBUMIN 3.3*     Recent Labs Lab 02/07/14 1005  LIPASE 18   No results found for this basename: AMMONIA,  in the last 168 hours CBC:  Recent Labs Lab 02/07/14 1005 02/08/14 0316 02/11/14 0645  WBC 12.1* 7.8 6.2  NEUTROABS 8.5* 4.5  --   HGB 15.6* 12.3 12.5  HCT 45.3 37.0 37.3  MCV 97.2 99.2 98.9  PLT 221 180 185   Cardiac Enzymes: No results found for this basename: CKTOTAL, CKMB, CKMBINDEX, TROPONINI,  in the last 168 hours BNP (last 3 results) No results found for this basename: PROBNP,  in the last 8760 hours CBG:  Recent Labs Lab 02/09/14 2136 02/10/14 0752 02/10/14 1133 02/10/14 1626 02/10/14 2210  GLUCAP 110* 116* 128* 101* 97    Recent Results (from the past 240 hour(s))  MRSA PCR SCREENING     Status: None   Collection Time    02/07/14  6:00 PM      Result Value Ref Range Status   MRSA by PCR NEGATIVE  NEGATIVE Final   Comment:            The GeneXpert MRSA Assay (FDA     approved for NASAL specimens     only), is one component of a     comprehensive MRSA colonization     surveillance program. It is not     intended  to diagnose MRSA     infection nor to guide or     monitor treatment for     MRSA infections.  CLOSTRIDIUM DIFFICILE BY PCR     Status: Abnormal   Collection Time    02/08/14  2:11 PM      Result Value Ref Range Status   C difficile by pcr POSITIVE (*) NEGATIVE Final   Comment: CRITICAL RESULT CALLED TO, READ BACK BY AND VERIFIED WITH:     K. COOK RN 16:25 02/08/14 (wilsonm)     Studies: No results found.  Scheduled Meds: . FLUoxetine  40 mg Oral Daily  . fluticasone  2 spray Each Nare Daily  . guaiFENesin  600 mg Oral BID  . heparin  5,000 Units Subcutaneous 3 times per day  . insulin aspart  0-9 Units Subcutaneous TID WC  . lidocaine  1 patch Transdermal Q24H  . lisinopril  10 mg Oral Daily  . metroNIDAZOLE  500 mg Oral Q8H  . sodium chloride  3 mL Intravenous Q12H  . sucralfate  1 g Oral TID WC & HS   Continuous Infusions:     Time spent: 25 minutes  Benjamine MolaVANN, Shelise Maron  [facility] Pager [phone] If 7PM-7AM, please contact night-coverage at [email or web address], password [email or web address] 02/11/2014, 8:26 AM  LOS: 4 days

## 2014-02-11 NOTE — Progress Notes (Signed)
SATURATION QUALIFICATIONS:   Patient Saturations on Room Air at Rest = 96% Patient Saturations on [employer] Air while Ambulating = 95% Patient Saturations on 2 Liters of oxygen while Ambulating = did not need to apply O2    Please briefly explain why patient needs home oxygen: Pt does not seem to need home oxygen at this time d/t previously mentioned saturation values.

## 2014-02-12 LAB — COMPREHENSIVE METABOLIC PANEL
ALT: 42 U/L — ABNORMAL HIGH (ref 0–35)
AST: 54 U/L — ABNORMAL HIGH (ref 0–37)
Albumin: 2.8 g/dL — ABNORMAL LOW (ref 3.5–5.2)
Alkaline Phosphatase: 104 U/L (ref 39–117)
Anion gap: 14 (ref 5–15)
BUN: 8 mg/dL (ref 6–23)
CALCIUM: 8.4 mg/dL (ref 8.4–10.5)
CO2: 23 mEq/L (ref 19–32)
Chloride: 102 mEq/L (ref 96–112)
Creatinine, Ser: 0.91 mg/dL (ref 0.50–1.10)
GFR calc non Af Amer: 65 mL/min — ABNORMAL LOW (ref >90)
GFR, EST AFRICAN AMERICAN: 76 mL/min — AB (ref >90)
GLUCOSE: 106 mg/dL — AB (ref 70–99)
Potassium: 4.4 mEq/L (ref 3.7–5.3)
Sodium: 139 mEq/L (ref 137–147)
TOTAL PROTEIN: 6.7 g/dL (ref 6.0–8.3)
Total Bilirubin: 0.5 mg/dL (ref 0.3–1.2)

## 2014-02-12 LAB — CBC
HCT: 37 % (ref 36.0–46.0)
Hemoglobin: 12.4 g/dL (ref 12.0–15.0)
MCH: 33.7 pg (ref 26.0–34.0)
MCHC: 33.5 g/dL (ref 30.0–36.0)
MCV: 100.5 fL — ABNORMAL HIGH (ref 78.0–100.0)
Platelets: 208 10*3/uL (ref 150–400)
RBC: 3.68 MIL/uL — ABNORMAL LOW (ref 3.87–5.11)
RDW: 12 % (ref 11.5–15.5)
WBC: 8 10*3/uL (ref 4.0–10.5)

## 2014-02-12 LAB — GLUCOSE, CAPILLARY
Glucose-Capillary: 118 mg/dL — ABNORMAL HIGH (ref 70–99)
Glucose-Capillary: 117 mg/dL — ABNORMAL HIGH (ref 70–99)

## 2014-02-12 MED ORDER — FLUTICASONE PROPIONATE 50 MCG/ACT NA SUSP: 2.0000 | Freq: Every day | NASAL | Status: AC

## 2014-02-12 MED ORDER — METRONIDAZOLE 500 MG PO TABS: 500.0000 mg | ORAL_TABLET | Freq: Three times a day (TID) | ORAL | Status: DC

## 2014-02-12 MED ORDER — METHOCARBAMOL 500 MG PO TABS
500.0000 mg | ORAL_TABLET | Freq: Three times a day (TID) | ORAL | Status: DC | PRN
  Administered 2014-02-12: 500 mg via ORAL
  Filled 2014-02-12: qty 1

## 2014-02-12 MED ORDER — MENTHOL 3 MG MT LOZG: 1.0000 | LOZENGE | OROMUCOSAL | Status: DC | PRN

## 2014-02-12 NOTE — Progress Notes (Signed)
Patient Discharge:  Disposition: Patient discharged home with husband    Education: Patient educated on diagnosis, new medications, and all discharge instructions. Patient verbalized understanding.  IV: Right hand IV discontinued  Telemetry: N/A  Follow-up appointments:N/A  Prescriptions: Scripts given to patient  Transportation: Transported home via car by husband  Belongings:All belongings taken with patient

## 2014-02-12 NOTE — Progress Notes (Signed)
[facility] PROGRESS NOTE  Ariel Meyer ZOX:[id number] DOB: [birth year] DOA: 02/07/2014 PCP: Warrick Parisian, MD Summary 63 y.o female with previous h/o c diff, htn, gerd presented with abdominal pain, several weeks of diarrhea after having taken abx for mastitis, hypotension and acute renal failure. c diff positive.  Assessment/Plan:    C diff colitis.flagyl  N/v: zofran -await labs  Pain between shoulder blades -work up normal, resolved    Acute renal failure resolved secondary to above, resolved   Hypotension resolved   Benign hypertension: blood pressure creeping up. Resume lisinopril   Obesity   GERD (gastroesophageal reflux disease): ppi held due to C diff. Having symptoms. Add carafate   Hyperlipidemia Acute sinusitis: mucinex and flonase  Consultants:    Procedures:     Antibiotics:  flagyl  HPI/Subjective: C/o headache- posterior portion  Objective: Filed Vitals:   02/12/14 0952  BP: 131/95  Pulse: 70  Temp: 98.4 F (36.9 C)  Resp: 18    Intake/Output Summary (Last 24 hours) at 02/12/14 1149 Last data filed at 02/12/14 0825  Gross per 24 hour  Intake    720 ml  Output      0 ml  Net    720 ml   Filed Weights   02/09/14 2137 02/10/14 2212 02/11/14 2010  Weight: 111.131 kg (245 lb) 110.6 kg (243 lb 13.3 oz) 107.956 kg (238 lb)    Exam:   General:  In bed. NAD  Cardiovascular: RRR without MGR  Respiratory: CTA without WRR  Abdomen: S, NT, ND  Ext: no CCE  Basic Metabolic Panel:  Recent Labs Lab 02/07/14 1005 02/08/14 0316 02/09/14 0605 02/11/14 0645 02/12/14 0443  NA 136* 134* 136* 139 139  K 3.8 3.7 3.9 4.2 4.4  CL 96 100 101 103 102  CO2 21 18* 21 24 23   GLUCOSE 139* 103* 111* 103* 106*  BUN 30* 26* 14 7 8   CREATININE 3.11* 1.83* 1.08 0.95 0.91  CALCIUM 8.4 6.9* 7.0* 8.2* 8.4   Liver Function Tests:  Recent Labs Lab 02/07/14 1005 02/12/14 0443  AST 43* 54*  ALT 35 42*  ALKPHOS 91 104  BILITOT 0.8 0.5   PROT 6.9 6.7  ALBUMIN 3.3* 2.8*    Recent Labs Lab 02/07/14 1005 02/11/14 1139  LIPASE 18 11   No results found for this basename: AMMONIA,  in the last 168 hours CBC:  Recent Labs Lab 02/07/14 1005 02/08/14 0316 02/11/14 0645 02/12/14 0529  WBC 12.1* 7.8 6.2 8.0  NEUTROABS 8.5* 4.5  --   --   HGB 15.6* 12.3 12.5 12.4  HCT 45.3 37.0 37.3 37.0  MCV 97.2 99.2 98.9 100.5*  PLT 221 180 185 208   Cardiac Enzymes:  Recent Labs Lab 02/11/14 1139  TROPONINI <0.30   BNP (last 3 results) No results found for this basename: PROBNP,  in the last 8760 hours CBG:  Recent Labs Lab 02/11/14 0825 02/11/14 1214 02/11/14 1726 02/11/14 2008 02/12/14 0810  GLUCAP 95 124* 83 101* 118*    Recent Results (from the past 240 hour(s))  MRSA PCR SCREENING     Status: None   Collection Time    02/07/14  6:00 PM      Result Value Ref Range Status   MRSA by PCR NEGATIVE  NEGATIVE Final   Comment:            The GeneXpert MRSA Assay (FDA     approved for NASAL specimens     only), is one component  of a     comprehensive MRSA colonization     surveillance program. It is not     intended to diagnose MRSA     infection nor to guide or     monitor treatment for     MRSA infections.  CLOSTRIDIUM DIFFICILE BY PCR     Status: Abnormal   Collection Time    02/08/14  2:11 PM      Result Value Ref Range Status   C difficile by pcr POSITIVE (*) NEGATIVE Final   Comment: CRITICAL RESULT CALLED TO, READ BACK BY AND VERIFIED WITH:     K. COOK RN 16:25 02/08/14 (wilsonm)     Studies: No results found.  Scheduled Meds: . FLUoxetine  40 mg Oral Daily  . fluticasone  2 spray Each Nare Daily  . guaiFENesin  600 mg Oral BID  . heparin  5,000 Units Subcutaneous 3 times per day  . insulin aspart  0-9 Units Subcutaneous TID WC  . lidocaine  1 patch Transdermal Q24H  . lisinopril  10 mg Oral Daily  . metroNIDAZOLE  500 mg Oral Q8H  . sodium chloride  3 mL Intravenous Q12H  . sucralfate   1 g Oral TID WC & HS   Continuous Infusions:    Time spent: 25 minutes  Benjamine MolaVANN, JESSICA  [facility] Pager [phone] If 7PM-7AM, please contact night-coverage at [email or web address], password [email or web address] 02/12/2014, 11:49 AM  LOS: 5 days

## 2014-02-24 NOTE — Discharge Summary (Signed)
Physician Discharge Summary  Ariel FruitMalona Meyer OZH:[id number] DOB: 03/15/1951 DOA: 02/07/2014  PCP: Warrick ParisianSTALLINGS,SHEILA, MD  Admit date: 02/07/2014 Discharge date: 02/24/2014  Time spent: 35 minutes  Recommendations for Outpatient Follow-up:  1.  finish flagyl course  Discharge Diagnoses:  Principal Problem:   Enteritis due to Clostridium difficile Active Problems:   Acute renal failure   Hypotension   Benign hypertension   Obesity   GERD (gastroesophageal reflux disease)   Hyperlipidemia   DM (diabetes mellitus), type 2 with renal complications   Acute sinusitis   Nausea with vomiting   Discharge Condition: improved  Diet recommendation: as tolerated  Filed Weights   02/09/14 2137 02/10/14 2212 02/11/14 2010  Weight: 111.131 kg (245 lb) 110.6 kg (243 lb 13.3 oz) 107.956 kg (238 lb)    History of present illness:   Ariel FruitMalona Meyer is a 63 y.o. female  Presents to ED with severe diffuse abdominal pain and bloating. Has had diarrhea on and off for a few weeks, worsened overnight to the point she wore adult diapers. Had fever, chills last night. No vomiting. Appetite ok. Has h/o previous C diff colitis. Was on antibiotics a month or 2 ago for mastitis. Denies sick contacts. No recent travel. drinks well water. Blood pressure in ED has ranged 74/41 - 109/66. Received 2 liters saline and currently systolic 109. Creatinine is 3.11. Previous creatinine about 1. WBC 12,000. CT abdomen/pelvis shows ascites, thickening and irregularity of distal ileal loops, gallbladder sludge or stones. Has felt weak and dizzy with standing  Hospital Course:  C diff colitis.flagyl  N/v: zofran   Acute renal failure resolved secondary to above, resolved  Hypotension resolved  Benign hypertension: blood pressure creeping up. Resume lisinopril  Obesity  GERD (gastroesophageal reflux disease): ppi held due to C diff. Having symptoms. Add carafate  Hyperlipidemia  Acute sinusitis: mucinex and  flonase   Procedures:    Consultations:    Discharge Exam: Filed Vitals:   02/12/14 0952  BP: 131/95  Pulse: 70  Temp: 98.4 F (36.9 C)  Resp: 18    General: A+Ox3  Discharge Instructions You were cared for by a hospitalist during your hospital stay. If you have any questions about your discharge medications or the care you received while you were in the hospital after you are discharged, you can call the unit and asked to speak with the hospitalist on call if the hospitalist that took care of you is not available. Once you are discharged, your primary care physician will handle any further medical issues. Please note that NO REFILLS for any discharge medications will be authorized once you are discharged, as it is imperative that you return to your primary care physician (or establish a relationship with a primary care physician if you do not have one) for your aftercare needs so that they can reassess your need for medications and monitor your lab values.  Discharge Instructions   Discharge instructions    Complete by:  As directed   Advance diet as tolerated     Increase activity slowly    Complete by:  As directed           Discharge Medication List as of 02/12/2014  3:53 PM    START taking these medications   Details  fluticasone (FLONASE) 50 MCG/ACT nasal spray Place 2 sprays into both nostrils daily., Starting 02/12/2014, Until Discontinued, No Print    metroNIDAZOLE (FLAGYL) 500 MG tablet Take 1 tablet (500 mg total) by mouth every 8 (eight) hours.,  Starting 02/12/2014, Until Discontinued, Print      CONTINUE these medications which have NOT CHANGED   Details  alendronate (FOSAMAX) 70 MG tablet Take 70 mg by mouth once a week. Take with a full glass of water on an empty stomach., Until Discontinued, Historical Med    atorvastatin (LIPITOR) 40 MG tablet Take 40 mg by mouth daily., Until Discontinued, Historical Med    cetirizine (ZYRTEC) 10 MG tablet Take 10 mg  by mouth daily., Until Discontinued, Historical Med    cholecalciferol (VITAMIN D) 1000 UNITS tablet Take 1,000 Units by mouth daily., Until Discontinued, Historical Med    FLUoxetine (PROZAC) 40 MG capsule Take 40 mg by mouth daily., Until Discontinued, Historical Med    lisinopril (PRINIVIL,ZESTRIL) 10 MG tablet Take 10 mg by mouth daily., Until Discontinued, Historical Med    metFORMIN (GLUCOPHAGE) 500 MG tablet Take 500 mg by mouth daily., Until Discontinued, Historical Med    Multiple Vitamin (MULTIVITAMIN WITH MINERALS) TABS Take 1 tablet by mouth daily., Until Discontinued, Historical Med    nabumetone (RELAFEN) 500 MG tablet Take 500 mg by mouth 2 (two) times daily. , Starting 10/17/2013, Until Discontinued, Historical Med    omeprazole (PRILOSEC) 40 MG capsule Take 40 mg by mouth daily., Until Discontinued, Historical Med      STOP taking these medications     hydrochlorothiazide (HYDRODIURIL) 25 MG tablet        Allergies  Allergen Reactions  . Dilaudid [Hydromorphone Hcl]     hallucination  . Norvasc [Amlodipine Besylate] Swelling  . Sudafed 12 Hour [Pseudoephedrine Hcl Er] Hypertension  . Sudafed [Pseudoephedrine Hcl] Other (See Comments)    Raises blood pressure  . Sulfa Antibiotics Other (See Comments)    Inflames her mucous membranes  . Acetaminophen Other (See Comments)    Due to liver  . Macrodantin [Nitrofurantoin Macrocrystal] Nausea And Vomiting and Other (See Comments)    Passes out   . Macrodantin [Nitrofurantoin] Rash  . Penicillins Rash      The results of significant diagnostics from this hospitalization (including imaging, microbiology, ancillary and laboratory) are listed below for reference.    Significant Diagnostic Studies: Ct Abdomen Pelvis Wo Contrast  02/07/2014   CLINICAL DATA:  Abdominal pain.  Bloating, diarrhea.  EXAM: CT ABDOMEN AND PELVIS WITHOUT CONTRAST  TECHNIQUE: Multidetector CT imaging of the abdomen and pelvis was performed  following the standard protocol without IV contrast.  COMPARISON:  10/24/2010,1 05/28/2012  FINDINGS: Lower chest: The lung bases are unremarkable in appearance. Heart size is normal. No pericardial effusion. No pleural effusions.  Upper abdomen: There is ascites around the liver and spleen. No focal abnormality identified within the liver, spleen, pancreas, or adrenal glands. The kidneys have a normal appearance. There is layering high attenuation the within the gallbladder consistent with stones or sludge.  Bowel: The stomach has a normal appearance. Terminal ileum is notable for wall thickening and mild irregularity. This involves the distal 20 cm of the ileum. No associated and abscess or perforation. There are numerous colonic diverticula. No acute diverticulitis. The appendix is well seen and has a normal appearance.  Pelvis: The uterus is present. There is free pelvic fluid. No adnexal mass.  Retroperitoneum: No retroperitoneal or mesenteric adenopathy. No evidence for aortic aneurysm. There is atherosclerotic calcification of the abdominal aorta.  Abdominal wall: Unremarkable.  Osseous structures: Previous fracture and vertebroplasty at L1.  IMPRESSION: 1. Ascites. 2. Thickening and irregularity of the distal ileal loops, raising the question of  Crohn's disease. 3. No evidence for abscess or free air. 4. Diverticulosis. 5. Layering sludge sludge or stones within the gallbladder. 6. Old fracture and vertebroplasty at L1.   Electronically Signed   By: Rosalie Gums M.D.   On: 02/07/2014 15:39   Dg Knee Complete 4 Views Left  01/26/2014   CLINICAL DATA:  Bilateral knee pain  EXAM: LEFT KNEE - COMPLETE 4+ VIEW; RIGHT KNEE - COMPLETE 4+ VIEW  COMPARISON:  None.  FINDINGS: No acute fracture or dislocation. 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 No joint effusion. No lytic or sclerotic osseous lesion.  IMPRESSION: No acute osseous injury of the knees.  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   Electronically Signed   By: Elige Ko   On: 01/26/2014 12:51   Dg Knee Complete 4 Views Right  01/26/2014   CLINICAL DATA:  Bilateral knee pain  EXAM: LEFT KNEE - COMPLETE 4+ VIEW; RIGHT KNEE - COMPLETE 4+ VIEW  COMPARISON:  None.  FINDINGS: No acute fracture or dislocation. 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 No joint effusion. No lytic or sclerotic osseous lesion.  IMPRESSION: No acute osseous injury of the knees.  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   Electronically Signed   By: Elige Ko   On: 01/26/2014 12:51   Dg Abd Acute W/chest  02/08/2014   CLINICAL DATA:  Abdominal pain.  Rule out obstruction.  EXAM: ACUTE ABDOMEN SERIES (ABDOMEN 2 VIEW & CHEST 1 VIEW)  COMPARISON:  Abdominal CT 02/07/2014 and chest radiograph 06/08/2013  FINDINGS: Single view of the chest was obtained. Prominent right paratracheal soft tissue is probably related to technique. Heart size is stable. Coarse lung markings appear chronic. Evidence for scarring or pleural thickening along the left upper lung. No evidence of free air on the upright view. There is gas and contrast throughout the colon. No evidence for small bowel distension. There is gas in the rectum. Old compression fracture with bone cement at L1.  IMPRESSION: Nonobstructive bowel gas pattern. There is gas and contrast throughout the colon.  Chronic changes in the lungs.   Electronically Signed   By: Richarda Overlie M.D.   On: 02/08/2014 10:54    Microbiology: No results found for this or any previous visit (from the past 240 hour(s)).   Labs: Basic Metabolic Panel: No results found for this basename: NA, K, CL, CO2, GLUCOSE, BUN, CREATININE, CALCIUM, MG, PHOS,  in the last 168 hours Liver Function Tests: No results found for this basename: AST, ALT, ALKPHOS, BILITOT, PROT, ALBUMIN,  in the last 168 hours No results found for this basename: LIPASE,  AMYLASE,  in the last 168 hours No results found for this basename: AMMONIA,  in the last 168 hours CBC: No results found for this basename: WBC, NEUTROABS, HGB, HCT, MCV, PLT,  in the last 168 hours Cardiac Enzymes: No results found for this basename: CKTOTAL, CKMB, CKMBINDEX, TROPONINI,  in the last 168 hours BNP: BNP (last 3 results) No results found for this basename: PROBNP,  in the last 8760 hours CBG: No results found for this basename: GLUCAP,  in the last 168 hours     Signed:  Marlin Canary  [facility] 02/24/2014, 1:51 PM

## 2014-05-06 ENCOUNTER — Inpatient Hospital Stay (HOSPITAL_COMMUNITY): Payer: BC Managed Care – PPO

## 2014-05-06 ENCOUNTER — Encounter (HOSPITAL_COMMUNITY): Payer: Self-pay | Admitting: Emergency Medicine

## 2014-05-06 ENCOUNTER — Inpatient Hospital Stay (HOSPITAL_COMMUNITY)
Admission: EM | Admit: 2014-05-06 | Discharge: 2014-05-13 | DRG: 208 | Disposition: A | Discharge status: Home / Self Care | Payer: BC Managed Care – PPO | Attending: Internal Medicine | Admitting: Internal Medicine

## 2014-05-06 DIAGNOSIS — T464X5A Adverse effect of angiotensin-converting-enzyme inhibitors, initial encounter: Secondary | ICD-10-CM | POA: Diagnosis present

## 2014-05-06 DIAGNOSIS — E1129 Type 2 diabetes mellitus with other diabetic kidney complication: Secondary | ICD-10-CM | POA: Diagnosis present

## 2014-05-06 DIAGNOSIS — E049 Nontoxic goiter, unspecified: Secondary | ICD-10-CM | POA: Diagnosis present

## 2014-05-06 DIAGNOSIS — Z885 Allergy status to narcotic agent status: Secondary | ICD-10-CM | POA: Diagnosis not present

## 2014-05-06 DIAGNOSIS — T884XXA Failed or difficult intubation, initial encounter: Secondary | ICD-10-CM

## 2014-05-06 DIAGNOSIS — K219 Gastro-esophageal reflux disease without esophagitis: Secondary | ICD-10-CM | POA: Diagnosis present

## 2014-05-06 DIAGNOSIS — M1990 Unspecified osteoarthritis, unspecified site: Secondary | ICD-10-CM | POA: Diagnosis present

## 2014-05-06 DIAGNOSIS — N179 Acute kidney failure, unspecified: Secondary | ICD-10-CM | POA: Diagnosis present

## 2014-05-06 DIAGNOSIS — D649 Anemia, unspecified: Secondary | ICD-10-CM | POA: Diagnosis present

## 2014-05-06 DIAGNOSIS — I129 Hypertensive chronic kidney disease with stage 1 through stage 4 chronic kidney disease, or unspecified chronic kidney disease: Secondary | ICD-10-CM | POA: Diagnosis present

## 2014-05-06 DIAGNOSIS — Z4659 Encounter for fitting and adjustment of other gastrointestinal appliance and device: Secondary | ICD-10-CM

## 2014-05-06 DIAGNOSIS — Z87891 Personal history of nicotine dependence: Secondary | ICD-10-CM

## 2014-05-06 DIAGNOSIS — F10231 Alcohol dependence with withdrawal delirium: Secondary | ICD-10-CM | POA: Diagnosis present

## 2014-05-06 DIAGNOSIS — N189 Chronic kidney disease, unspecified: Secondary | ICD-10-CM | POA: Diagnosis present

## 2014-05-06 DIAGNOSIS — G4730 Sleep apnea, unspecified: Secondary | ICD-10-CM | POA: Diagnosis present

## 2014-05-06 DIAGNOSIS — Z880 Allergy status to penicillin: Secondary | ICD-10-CM

## 2014-05-06 DIAGNOSIS — I10 Essential (primary) hypertension: Secondary | ICD-10-CM | POA: Diagnosis present

## 2014-05-06 DIAGNOSIS — G9341 Metabolic encephalopathy: Secondary | ICD-10-CM | POA: Diagnosis present

## 2014-05-06 DIAGNOSIS — R069 Unspecified abnormalities of breathing: Secondary | ICD-10-CM

## 2014-05-06 DIAGNOSIS — T783XXA Angioneurotic edema, initial encounter: Secondary | ICD-10-CM | POA: Diagnosis present

## 2014-05-06 DIAGNOSIS — R1310 Dysphagia, unspecified: Secondary | ICD-10-CM | POA: Diagnosis present

## 2014-05-06 DIAGNOSIS — E1122 Type 2 diabetes mellitus with diabetic chronic kidney disease: Secondary | ICD-10-CM | POA: Diagnosis present

## 2014-05-06 DIAGNOSIS — J9601 Acute respiratory failure with hypoxia: Secondary | ICD-10-CM | POA: Diagnosis present

## 2014-05-06 DIAGNOSIS — F10931 Alcohol use, unspecified with withdrawal delirium: Secondary | ICD-10-CM

## 2014-05-06 DIAGNOSIS — Z888 Allergy status to other drugs, medicaments and biological substances status: Secondary | ICD-10-CM | POA: Diagnosis not present

## 2014-05-06 DIAGNOSIS — Z882 Allergy status to sulfonamides status: Secondary | ICD-10-CM

## 2014-05-06 DIAGNOSIS — E785 Hyperlipidemia, unspecified: Secondary | ICD-10-CM | POA: Diagnosis present

## 2014-05-06 DIAGNOSIS — J9600 Acute respiratory failure, unspecified whether with hypoxia or hypercapnia: Secondary | ICD-10-CM

## 2014-05-06 DIAGNOSIS — R609 Edema, unspecified: Secondary | ICD-10-CM

## 2014-05-06 DIAGNOSIS — N289 Disorder of kidney and ureter, unspecified: Secondary | ICD-10-CM

## 2014-05-06 DIAGNOSIS — G9340 Encephalopathy, unspecified: Secondary | ICD-10-CM

## 2014-05-06 HISTORY — DX: Anxiety disorder, unspecified: F41.9

## 2014-05-06 HISTORY — DX: Depression, unspecified: F32.A

## 2014-05-06 HISTORY — DX: Chronic kidney disease, unspecified: N18.9

## 2014-05-06 HISTORY — DX: Reserved for inherently not codable concepts without codable children: IMO0001

## 2014-05-06 HISTORY — DX: Major depressive disorder, single episode, unspecified: F32.9

## 2014-05-06 HISTORY — DX: Type 2 diabetes mellitus without complications: E11.9

## 2014-05-06 HISTORY — DX: Pneumonia, unspecified organism: J18.9

## 2014-05-06 HISTORY — DX: Sleep apnea, unspecified: G47.30

## 2014-05-06 LAB — CBC WITH DIFFERENTIAL/PLATELET
Basophils Absolute: 0 10*3/uL (ref 0.0–0.1)
Basophils Relative: 0 % (ref 0–1)
Eosinophils Absolute: 0.3 10*3/uL (ref 0.0–0.7)
Eosinophils Relative: 4 % (ref 0–5)
HEMATOCRIT: 36.8 % (ref 36.0–46.0)
Hemoglobin: 12.1 g/dL (ref 12.0–15.0)
Lymphocytes Relative: 28 % (ref 12–46)
Lymphs Abs: 2.5 10*3/uL (ref 0.7–4.0)
MCH: 32.7 pg (ref 26.0–34.0)
MCHC: 32.9 g/dL (ref 30.0–36.0)
MCV: 99.5 fL (ref 78.0–100.0)
MONOS PCT: 8 % (ref 3–12)
Monocytes Absolute: 0.7 10*3/uL (ref 0.1–1.0)
NEUTROS ABS: 5.4 10*3/uL (ref 1.7–7.7)
NEUTROS PCT: 60 % (ref 43–77)
Platelets: 216 10*3/uL (ref 150–400)
RBC: 3.7 MIL/uL — AB (ref 3.87–5.11)
RDW: 13.4 % (ref 11.5–15.5)
WBC: 9 10*3/uL (ref 4.0–10.5)

## 2014-05-06 LAB — BASIC METABOLIC PANEL
Anion gap: 10 (ref 5–15)
BUN: 40 mg/dL — AB (ref 6–23)
CHLORIDE: 106 meq/L (ref 96–112)
CO2: 23 mmol/L (ref 19–32)
Calcium: 8.9 mg/dL (ref 8.4–10.5)
Creatinine, Ser: 2.3 mg/dL — ABNORMAL HIGH (ref 0.50–1.10)
GFR calc Af Amer: 25 mL/min — ABNORMAL LOW (ref >90)
GFR calc non Af Amer: 21 mL/min — ABNORMAL LOW (ref >90)
GLUCOSE: 115 mg/dL — AB (ref 70–99)
POTASSIUM: 4.5 mmol/L (ref 3.5–5.1)
Sodium: 139 mmol/L (ref 135–145)

## 2014-05-06 LAB — MRSA PCR SCREENING: MRSA by PCR: NEGATIVE

## 2014-05-06 LAB — TSH: TSH: 2.301 u[IU]/mL (ref 0.350–4.500)

## 2014-05-06 MED ORDER — ENOXAPARIN SODIUM 30 MG/0.3ML ~~LOC~~ SOLN
30.0000 mg | SUBCUTANEOUS | Status: DC
  Administered 2014-05-07 – 2014-05-09 (×3): 30 mg via SUBCUTANEOUS
  Filled 2014-05-06 (×4): qty 0.3

## 2014-05-06 MED ORDER — FLUOXETINE HCL 40 MG PO CAPS: 40.0000 mg | ORAL_CAPSULE | Freq: Every day | ORAL | Status: DC

## 2014-05-06 MED ORDER — SODIUM CHLORIDE 0.9 % IJ SOLN
3.0000 mL | Freq: Two times a day (BID) | INTRAMUSCULAR | Status: DC
  Administered 2014-05-07 – 2014-05-12 (×8): 3 mL via INTRAVENOUS

## 2014-05-06 MED ORDER — SODIUM CHLORIDE 0.9 % IV SOLN: INTRAVENOUS | Status: DC

## 2014-05-06 MED ORDER — PANTOPRAZOLE SODIUM 40 MG IV SOLR
40.0000 mg | Freq: Once | INTRAVENOUS | Status: AC
Start: 2014-05-06 — End: 2014-05-06
  Administered 2014-05-06: 40 mg via INTRAVENOUS
  Filled 2014-05-06: qty 40

## 2014-05-06 MED ORDER — DIPHENHYDRAMINE HCL 50 MG/ML IJ SOLN
25.0000 mg | Freq: Once | INTRAMUSCULAR | Status: AC
  Administered 2014-05-06: 25 mg via INTRAVENOUS
  Filled 2014-05-06: qty 1

## 2014-05-06 MED ORDER — DIPHENHYDRAMINE HCL 50 MG/ML IJ SOLN
25.0000 mg | Freq: Four times a day (QID) | INTRAMUSCULAR | Status: DC
  Administered 2014-05-06 – 2014-05-07 (×2): 25 mg via INTRAVENOUS
  Filled 2014-05-06 (×4): qty 0.5
  Filled 2014-05-06: qty 1

## 2014-05-06 MED ORDER — SODIUM CHLORIDE 0.9 % IV BOLUS (SEPSIS)
500.0000 mL | Freq: Once | INTRAVENOUS | Status: AC
  Administered 2014-05-06: 500 mL via INTRAVENOUS

## 2014-05-06 MED ORDER — LORATADINE 10 MG PO TABS
10.0000 mg | ORAL_TABLET | Freq: Every day | ORAL | Status: DC
Start: 2014-05-06 — End: 2014-05-07

## 2014-05-06 MED ORDER — MORPHINE SULFATE 2 MG/ML IJ SOLN
1.0000 mg | INTRAMUSCULAR | Status: DC | PRN
  Administered 2014-05-06: 1 mg via INTRAVENOUS
  Filled 2014-05-06: qty 1

## 2014-05-06 MED ORDER — ONDANSETRON HCL 4 MG/2ML IJ SOLN
4.0000 mg | Freq: Four times a day (QID) | INTRAMUSCULAR | Status: DC | PRN
  Administered 2014-05-09: 4 mg via INTRAVENOUS
  Filled 2014-05-06: qty 2

## 2014-05-06 MED ORDER — METHYLPREDNISOLONE SODIUM SUCC 125 MG IJ SOLR
125.0000 mg | Freq: Four times a day (QID) | INTRAMUSCULAR | Status: DC
  Administered 2014-05-07: 125 mg via INTRAVENOUS
  Filled 2014-05-06 (×2): qty 2

## 2014-05-06 MED ORDER — DIPHENHYDRAMINE HCL 50 MG/ML IJ SOLN
INTRAMUSCULAR | Status: AC
  Filled 2014-05-06: qty 1

## 2014-05-06 MED ORDER — LORAZEPAM 2 MG/ML IJ SOLN
1.0000 mg | Freq: Once | INTRAMUSCULAR | Status: AC
  Administered 2014-05-06: 1 mg via INTRAVENOUS
  Filled 2014-05-06: qty 1

## 2014-05-06 MED ORDER — SODIUM CHLORIDE 0.9 % IV SOLN
INTRAVENOUS | Status: DC
  Administered 2014-05-06: 19:00:00 via INTRAVENOUS

## 2014-05-06 MED ORDER — HYDROCHLOROTHIAZIDE 25 MG PO TABS
25.0000 mg | ORAL_TABLET | Freq: Every day | ORAL | Status: DC
  Filled 2014-05-06 (×2): qty 1

## 2014-05-06 MED ORDER — ENOXAPARIN SODIUM 30 MG/0.3ML ~~LOC~~ SOLN
30.0000 mg | SUBCUTANEOUS | Status: DC
  Administered 2014-05-06: 30 mg via SUBCUTANEOUS
  Filled 2014-05-06: qty 0.3

## 2014-05-06 MED ORDER — VITAMIN D3 25 MCG (1000 UNIT) PO TABS
1000.0000 [IU] | ORAL_TABLET | Freq: Every day | ORAL | Status: DC
  Administered 2014-05-07 – 2014-05-13 (×7): 1000 [IU] via ORAL
  Filled 2014-05-06 (×10): qty 1

## 2014-05-06 MED ORDER — ONDANSETRON HCL 4 MG PO TABS: 4.0000 mg | ORAL_TABLET | Freq: Four times a day (QID) | ORAL | Status: DC | PRN

## 2014-05-06 MED ORDER — METHYLPREDNISOLONE SODIUM SUCC 125 MG IJ SOLR
125.0000 mg | Freq: Once | INTRAMUSCULAR | Status: AC
  Administered 2014-05-06: 125 mg via INTRAVENOUS
  Filled 2014-05-06: qty 2

## 2014-05-06 MED ORDER — PANTOPRAZOLE SODIUM 40 MG PO TBEC
40.0000 mg | DELAYED_RELEASE_TABLET | Freq: Every day | ORAL | Status: DC
Start: 2014-05-06 — End: 2014-05-07

## 2014-05-06 MED ORDER — FAMOTIDINE IN NACL 20-0.9 MG/50ML-% IV SOLN
20.0000 mg | Freq: Once | INTRAVENOUS | Status: AC
  Administered 2014-05-06: 20 mg via INTRAVENOUS
  Filled 2014-05-06: qty 50

## 2014-05-06 MED ORDER — FLUOXETINE HCL 40 MG PO CAPS
40.0000 mg | ORAL_CAPSULE | Freq: Every day | ORAL | Status: DC
  Filled 2014-05-06 (×3): qty 1

## 2014-05-06 MED ORDER — FLUTICASONE PROPIONATE 50 MCG/ACT NA SUSP
2.0000 | Freq: Every day | NASAL | Status: DC
  Filled 2014-05-06: qty 16

## 2014-05-06 MED ORDER — ATORVASTATIN CALCIUM 40 MG PO TABS
40.0000 mg | ORAL_TABLET | Freq: Every day | ORAL | Status: DC
  Filled 2014-05-06 (×3): qty 1

## 2014-05-06 MED ORDER — INSULIN ASPART 100 UNIT/ML ~~LOC~~ SOLN: 0.0000 [IU] | Freq: Every day | SUBCUTANEOUS | Status: DC

## 2014-05-06 MED ORDER — FLUOXETINE HCL 20 MG PO CAPS: 40.0000 mg | ORAL_CAPSULE | Freq: Every day | ORAL | Status: DC

## 2014-05-06 MED ORDER — LORAZEPAM 2 MG/ML IJ SOLN
0.5000 mg | Freq: Four times a day (QID) | INTRAMUSCULAR | Status: DC | PRN
  Administered 2014-05-07: 0.5 mg via INTRAVENOUS
  Filled 2014-05-06: qty 1

## 2014-05-06 MED ORDER — INSULIN ASPART 100 UNIT/ML ~~LOC~~ SOLN
0.0000 [IU] | Freq: Four times a day (QID) | SUBCUTANEOUS | Status: DC
  Administered 2014-05-07: 3 [IU] via SUBCUTANEOUS

## 2014-05-06 NOTE — ED Provider Notes (Signed)
CSN: [id number]     Arrival date & time 05/06/14  1535 History  This chart was scribed for American Expressathan R. Rubin PayorPickering, MD by Annye AsaAnna Dorsett, ED Scribe. This patient was seen in room APA16A/APA16A and the patient's care was started at 3:56 PM.    Chief Complaint  Patient presents with  . Dysphagia   The history is provided by the patient. No language interpreter was used.     HPI Comments: Paulo FruitMalona Hipp is a 63 y.o. female who presents to the Emergency Department complaining of dysphagia. Patient explains that she has had a cough for about 2 months and woke from a nap this afternoon "feeling like her throat was closing." She explains that she feels her breathing is fine in her lungs - she believes the issue is in her throat. She also reports swelling under her jaw. She denies any new experiences today; no new exposures.   Patient is taking Lisinopril; she took her dose before her nap today.   Patient has a history of allergies; she had a similar reaction to today's symptoms when ingesting freshwater fish.   Past Medical History  Diagnosis Date  . Hypertension   . Back problem   . Complication of anesthesia     wakes up durning surgery  . GERD (gastroesophageal reflux disease)   . Arthritis   . Seasonal allergies   . Dizziness   . Hyperlipidemia    Past Surgical History  Procedure Laterality Date  . Tubal ligation    . Nasal sinus surgery    . Tonsillectomy    . Kyphoplasty N/A 08/12/2012    Procedure: KYPHOPLASTY;  Surgeon: Clydene FakeJames R Hirsch, MD;  Location: [facility];  Service: Neurosurgery;  Laterality: N/A;  L1 Balloon Kyphoplasty  . Back surgery     Family History  Problem Relation Age of Onset  . Heart disease    . Arthritis     History  Substance Use Topics  . Smoking status: Former Smoker -- 1.00 packs/day for 3 years    Types: Cigarettes    Quit date: 05/14/2009  . Smokeless tobacco: Not on file  . Alcohol Use: 7.0 oz/week    14 drink(s) per week   OB History    No  data available     Review of Systems  HENT:       Tongue and throat swelling  Respiratory: Positive for cough.   All other systems reviewed and are negative.   Allergies  Lisinopril; Dilaudid; Norvasc; Sudafed; Sudafed 12 hour; Sulfa antibiotics; Acetaminophen; Macrodantin; Macrodantin; and Penicillins  Home Medications   Prior to Admission medications   Medication Sig Start Date End Date Taking? Authorizing Provider  atorvastatin (LIPITOR) 40 MG tablet Take 40 mg by mouth daily.   Yes Historical Provider, MD  cetirizine (ZYRTEC) 10 MG tablet Take 10 mg by mouth daily.   Yes Historical Provider, MD  cholecalciferol (VITAMIN D) 1000 UNITS tablet Take 1,000 Units by mouth daily.   Yes Historical Provider, MD  FLUoxetine (PROZAC) 40 MG capsule Take 40 mg by mouth daily.   Yes Historical Provider, MD  fluticasone (FLONASE) 50 MCG/ACT nasal spray Place 2 sprays into both nostrils daily. 02/12/14  Yes Joseph ArtJessica U Vann, DO  hydrochlorothiazide (HYDRODIURIL) 25 MG tablet Take 25 mg by mouth daily. 01/28/14  Yes Historical Provider, MD  lisinopril (PRINIVIL,ZESTRIL) 10 MG tablet Take 10 mg by mouth daily.   Yes Historical Provider, MD  metFORMIN (GLUCOPHAGE) 500 MG tablet Take 500 mg by  mouth at bedtime.    Yes Historical Provider, MD  Multiple Vitamin (MULTIVITAMIN WITH MINERALS) TABS Take 1 tablet by mouth daily.   Yes Historical Provider, MD  omeprazole (PRILOSEC) 40 MG capsule Take 40 mg by mouth daily.   Yes Historical Provider, MD  alendronate (FOSAMAX) 70 MG tablet Take 70 mg by mouth once a week. Take with a full glass of water on an empty stomach.    Historical Provider, MD  metroNIDAZOLE (FLAGYL) 500 MG tablet Take 1 tablet (500 mg total) by mouth every 8 (eight) hours. Patient not taking: Reported on 05/06/2014 02/12/14   Selinda OrionJessica U Vann, DO   BP 119/91 mmHg  Pulse 106  Temp(Src) 97.7 F (36.5 C) (Oral)  Resp 23  Ht 5\' 9"  (1.753 m)  Wt 210 lb (95.255 kg)  BMI 31.00 kg/m2  SpO2  95% Physical Exam  Constitutional: She is oriented to person, place, and time. She appears well-developed.  HENT:  Head: Normocephalic.  Angioedema of uvula, the base of her mouth, and anterior neck; no lip swelling; no stridor.   Eyes: Conjunctivae and EOM are normal. No scleral icterus.  Neck: Neck supple. No thyromegaly present.  Cardiovascular: Regular rhythm.  Exam reveals no gallop and no friction rub.   No murmur heard. Slightly tachycardic  Pulmonary/Chest: No stridor. She has no wheezes. She has no rales. She exhibits no tenderness.  Slightly tachypnic  Abdominal: She exhibits no distension. There is no tenderness. There is no rebound.  Musculoskeletal: Normal range of motion. She exhibits no edema.  Lymphadenopathy:    She has no cervical adenopathy.  Neurological: She is oriented to person, place, and time. She exhibits normal muscle tone. Coordination normal.  Skin: No rash noted. No erythema.  Psychiatric: Her behavior is normal.  Anxious    ED Course  Procedures   DIAGNOSTIC STUDIES: Oxygen Saturation is 96% on RA, adequate by my interpretation.    COORDINATION OF CARE: 4:01 PM Discussed treatment plan with pt at bedside and pt agreed to plan.   Labs Review Labs Reviewed  CBC WITH DIFFERENTIAL - Abnormal; Notable for the following:    RBC 3.70 (*)    All other components within normal limits  BASIC METABOLIC PANEL - Abnormal; Notable for the following:    Glucose, Bld 115 (*)    BUN 40 (*)    Creatinine, Ser 2.30 (*)    GFR calc non Af Amer 21 (*)    GFR calc Af Amer 25 (*)    All other components within normal limits  TSH  COMPREHENSIVE METABOLIC PANEL  CBC    Imaging Review No results found.   EKG Interpretation None      MDM   Final diagnoses:  Angioedema, initial encounter  Renal insufficiency  Edema   Patient with angioedema of her throat and posterior pharynx. Some swelling below her tongue. She has been stable for over 2 hours in  the ER. She has had some difficulty swallowing but is able to handle her secretions. Does not appear to need transfer to a location with more ENT coverage.creatinine is also increased. Unrelated questioning she states she's had diarrhea for the last few days. She's had elevated creatinine in the past. Will admit to internal medicine. Patient is on lisinopril. Discussed with Dr. Karilyn CotaGosrani.  I personally performed the services described in this documentation, which was scribed in my presence. The recorded information has been reviewed and is accurate.  CRITICAL CARE Performed by: Billee CashingPICKERING,Aquarius Tremper R. Total critical  care time: 30 Critical care time was exclusive of separately billable procedures and treating other patients. Critical care was necessary to treat or prevent imminent or life-threatening deterioration. Critical care was time spent personally by me on the following activities: development of treatment plan with patient and/or surrogate as well as nursing, discussions with consultants, evaluation of patient's response to treatment, examination of patient, obtaining history from patient or surrogate, ordering and performing treatments and interventions, ordering and review of laboratory studies, ordering and review of radiographic studies, pulse oximetry and re-evaluation of patient's condition.    Juliet Rude. Rubin Payor, MD 05/06/14 1921

## 2014-05-06 NOTE — H&P (Signed)
[facility] History and Physical  Ariel Meyer ZOX:[id number] DOB: 07/25/1950 DOA: 05/06/2014  Referring physician: ER PCP: Warrick ParisianSTALLINGS,SHEILA, MD   Chief Complaint: Neck swelling, difficulty with breathing and swallowing  HPI: Ariel FruitMalona Matsumoto is a 63 y.o. female  This is a 63 year old lady who was coughing excessively last night and then today after she had taken a nap at approximately 2:30 PM she started coughing and choking as if she could not swallow/breathe. She came to the emergency room. She has been on lisinopril for approximately 2 months and has been coughing as a new symptom for the last 2 months or so. Lisinopril has been continued by the primary care physician. She denies any fever. She does not admit to any dyspnea apart from a choking sensation. She has not been lightheaded or dizzy. She has no chest pain or palpitations. She is diabetic. Evaluation in the emergency room showed it to be in acute renal failure, blood pressure was maintained and it was felt that she had angioedema. She is now being admitted for further management.   Review of Systems:  Apart from symptoms above, all other systems negative.  Past Medical History  Diagnosis Date  . Hypertension   . Back problem   . Complication of anesthesia     wakes up durning surgery  . GERD (gastroesophageal reflux disease)   . Arthritis   . Seasonal allergies   . Dizziness   . Hyperlipidemia    Past Surgical History  Procedure Laterality Date  . Tubal ligation    . Nasal sinus surgery    . Tonsillectomy    . Kyphoplasty N/A 08/12/2012    Procedure: KYPHOPLASTY;  Surgeon: Clydene FakeJames R Hirsch, MD;  Location: [facility];  Service: Neurosurgery;  Laterality: N/A;  L1 Balloon Kyphoplasty  . Back surgery     Social History:  reports that she quit smoking about 4 years ago. Her smoking use included Cigarettes. She has a 3 pack-year smoking history. She does not have any smokeless tobacco history on file. She reports  that she drinks about 7.0 oz of alcohol per week. She reports that she does not use illicit drugs.  Allergies  Allergen Reactions  . Lisinopril Swelling  . Dilaudid [Hydromorphone Hcl]     hallucination  . Norvasc [Amlodipine Besylate] Swelling  . Sudafed 12 Hour [Pseudoephedrine Hcl Er] Hypertension  . Sudafed [Pseudoephedrine Hcl] Other (See Comments)    Raises blood pressure  . Sulfa Antibiotics Other (See Comments)    Inflames her mucous membranes  . Acetaminophen Other (See Comments)    Due to liver  . Macrodantin [Nitrofurantoin Macrocrystal] Nausea And Vomiting and Other (See Comments)    Passes out   . Macrodantin [Nitrofurantoin] Rash  . Penicillins Rash    Family History  Problem Relation Age of Onset  . Heart disease    . Arthritis    No family history of angioedema or thyroid problems.   Prior to Admission medications   Medication Sig Start Date End Date Taking? Authorizing Provider  atorvastatin (LIPITOR) 40 MG tablet Take 40 mg by mouth daily.   Yes Historical Provider, MD  cetirizine (ZYRTEC) 10 MG tablet Take 10 mg by mouth daily.   Yes Historical Provider, MD  cholecalciferol (VITAMIN D) 1000 UNITS tablet Take 1,000 Units by mouth daily.   Yes Historical Provider, MD  FLUoxetine (PROZAC) 40 MG capsule Take 40 mg by mouth daily.   Yes Historical Provider, MD  fluticasone (FLONASE) 50 MCG/ACT nasal spray Place  2 sprays into both nostrils daily. 02/12/14  Yes Joseph ArtJessica U Vann, DO  hydrochlorothiazide (HYDRODIURIL) 25 MG tablet Take 25 mg by mouth daily. 01/28/14  Yes Historical Provider, MD  lisinopril (PRINIVIL,ZESTRIL) 10 MG tablet Take 10 mg by mouth daily.   Yes Historical Provider, MD  metFORMIN (GLUCOPHAGE) 500 MG tablet Take 500 mg by mouth at bedtime.    Yes Historical Provider, MD  Multiple Vitamin (MULTIVITAMIN WITH MINERALS) TABS Take 1 tablet by mouth daily.   Yes Historical Provider, MD  omeprazole (PRILOSEC) 40 MG capsule Take 40 mg by mouth daily.   Yes  Historical Provider, MD  alendronate (FOSAMAX) 70 MG tablet Take 70 mg by mouth once a week. Take with a full glass of water on an empty stomach.    Historical Provider, MD  metroNIDAZOLE (FLAGYL) 500 MG tablet Take 1 tablet (500 mg total) by mouth every 8 (eight) hours. Patient not taking: Reported on 05/06/2014 02/12/14   Joseph ArtJessica U Vann, DO   Physical Exam: Filed Vitals:   05/06/14 1630 05/06/14 1700 05/06/14 1730 05/06/14 1800  BP: 119/63 112/74 114/84 119/91  Pulse: 106 100 104 106  Temp:      TempSrc:      Resp: 23 12 24 23   Height:      Weight:      SpO2: 97% 96% 95% 95%    Wt Readings from Last 3 Encounters:  05/06/14 95.255 kg (210 lb)  02/11/14 107.956 kg (238 lb)  11/17/13 97.523 kg (215 lb)    General:  Appears calm and comfortable. There is no increased work of breathing. There is no stridor. There is no peripheral central cyanosis. Eyes: PERRL, normal lids, irises & conjunctiva ENT: Tongue appears slightly swollen. Lips are slightly swollen. Neck: There is a swelling in the anterior aspect of the neck which moves with swallowing, I suspect this may be a goiter in addition to her edematous process in her throat. Cardiovascular: RRR, no m/r/g. No LE edema. Telemetry: SR, no arrhythmias  Respiratory: CTA bilaterally, no w/r/r. Normal respiratory effort. Abdomen: soft, ntnd Skin: no rash or induration seen on limited exam Musculoskeletal: grossly normal tone BUE/BLE Psychiatric: Anxious. Neurologic: grossly non-focal. alert and orientated.           Labs on Admission:  Basic Metabolic Panel:  Recent Labs Lab 05/06/14 1629  NA 139  K 4.5  CL 106  CO2 23  GLUCOSE 115*  BUN 40*  CREATININE 2.30*  CALCIUM 8.9   Liver Function Tests: No results for input(s): AST, ALT, ALKPHOS, BILITOT, PROT, ALBUMIN in the last 168 hours. No results for input(s): LIPASE, AMYLASE in the last 168 hours. No results for input(s): AMMONIA in the last 168 hours. CBC:  Recent  Labs Lab 05/06/14 1629  WBC 9.0  NEUTROABS 5.4  HGB 12.1  HCT 36.8  MCV 99.5  PLT 216   Cardiac Enzymes: No results for input(s): CKTOTAL, CKMB, CKMBINDEX, TROPONINI in the last 168 hours.  BNP (last 3 results) No results for input(s): PROBNP in the last 8760 hours. CBG: No results for input(s): GLUCAP in the last 168 hours.  Radiological Exams on Admission: No results found.  EKG: Independently reviewed. Sinus tachycardia, no acute ST-T wave changes.  Assessment/Plan   1. Probable angioedema secondary to lisinopril. She does not have upper airways obstruction at the present time and there does not appear to be difficulty with breathing. We will treat her with intravenous steroids and hold lisinopril. We will admitted to  stepdown unit where she can be closely monitored. 2. Possible goiter-ultrasound of the neck. 3. Acute renal failure-likely secondary to ACE inhibitor and poor by mouth intake. Treat with intravenous fluids and hold lisinopril. I have listed lisinopril as an allergy. 4. Diabetes-monitor and control with sliding scale of insulin. 5. Hypertension-currently normotensive. Will monitor closely.  Further recommendations will depend on patient's hospital progress.   Code Status: Full code   DVT Prophylaxis: Lovenox  Family Communication: Discussed the plan with the patient at the bedside.  Disposition Plan: Home when medically stable.  Time spent: 60 minutes.  Wilson Singer [facility] Pager [phone].

## 2014-05-06 NOTE — ED Notes (Addendum)
Pt reports has had dry cough x2 months. Pt reports woke up this afternoon after taking a nap and reports "my throat feels like its closing. " upon assessment airway patent. nad noted.

## 2014-05-06 NOTE — ED Notes (Signed)
Patient states she doesn't feel she is swallowing any better.  Sublingual space less swollen, but uvula and pharynx appears unchanged.  O 2 sats stable.  Dr. Rubin PayorPickering notified.

## 2014-05-07 ENCOUNTER — Inpatient Hospital Stay (HOSPITAL_COMMUNITY): Payer: BC Managed Care – PPO | Admitting: Anesthesiology

## 2014-05-07 ENCOUNTER — Inpatient Hospital Stay (HOSPITAL_COMMUNITY): Payer: BC Managed Care – PPO

## 2014-05-07 ENCOUNTER — Encounter (HOSPITAL_COMMUNITY): Payer: Self-pay | Admitting: *Deleted

## 2014-05-07 DIAGNOSIS — T783XXS Angioneurotic edema, sequela: Secondary | ICD-10-CM

## 2014-05-07 DIAGNOSIS — J9601 Acute respiratory failure with hypoxia: Principal | ICD-10-CM

## 2014-05-07 DIAGNOSIS — E1129 Type 2 diabetes mellitus with other diabetic kidney complication: Secondary | ICD-10-CM

## 2014-05-07 DIAGNOSIS — I10 Essential (primary) hypertension: Secondary | ICD-10-CM

## 2014-05-07 LAB — COMPREHENSIVE METABOLIC PANEL
ALT: 33 U/L (ref 0–35)
AST: 33 U/L (ref 0–37)
Albumin: 3.4 g/dL — ABNORMAL LOW (ref 3.5–5.2)
Alkaline Phosphatase: 78 U/L (ref 39–117)
Anion gap: 12 (ref 5–15)
BUN: 41 mg/dL — ABNORMAL HIGH (ref 6–23)
CO2: 18 mmol/L — AB (ref 19–32)
CREATININE: 2.19 mg/dL — AB (ref 0.50–1.10)
Calcium: 8.3 mg/dL — ABNORMAL LOW (ref 8.4–10.5)
Chloride: 108 mEq/L (ref 96–112)
GFR calc Af Amer: 26 mL/min — ABNORMAL LOW (ref >90)
GFR, EST NON AFRICAN AMERICAN: 23 mL/min — AB (ref >90)
Glucose, Bld: 233 mg/dL — ABNORMAL HIGH (ref 70–99)
Potassium: 4.7 mmol/L (ref 3.5–5.1)
Sodium: 138 mmol/L (ref 135–145)
Total Bilirubin: 0.8 mg/dL (ref 0.3–1.2)
Total Protein: 6.7 g/dL (ref 6.0–8.3)

## 2014-05-07 LAB — GLUCOSE, CAPILLARY
GLUCOSE-CAPILLARY: 170 mg/dL — AB (ref 70–99)
Glucose-Capillary: 154 mg/dL — ABNORMAL HIGH (ref 70–99)
Glucose-Capillary: 163 mg/dL — ABNORMAL HIGH (ref 70–99)
Glucose-Capillary: 174 mg/dL — ABNORMAL HIGH (ref 70–99)
Glucose-Capillary: 178 mg/dL — ABNORMAL HIGH (ref 70–99)
Glucose-Capillary: 185 mg/dL — ABNORMAL HIGH (ref 70–99)
Glucose-Capillary: 195 mg/dL — ABNORMAL HIGH (ref 70–99)

## 2014-05-07 LAB — CBC
HCT: 35.8 % — ABNORMAL LOW (ref 36.0–46.0)
Hemoglobin: 11.8 g/dL — ABNORMAL LOW (ref 12.0–15.0)
MCH: 32.1 pg (ref 26.0–34.0)
MCHC: 33 g/dL (ref 30.0–36.0)
MCV: 97.3 fL (ref 78.0–100.0)
Platelets: 221 10*3/uL (ref 150–400)
RBC: 3.68 MIL/uL — AB (ref 3.87–5.11)
RDW: 13.3 % (ref 11.5–15.5)
WBC: 12.2 10*3/uL — ABNORMAL HIGH (ref 4.0–10.5)

## 2014-05-07 MED ORDER — CHLORHEXIDINE GLUCONATE 0.12 % MT SOLN
OROMUCOSAL | Status: AC
  Administered 2014-05-07: 15 mL
  Filled 2014-05-07: qty 15

## 2014-05-07 MED ORDER — DIPHENHYDRAMINE HCL 12.5 MG/5ML PO ELIX
25.0000 mg | ORAL_SOLUTION | Freq: Four times a day (QID) | ORAL | Status: DC
  Administered 2014-05-07 – 2014-05-09 (×9): 25 mg
  Filled 2014-05-07 (×13): qty 10

## 2014-05-07 MED ORDER — SODIUM CHLORIDE 0.9 % IV SOLN
25.0000 ug/h | INTRAVENOUS | Status: DC
  Administered 2014-05-07: 100 ug/h via INTRAVENOUS
  Administered 2014-05-08 – 2014-05-09 (×3): 200 ug/h via INTRAVENOUS
  Filled 2014-05-07 (×5): qty 50

## 2014-05-07 MED ORDER — PRO-STAT SUGAR FREE PO LIQD
30.0000 mL | Freq: Two times a day (BID) | ORAL | Status: DC
  Administered 2014-05-07 – 2014-05-08 (×3): 30 mL
  Filled 2014-05-07 (×4): qty 30

## 2014-05-07 MED ORDER — ETOMIDATE 2 MG/ML IV SOLN
INTRAVENOUS | Status: DC | PRN
  Administered 2014-05-07: 8 mg via INTRAVENOUS

## 2014-05-07 MED ORDER — FAMOTIDINE IN NACL 20-0.9 MG/50ML-% IV SOLN
INTRAVENOUS | Status: AC
  Filled 2014-05-07: qty 50

## 2014-05-07 MED ORDER — FENTANYL CITRATE 0.05 MG/ML IJ SOLN
25.0000 ug | INTRAMUSCULAR | Status: DC | PRN
  Administered 2014-05-07 (×4): 100 ug via INTRAVENOUS
  Filled 2014-05-07 (×3): qty 2

## 2014-05-07 MED ORDER — ETOMIDATE 2 MG/ML IV SOLN
INTRAVENOUS | Status: AC
  Administered 2014-05-07: 8 mg
  Filled 2014-05-07: qty 20

## 2014-05-07 MED ORDER — ATORVASTATIN CALCIUM 40 MG PO TABS
40.0000 mg | ORAL_TABLET | Freq: Every day | ORAL | Status: DC
  Administered 2014-05-07 – 2014-05-09 (×3): 40 mg
  Filled 2014-05-07 (×3): qty 1

## 2014-05-07 MED ORDER — VITAL HIGH PROTEIN PO LIQD
1000.0000 mL | ORAL | Status: DC
  Administered 2014-05-07 – 2014-05-08 (×2): 1000 mL
  Filled 2014-05-07 (×3): qty 1000

## 2014-05-07 MED ORDER — HYDRALAZINE HCL 20 MG/ML IJ SOLN: 10.0000 mg | INTRAMUSCULAR | Status: DC | PRN

## 2014-05-07 MED ORDER — FLUOXETINE HCL 40 MG PO CAPS: 20.0000 mg | ORAL_CAPSULE | Freq: Every day | ORAL | Status: DC

## 2014-05-07 MED ORDER — FAMOTIDINE 40 MG/5ML PO SUSR
20.0000 mg | Freq: Two times a day (BID) | ORAL | Status: DC
  Administered 2014-05-07 – 2014-05-09 (×5): 20 mg
  Filled 2014-05-07 (×6): qty 2.5

## 2014-05-07 MED ORDER — FENTANYL CITRATE 0.05 MG/ML IJ SOLN
INTRAMUSCULAR | Status: AC
  Filled 2014-05-07: qty 2

## 2014-05-07 MED ORDER — INSULIN ASPART 100 UNIT/ML ~~LOC~~ SOLN: 0.0000 [IU] | SUBCUTANEOUS | Status: DC

## 2014-05-07 MED ORDER — PROPOFOL 10 MG/ML IV EMUL
5.0000 ug/kg/min | INTRAVENOUS | Status: DC
  Administered 2014-05-07: 5 ug/kg/min via INTRAVENOUS
  Administered 2014-05-07: 50 ug/kg/min via INTRAVENOUS
  Administered 2014-05-07 (×2): 40 ug/kg/min via INTRAVENOUS
  Administered 2014-05-07: 55 ug/kg/min via INTRAVENOUS
  Filled 2014-05-07 (×4): qty 100

## 2014-05-07 MED ORDER — SODIUM CHLORIDE 0.9 % IV BOLUS (SEPSIS): 1000.0000 mL | Freq: Once | INTRAVENOUS | Status: DC

## 2014-05-07 MED ORDER — SUCCINYLCHOLINE CHLORIDE 20 MG/ML IJ SOLN
INTRAMUSCULAR | Status: AC
  Filled 2014-05-07: qty 1

## 2014-05-07 MED ORDER — LIDOCAINE HCL (CARDIAC) 20 MG/ML IV SOLN
INTRAVENOUS | Status: AC
  Filled 2014-05-07: qty 5

## 2014-05-07 MED ORDER — CETYLPYRIDINIUM CHLORIDE 0.05 % MT LIQD
7.0000 mL | Freq: Four times a day (QID) | OROMUCOSAL | Status: DC
  Administered 2014-05-07 – 2014-05-09 (×10): 7 mL via OROMUCOSAL

## 2014-05-07 MED ORDER — DEXMEDETOMIDINE HCL IN NACL 400 MCG/100ML IV SOLN
0.4000 ug/kg/h | INTRAVENOUS | Status: DC
  Administered 2014-05-07 – 2014-05-08 (×4): 1.2 ug/kg/h via INTRAVENOUS
  Administered 2014-05-08 (×3): 1.195 ug/kg/h via INTRAVENOUS
  Administered 2014-05-08: 1.2 ug/kg/h via INTRAVENOUS
  Administered 2014-05-08: 1.195 ug/kg/h via INTRAVENOUS
  Administered 2014-05-08 – 2014-05-09 (×3): 1.2 ug/kg/h via INTRAVENOUS
  Filled 2014-05-07: qty 100
  Filled 2014-05-07: qty 300
  Filled 2014-05-07 (×4): qty 100
  Filled 2014-05-07: qty 200
  Filled 2014-05-07 (×2): qty 100

## 2014-05-07 MED ORDER — SODIUM CHLORIDE 0.45 % IV SOLN
INTRAVENOUS | Status: DC
  Administered 2014-05-07 (×2): via INTRAVENOUS

## 2014-05-07 MED ORDER — FAMOTIDINE IN NACL 20-0.9 MG/50ML-% IV SOLN
20.0000 mg | INTRAVENOUS | Status: AC
  Administered 2014-05-07: 20 mg via INTRAVENOUS
  Filled 2014-05-07: qty 50

## 2014-05-07 MED ORDER — SUCCINYLCHOLINE CHLORIDE 20 MG/ML IJ SOLN
INTRAMUSCULAR | Status: AC
  Administered 2014-05-07: 150 mg
  Filled 2014-05-07: qty 1

## 2014-05-07 MED ORDER — FENTANYL CITRATE 0.05 MG/ML IJ SOLN
100.0000 ug | INTRAMUSCULAR | Status: AC
  Administered 2014-05-07: 100 ug via INTRAVENOUS

## 2014-05-07 MED ORDER — PROPOFOL 10 MG/ML IV EMUL
INTRAVENOUS | Status: AC
  Filled 2014-05-07: qty 100

## 2014-05-07 MED ORDER — RACEPINEPHRINE HCL 2.25 % IN NEBU
0.5000 mL | INHALATION_SOLUTION | Freq: Once | RESPIRATORY_TRACT | Status: AC
  Administered 2014-05-07: 0.5 mL via RESPIRATORY_TRACT

## 2014-05-07 MED ORDER — SODIUM CHLORIDE 0.9 % IV BOLUS (SEPSIS)
1000.0000 mL | Freq: Once | INTRAVENOUS | Status: AC
  Administered 2014-05-07: 1000 mL via INTRAVENOUS

## 2014-05-07 MED ORDER — RACEPINEPHRINE HCL 2.25 % IN NEBU
INHALATION_SOLUTION | RESPIRATORY_TRACT | Status: AC
  Filled 2014-05-07: qty 0.5

## 2014-05-07 MED ORDER — FENTANYL BOLUS VIA INFUSION
50.0000 ug | INTRAVENOUS | Status: DC | PRN
  Administered 2014-05-07: 50 ug via INTRAVENOUS
  Filled 2014-05-07: qty 50

## 2014-05-07 MED ORDER — ATORVASTATIN CALCIUM 40 MG PO TABS: 40.0000 mg | ORAL_TABLET | Freq: Every day | ORAL | Status: DC

## 2014-05-07 MED ORDER — INSULIN ASPART 100 UNIT/ML ~~LOC~~ SOLN
0.0000 [IU] | SUBCUTANEOUS | Status: DC
  Administered 2014-05-07 – 2014-05-08 (×7): 3 [IU] via SUBCUTANEOUS
  Administered 2014-05-08 (×2): 2 [IU] via SUBCUTANEOUS
  Administered 2014-05-08: 3 [IU] via SUBCUTANEOUS
  Administered 2014-05-09: 2 [IU] via SUBCUTANEOUS
  Administered 2014-05-09: 3 [IU] via SUBCUTANEOUS
  Administered 2014-05-09 – 2014-05-13 (×8): 2 [IU] via SUBCUTANEOUS

## 2014-05-07 MED ORDER — METHYLPREDNISOLONE SODIUM SUCC 40 MG IJ SOLR
40.0000 mg | Freq: Two times a day (BID) | INTRAMUSCULAR | Status: DC
Start: 2014-05-07 — End: 2014-05-11
  Administered 2014-05-07 – 2014-05-11 (×8): 40 mg via INTRAVENOUS
  Filled 2014-05-07 (×9): qty 1

## 2014-05-07 MED ORDER — FLUOXETINE HCL 20 MG/5ML PO SOLN
40.0000 mg | Freq: Every day | ORAL | Status: DC
  Administered 2014-05-07 – 2014-05-08 (×2): 40 mg
  Filled 2014-05-07 (×3): qty 10

## 2014-05-07 MED ORDER — ETOMIDATE 2 MG/ML IV SOLN
INTRAVENOUS | Status: AC
  Filled 2014-05-07: qty 10

## 2014-05-07 MED ORDER — DIPHENHYDRAMINE HCL 50 MG/ML IJ SOLN
25.0000 mg | INTRAMUSCULAR | Status: AC
  Administered 2014-05-07: 25 mg via INTRAVENOUS
  Filled 2014-05-07: qty 1

## 2014-05-07 MED ORDER — CHLORHEXIDINE GLUCONATE 0.12 % MT SOLN
15.0000 mL | Freq: Two times a day (BID) | OROMUCOSAL | Status: DC
  Administered 2014-05-07 – 2014-05-09 (×5): 15 mL via OROMUCOSAL
  Filled 2014-05-07 (×4): qty 15

## 2014-05-07 MED ORDER — DEXMEDETOMIDINE HCL IN NACL 200 MCG/50ML IV SOLN
0.4000 ug/kg/h | INTRAVENOUS | Status: DC
  Administered 2014-05-07: 1.2 ug/kg/h via INTRAVENOUS
  Filled 2014-05-07: qty 50

## 2014-05-07 MED ORDER — ROCURONIUM BROMIDE 50 MG/5ML IV SOLN
INTRAVENOUS | Status: AC
  Filled 2014-05-07: qty 2

## 2014-05-07 MED ORDER — METOPROLOL TARTRATE 1 MG/ML IV SOLN
2.5000 mg | INTRAVENOUS | Status: DC | PRN
  Filled 2014-05-07: qty 5

## 2014-05-07 MED ORDER — METHYLPREDNISOLONE SODIUM SUCC 125 MG IJ SOLR
80.0000 mg | Freq: Two times a day (BID) | INTRAMUSCULAR | Status: DC
  Administered 2014-05-07: 80 mg via INTRAVENOUS
  Filled 2014-05-07 (×2): qty 1.28

## 2014-05-07 MED ORDER — SUCCINYLCHOLINE CHLORIDE 20 MG/ML IJ SOLN
INTRAMUSCULAR | Status: DC | PRN
  Administered 2014-05-07: 150 mg via INTRAVENOUS

## 2014-05-07 MED ORDER — FAMOTIDINE IN NACL 20-0.9 MG/50ML-% IV SOLN
20.0000 mg | Freq: Two times a day (BID) | INTRAVENOUS | Status: DC
  Administered 2014-05-07: 20 mg via INTRAVENOUS
  Filled 2014-05-07 (×3): qty 50

## 2014-05-07 NOTE — Progress Notes (Signed)
[facility] and husband Deniece PortelaWayne informed via phone as well

## 2014-05-07 NOTE — Evaluation (Signed)
Received call about pt w/ worsening WOB in setting of ACE induced angioedema. Has had solumdrl IV x2, benadryl IV x2, pepcid IV x1. Pt feels like her throat is closing in on her. Intermittent faint stridor on exam. Malampati score 3-4. Discussed case with Pol[facility] (Simonds). Will intubate via RSI as to protect airway and [facility].

## 2014-05-07 NOTE — Progress Notes (Signed)
Patient had no decrease in her angioedema called Dr. Dorris CarnesN. Gosrani for scheduled Benadryl and Pepcid.  Benadryl 25 mg via IV every 6 hours ordered. Patient Alert and Oriented x 4 in no distress at the time.

## 2014-05-07 NOTE — Anesthesia Postprocedure Evaluation (Signed)
  Anesthesia Post-op Note  Patient: Ariel Meyer  Procedure(s) Performed: * No procedures listed *  Patient Location: ICU  Anesthesia Type:General  Level of Consciousness: sedated  Airway and Oxygen Therapy: Patient remains intubated per anesthesia plan  Post-op Pain: none  Post-op Assessment: Post-op Vital signs reviewed and Patient's Cardiovascular Status Stable  Post-op Vital Signs: Reviewed and stable  Last Vitals:  Filed Vitals:   05/07/14 0207  BP: 113/85  Pulse: 122  Temp:   Resp: 16    Complications: No apparent anesthesia complications

## 2014-05-07 NOTE — Transfer of Care (Signed)
Immediate Anesthesia Transfer of Care Note  Patient: Ariel Meyer  Procedure(s) Performed: * No procedures listed *  Patient Location: ICU  Anesthesia Type:General  Level of Consciousness: sedated  Airway & Oxygen Therapy: Patient remains intubated per anesthesia plan  Post-op Assessment: Post -op Vital signs reviewed and stable  Post vital signs: Reviewed and stable  Complications: No apparent anesthesia complications

## 2014-05-07 NOTE — Progress Notes (Signed)
[facility] arrived for transport via stretcher at 310.  Patient still awake and sedation medication increased as well as pain medication (see MAR) ordered one-time dose given before departure.

## 2014-05-07 NOTE — Progress Notes (Addendum)
Patient complained of difficulty breathing when assessed her airway had closed more.  Patient given supplemental oxygen 2 liters via nasal canula with humidifying bottle.  After Respiratory and I reassessed her once given her second dose of Sol-u-medrol, Dr. Alvester MorinNewton was paged to inform him of her new status change.  Pepcid 20 mg IVPB ordered with [facility] and Dr. Alvester MorinNewton consulting.  Patient being intubated by CRNA due to her medication reaction angioedema.  A second dose of Benadryl 25 mg ordered as well.

## 2014-05-07 NOTE — Progress Notes (Signed)
[facility]

## 2014-05-07 NOTE — Anesthesia Preprocedure Evaluation (Addendum)
Anesthesia Evaluation  Patient identified by MRN, date of birth, ID band Patient awake    Reviewed: NPO status   History of Anesthesia Complications (+) DIFFICULT AIRWAY  Airway Mallampati: III  TM Distance: >3 FB Neck ROM: Full Positive for:  Tracheal deviation   Dental  (+) Teeth Intact   Pulmonary shortness of breath and with exertion, former smoker,  + rhonchi   + wheezing + stridor     Cardiovascular Exercise Tolerance: Good hypertension, Rate:Tachycardia     Neuro/Psych    GI/Hepatic   Endo/Other    Renal/GU      Musculoskeletal   Abdominal   Peds  Hematology   Anesthesia Other Findings   Reproductive/Obstetrics                           Anesthesia Physical Anesthesia Plan  ASA: IV and emergent  Anesthesia Plan: General   Post-op Pain Management:    Induction: Rapid sequence, Intravenous and Cricoid pressure planned  Airway Management Planned: Oral ETT  Additional Equipment:   Intra-op Plan:   Post-operative Plan:   Informed Consent: I have reviewed the patients History and Physical, chart, labs and discussed the procedure including the risks, benefits and alternatives for the proposed anesthesia with the patient or authorized representative who has indicated his/her understanding and acceptance.     Plan Discussed with:   Anesthesia Plan Comments:         Anesthesia Quick Evaluation

## 2014-05-07 NOTE — Anesthesia Procedure Notes (Signed)
Procedure Name: Intubation Date/Time: 05/07/2014 1:36 AM Performed by: Despina HiddenIDACAVAGE, Aarya Robinson J Pre-anesthesia Checklist: Patient identified, Emergency Drugs available, Suction available, Patient being monitored and Timeout performed Patient Re-evaluated:Patient Re-evaluated prior to inductionOxygen Delivery Method: Ambu bag Preoxygenation: Pre-oxygenation with 100% oxygen Intubation Type: IV induction, Rapid sequence and Cricoid Pressure applied Ventilation: Oral airway inserted - appropriate to patient size and Mask ventilation without difficulty Laryngoscope Size: Mac and 3 Grade View: Grade IV Tube type: Oral Tube size: 6.0 mm Number of attempts: 2 Airway Equipment and Method: Stylet Placement Confirmation: breath sounds checked- equal and bilateral,  positive ETCO2 and CO2 detector Secured at: 22 cm Tube secured with: Tape Dental Injury: Teeth and Oropharynx as per pre-operative assessment  Difficulty Due To: Difficulty was anticipated, Difficult Airway-  due to edematous airway and Difficult Airway- due to immobile epiglottis Future Recommendations: Recommend- induction with short-acting agent, and alternative techniques readily available

## 2014-05-07 NOTE — H&P (Signed)
PULMONARY / CRITICAL CARE MEDICINE   Name: Ariel Meyer MRN: [id number] DOB: 05-Jul-1950    ADMISSION DATE:  05/06/2014 CONSULTATION DATE:  05/07/2014  REFERRING MD :  [facility]  CHIEF COMPLAINT:  Angioedema  INITIAL PRESENTATION:  63 y.o. F taken to [facility] 12/24 for cough and difficulty breathing.  She was admitted to SDU for close monitoring.  Later that night, symptoms worsened and pt developed stridor.  She was intubated for angioedema and transferred to [facility] ICU for further management.  STUDIES:  CT Neck 12/24 >>> unilateral angioedema of left pharynx, mild airway narrowing.  SIGNIFICANT EVENTS: 12/24 - presented to [facility] with cough, difficulty breathing, admitted to SDU for angioedema. 12/25 - symptoms worsened, intubated by anesthesia, transferred to [facility] ICU.   HISTORY OF PRESENT ILLNESS:  Pt is encephalopathic; therefore, this HPI is obtained from chart review. Ariel Meyer is a 63 y.o. F with PMH as outlined below.  She presented to [facility] on 12/24 with cough, mild neck swelling, and difficulty breathing and swallowing.  She was started on lisinopril 2 months ago and has had cough since then.  Lisinopril continued by PCP. In ED, it was thought that she had mild angioedema.  She was admitted to the SDU and treated with steroids and H2 blockers.  Later that night, symptoms worsened and pt felt that her throat was closing up.  She was intubated by anesthesia and transferred to [facility] ICU for further management.  Note, per CRNA, pt was a difficult intubation.  PAST MEDICAL HISTORY :   has a past medical history of Hypertension; Back problem; Complication of anesthesia; GERD (gastroesophageal reflux disease); Arthritis; Seasonal allergies; Dizziness; and Hyperlipidemia.  has past surgical history that includes Tubal ligation; Nasal sinus surgery; Tonsillectomy; Kyphoplasty (N/A, 08/12/2012); and Back surgery. Prior to Admission medications   Medication Sig Start Date End Date Taking? Authorizing  Provider  atorvastatin (LIPITOR) 40 MG tablet Take 40 mg by mouth daily.   Yes Historical Provider, MD  cetirizine (ZYRTEC) 10 MG tablet Take 10 mg by mouth daily.   Yes Historical Provider, MD  cholecalciferol (VITAMIN D) 1000 UNITS tablet Take 1,000 Units by mouth daily.   Yes Historical Provider, MD  FLUoxetine (PROZAC) 40 MG capsule Take 40 mg by mouth daily.   Yes Historical Provider, MD  fluticasone (FLONASE) 50 MCG/ACT nasal spray Place 2 sprays into both nostrils daily. 02/12/14  Yes Joseph Art, DO  hydrochlorothiazide (HYDRODIURIL) 25 MG tablet Take 25 mg by mouth daily. 01/28/14  Yes Historical Provider, MD  lisinopril (PRINIVIL,ZESTRIL) 10 MG tablet Take 10 mg by mouth daily.   Yes Historical Provider, MD  metFORMIN (GLUCOPHAGE) 500 MG tablet Take 500 mg by mouth at bedtime.    Yes Historical Provider, MD  Multiple Vitamin (MULTIVITAMIN WITH MINERALS) TABS Take 1 tablet by mouth daily.   Yes Historical Provider, MD  omeprazole (PRILOSEC) 40 MG capsule Take 40 mg by mouth daily.   Yes Historical Provider, MD  alendronate (FOSAMAX) 70 MG tablet Take 70 mg by mouth once a week. Take with a full glass of water on an empty stomach.    Historical Provider, MD  metroNIDAZOLE (FLAGYL) 500 MG tablet Take 1 tablet (500 mg total) by mouth every 8 (eight) hours. Patient not taking: Reported on 05/06/2014 02/12/14   Joseph Art, DO   Allergies  Allergen Reactions  . Lisinopril Swelling  . Dilaudid [Hydromorphone Hcl]     hallucination  . Norvasc [Amlodipine Besylate] Swelling  . Sudafed 12  Hour [Pseudoephedrine Hcl Er] Hypertension  . Sudafed [Pseudoephedrine Hcl] Other (See Comments)    Raises blood pressure  . Sulfa Antibiotics Other (See Comments)    Inflames her mucous membranes  . Acetaminophen Other (See Comments)    Due to liver  . Macrodantin [Nitrofurantoin Macrocrystal] Nausea And Vomiting and Other (See Comments)    Passes out   . Macrodantin [Nitrofurantoin] Rash  .  Penicillins Rash    FAMILY HISTORY:  Family History  Problem Relation Age of Onset  . Heart disease    . Arthritis      SOCIAL HISTORY:  reports that she quit smoking about 4 years ago. Her smoking use included Cigarettes. She has a 3 pack-year smoking history. She does not have any smokeless tobacco history on file. She reports that she drinks about 7.0 oz of alcohol per week. She reports that she does not use illicit drugs.  REVIEW OF SYSTEMS:  Unable to obtain as pt is encephalopathic.  SUBJECTIVE:   VITAL SIGNS: Temp:  [97.7 F (36.5 C)-98.1 F (36.7 C)] 98.1 F (36.7 C) (12/24 2025) Pulse Rate:  [97-128] 111 (12/25 0245) Resp:  [12-26] 19 (12/25 0245) BP: (112-164)/(63-113) 129/99 mmHg (12/25 0230) SpO2:  [92 %-98 %] 98 % (12/25 0245) FiO2 (%):  [40 %-50 %] 40 % (12/25 0228) Weight:  [95.255 kg (210 lb)-99.8 kg (220 lb 0.3 oz)] 99.8 kg (220 lb 0.3 oz) (12/24 2025) HEMODYNAMICS:   VENTILATOR SETTINGS: Vent Mode:  [-] PRVC FiO2 (%):  [40 %-50 %] 40 % Set Rate:  [16 bmp] 16 bmp Vt Set:  [500 mL] 500 mL PEEP:  [5 cmH20] 5 cmH20 Plateau Pressure:  [19 cmH20] 19 cmH20 INTAKE / OUTPUT: Intake/Output      12/24 0701 - 12/25 0700   I.V. (mL/kg) 1390.9 (13.9)   IV Piggyback 508.3   Total Intake(mL/kg) 1899.3 (19)   Urine (mL/kg/hr) 300   Total Output 300   Net +1599.3         PHYSICAL EXAMINATION: General: Obese female, in NAD. Neuro: Sedated, opens eyes to verbal stimuli. HEENT: [place]/AT. PERRL, sclerae anicteric. Cardiovascular: RRR, no M/R/G.  Lungs: Respirations even and unlabored.  CTA bilaterally, No W/R/R.  Abdomen: BS x 4, soft, NT/ND.  Musculoskeletal: No gross deformities, no edema.  Skin: Intact, warm, no rashes.  LABS:  CBC  Recent Labs Lab 05/06/14 1629  WBC 9.0  HGB 12.1  HCT 36.8  PLT 216   Coag's No results for input(s): APTT, INR in the last 168 hours. BMET  Recent Labs Lab 05/06/14 1629  NA 139  K 4.5  CL 106  CO2 23  BUN 40*   CREATININE 2.30*  GLUCOSE 115*   Electrolytes  Recent Labs Lab 05/06/14 1629  CALCIUM 8.9   Sepsis Markers No results for input(s): LATICACIDVEN, PROCALCITON, O2SATVEN in the last 168 hours. ABG No results for input(s): PHART, PCO2ART, PO2ART in the last 168 hours. Liver Enzymes No results for input(s): AST, ALT, ALKPHOS, BILITOT, ALBUMIN in the last 168 hours. Cardiac Enzymes No results for input(s): TROPONINI, PROBNP in the last 168 hours. Glucose  Recent Labs Lab 05/07/14 0036  GLUCAP 163*    Imaging Ct Soft Tissue Neck Wo Contrast  05/06/2014   CLINICAL DATA:  Neck swelling for several hr. Elevated renal function. Coughing for the past 2 months since beginning Linisopril.  EXAM: CT NECK WITHOUT CONTRAST  TECHNIQUE: Multidetector CT imaging of the neck was performed following the standard protocol without intravenous contrast.  COMPARISON:  None.  FINDINGS: There is diffuse edema of the LEFT oropharynx, and hypopharynx extending along the LEFT area epiglottic fold to the supraglottic soft tissues. The soft tissues are quite low in attenuation, between 2 and 10 Hounsfield units. Mild effacement of the airway in the supraglottic region, but no evidence for impending laryngeal closure. The laryngeal true cords and false cords are not involved. Mild edema extends into the parapharyngeal soft tissues on the LEFT, extends inferolaterally to surround the LEFT submandibular gland in the submandibular space as well as the BILATERAL sublingual spaces. No focal fluid collection although there is slight edema in the retropharyngeal space. No significant involving on the RIGHT.  No radiopaque foreign body. No pathologic adenopathy. No intrinsic major or minor salivary gland abnormality. No osseous findings. Normal thyroid. Mild calcific plaque both carotid bifurcations. Cervical spondylosis. Negative visualized intracranial compartment.  IMPRESSION: Noncontrast scan of the neck most consistent  with unilateral angioedema of the LEFT oropharynx, and hypopharynx, extending to the supraglottic soft tissues. Consider ENT consultation for baseline airway evaluation.  Other considerations, such is pharyngitis, or squamous cell neoplasm, are less favored but not excluded. Direct examination could help differentiate.  Mild airway narrowing notably due to mass effect from the LEFT in the supraglottic region, but there does not appear to be impending airway obstruction. The patient should be observed closely for respiratory difficulty.  Findings discussed with ordering provider.   Electronically Signed   By: Davonna BellingJohn  Curnes M.D.   On: 05/06/2014 19:58     ASSESSMENT / PLAN:  PULMONARY ETT 12/25 >>> A: Acute respiratory failure in setting angioedema DIFFICULT INTUBATION PER ANESTHESIA P:   Full mechanical support, wean as able. VAP bundle. Daily SBT if / when able. Continue solumedrol, pepcid, benadryl. CXR in AM. Hold outpatient flonase.  CARDIOVASCULAR A:  Hx HTN, HLD P:  Lisinopril d/c'd and added to allergy list. NO FURTHER LISINOPRIL. Hydralazine, Lopressor PRN. Hold outpatient atorvastatin, HCTZ.  RENAL A:   AKI P:   IV fluids. BMP in AM.  GASTROINTESTINAL A:   GERD Nutrition P:   Pepcid NPO. TF's.  HEMATOLOGIC A:   VTE Prophylaxis P:  SCD's / Lovenox. CBC in AM.  INFECTIOUS A:   No indication for infection P:   Monitor clinically.  ENDOCRINE A:   DM  P:   CBG's q4hr. SSI. Hold outpatient metformin.  NEUROLOGIC A:   Acute metabolic encephalopathy P:   Sedation:  Propofol gtt / Fentanyl PRN. RASS goal: 0 to -1. Daily WUA.  Family updated: None available.  Interdisciplinary Family Meeting v Palliative Care Meeting:  Due by: 01/02   Rutherford Guysahul Desai, PA - [facility] [place] [facility] Pgr: [phone]  or [phone] 05/07/2014, 2:50 AM  Reviewed above, and examined.  63 yo female has non productive cough for  about 4 weeks.  On day of admission she developed difficulty breathing and feeling like her throat was swelling with stridor.  [facility], and concern for angioedema likely from ACE inhibitor.  She required intubation by anesthesia for airway protection >> reported to be difficult intubation.  [facility].  She is tolerating pressure support.  Still has significant tongue swelling.  Breath sounds clear, abd soft, no rashes/hives.  Will continue ETT for now.  Can cycle on PS as tolerated.  Continue solumedrol, pepcid, benadryl for now.  List ACE inhibitor as allergy.  Add tube feeds  while on vent.  Updated pt's family at bedside on 12/25.  CC time by me independent of APP time is 40 minutes.  Coralyn Helling, MD [facility] Pulmonary/Critical Care 05/07/2014, 10:11 AM Pager:  [phone] After 3pm call: [phone]

## 2014-05-08 ENCOUNTER — Inpatient Hospital Stay (HOSPITAL_COMMUNITY): Payer: BC Managed Care – PPO

## 2014-05-08 DIAGNOSIS — N179 Acute kidney failure, unspecified: Secondary | ICD-10-CM

## 2014-05-08 LAB — CBC
HEMATOCRIT: 34.3 % — AB (ref 36.0–46.0)
Hemoglobin: 11.1 g/dL — ABNORMAL LOW (ref 12.0–15.0)
MCH: 32.3 pg (ref 26.0–34.0)
MCHC: 32.4 g/dL (ref 30.0–36.0)
MCV: 99.7 fL (ref 78.0–100.0)
Platelets: 200 10*3/uL (ref 150–400)
RBC: 3.44 MIL/uL — ABNORMAL LOW (ref 3.87–5.11)
RDW: 13.5 % (ref 11.5–15.5)
WBC: 14.2 10*3/uL — AB (ref 4.0–10.5)

## 2014-05-08 LAB — GLUCOSE, CAPILLARY
GLUCOSE-CAPILLARY: 128 mg/dL — AB (ref 70–99)
GLUCOSE-CAPILLARY: 165 mg/dL — AB (ref 70–99)
GLUCOSE-CAPILLARY: 86 mg/dL (ref 70–99)
Glucose-Capillary: 150 mg/dL — ABNORMAL HIGH (ref 70–99)
Glucose-Capillary: 163 mg/dL — ABNORMAL HIGH (ref 70–99)
Glucose-Capillary: 172 mg/dL — ABNORMAL HIGH (ref 70–99)

## 2014-05-08 LAB — BASIC METABOLIC PANEL
Anion gap: 8 (ref 5–15)
BUN: 49 mg/dL — ABNORMAL HIGH (ref 6–23)
CALCIUM: 7.9 mg/dL — AB (ref 8.4–10.5)
CO2: 21 mmol/L (ref 19–32)
Chloride: 108 mEq/L (ref 96–112)
Creatinine, Ser: 1.91 mg/dL — ABNORMAL HIGH (ref 0.50–1.10)
GFR calc Af Amer: 31 mL/min — ABNORMAL LOW (ref >90)
GFR calc non Af Amer: 27 mL/min — ABNORMAL LOW (ref >90)
GLUCOSE: 76 mg/dL (ref 70–99)
Potassium: 4.8 mmol/L (ref 3.5–5.1)
SODIUM: 137 mmol/L (ref 135–145)

## 2014-05-08 MED ORDER — PRO-STAT SUGAR FREE PO LIQD
30.0000 mL | Freq: Three times a day (TID) | ORAL | Status: DC
  Administered 2014-05-08 (×2): 30 mL
  Filled 2014-05-08 (×5): qty 30

## 2014-05-08 MED ORDER — VITAL HIGH PROTEIN PO LIQD
1000.0000 mL | ORAL | Status: DC
  Filled 2014-05-08 (×3): qty 1000

## 2014-05-08 NOTE — Progress Notes (Signed)
INITIAL NUTRITION ASSESSMENT  DOCUMENTATION CODES Per approved criteria  -Obesity Unspecified   INTERVENTION:  Continue TF via OGT with Vital High Protein, increase goal rate to 45 ml/h (1080 ml/day) with Prostat 30 ml TID to provide 1380 kcals (21 kcals/kg ideal weight), 140 gm protein, 903 ml free water daily.  NUTRITION DIAGNOSIS: Inadequate oral intake related to inability to eat as evidenced by NPO status.   Goal: Enteral nutrition to provide 60-70% of estimated calorie needs (22-25 kcals/kg ideal body weight) and 100% of estimated protein needs, based on ASPEN guidelines for hypocaloric, high protein feeding in critically ill obese individuals  Monitor:  TF tolerance/adequacy, weight trend, labs, vent status.  Reason for Assessment: MD Consult for TF initiation and management.  63 y.o. female  Admitting Dx: Angioedema  ASSESSMENT: 63 [facility]. F [facility] cough and difficulty breathing. She was admitted to SDU for close monitoring. Later that night, symptoms worsened and pt developed stridor. She was intubated for angioedema and [facility] for further management.  Adult tube feeding protocol was initiated on 12/25; patient currently receiving Vital High Protein at 40 ml/h with Prostat 30 ml BID providing 1160 kcals, 114 gm protein, 803 ml free water daily.  Patient is currently intubated on ventilator support MV: 9.7 L/min Temp (24hrs), Avg:98.2 F (36.8 C), Min:97.3 F (36.3 C), Max:98.7 F (37.1 C)  Propofol: none  Height: Ht Readings from Last 1 Encounters:  05/06/14 5\' 9"  (1.753 m)    Weight: Wt Readings from Last 1 Encounters:  05/08/14 228 lb 9.9 oz (103.7 kg)    Ideal Body Weight: 65.9 kg  % Ideal Body Weight: 157%  Wt Readings from Last 10 Encounters:  05/08/14 228 lb 9.9 oz (103.7 kg)  02/11/14 238 lb (107.956 kg)  11/17/13 215 lb (97.523 kg)  10/29/13 219 lb 12.8 oz (99.701 kg)  08/11/12 226 lb 10.1 oz (102.8 kg)   07/22/12 220 lb (99.791 kg)  06/23/12 220 lb (99.791 kg)  06/16/12 220 lb (99.791 kg)  06/09/12 220 lb (99.791 kg)  05/28/12 220 lb (99.791 kg)    Usual Body Weight: 215-238 lb  % Usual Body Weight: 100%  BMI:  Body mass index is 33.75 kg/(m^2).  Estimated Nutritional Needs: Kcal: 1865 Protein: 132-150 gm Fluid: 1.8-2 L  Skin: no issues  Diet Order:   NPO  EDUCATION NEEDS: -Education not appropriate at this time   Intake/Output Summary (Last 24 hours) at 05/08/14 0950 Last data filed at 05/08/14 0600  Gross per 24 hour  Intake 3960.9 ml  Output    990 ml  Net 2970.9 ml    Last BM: 12/24   Labs:   Recent Labs Lab 05/06/14 1629 05/07/14 0525 05/08/14 0235  NA 139 138 137  K 4.5 4.7 4.8  CL 106 108 108  CO2 23 18* 21  BUN 40* 41* 49*  CREATININE 2.30* 2.19* 1.91*  CALCIUM 8.9 8.3* 7.9*  GLUCOSE 115* 233* 76    CBG (last 3)   Recent Labs  05/08/14 0002 05/08/14 0430 05/08/14 0705  GLUCAP 172* 128* 165*    Scheduled Meds: . antiseptic oral rinse  7 mL Mouth Rinse QID  . atorvastatin  40 mg Per Tube Daily  . chlorhexidine  15 mL Mouth Rinse BID  . cholecalciferol  1,000 Units Oral Daily  . diphenhydrAMINE  25 mg Per Tube Q6H  . enoxaparin (LOVENOX) injection  30 mg Subcutaneous Q24H  . famotidine  20 mg Per  Tube Q12H  . feeding supplement (PRO-STAT SUGAR FREE 64)  30 mL Per Tube BID  . feeding supplement (VITAL HIGH PROTEIN)  1,000 mL Per Tube Q24H  . FLUoxetine  40 mg Per Tube QHS  . insulin aspart  0-15 Units Subcutaneous 6 times per day  . methylPREDNISolone (SOLU-MEDROL) injection  40 mg Intravenous Q12H  . sodium chloride  3 mL Intravenous Q12H    Continuous Infusions: . sodium chloride 50 mL/hr at 05/07/14 1426  . dexmedetomidine 1.195 mcg/kg/hr (05/08/14 0916)  . fentaNYL infusion INTRAVENOUS 200 mcg/hr (05/08/14 0403)    Past Medical History  Diagnosis Date  . Hypertension   . Back problem   . Complication of anesthesia      wakes up durning surgery  . GERD (gastroesophageal reflux disease)   . Arthritis   . Seasonal allergies   . Dizziness   . Hyperlipidemia   . Sleep apnea   . Shortness of breath dyspnea   . Pneumonia   . Diabetes mellitus without complication   . Depression   . Anxiety   . Chronic kidney disease     Past Surgical History  Procedure Laterality Date  . Tubal ligation    . Nasal sinus surgery    . Tonsillectomy    . Kyphoplasty N/A 08/12/2012    Procedure: KYPHOPLASTY;  Surgeon: Clydene FakeJames R Hirsch, MD;  Location: [facility];  Service: Neurosurgery;  Laterality: N/A;  L1 Balloon Kyphoplasty  . Back surgery      Joaquin CourtsKimberly Shye Doty, RD, LDN, CNSC Pager [phone] After Hours Pager [phone]

## 2014-05-08 NOTE — Progress Notes (Signed)
PULMONARY / CRITICAL CARE MEDICINE   Name: Ariel Meyer MRN: [id number] DOB: 08/20/1950    ADMISSION DATE:  05/06/2014 CONSULTATION DATE:  05/08/2014  REFERRING MD :  [facility]  CHIEF COMPLAINT:  Angioedema  INITIAL PRESENTATION:  63 [facility].o. F [facility] for cough and difficulty breathing.  She was admitted to SDU for close monitoring.  Later that night, symptoms worsened and pt developed stridor.  She was intubated for angioedema and [facility] for [facility] management.  Reported difficult airway per anesthesia.   STUDIES:  CT Neck 12/24 >>> unilateral angioedema of left pharynx, mild airway narrowing.  SIGNIFICANT EVENTS: 12/24  presented to [facility] with cough, difficulty breathing, admitted to SDU for angioedema. 12/25  symptoms worsened, intubated by anesthesia, [facility] 12/26  Remains on precedex @ 1.2, no acute events  SUBJECTIVE:  RN reports no acute events, await SBT/WUA  VITAL SIGNS: Temp:  [97.3 F (36.3 C)-98.7 F (37.1 C)] 98.5 F (36.9 C) (12/26 0738) Pulse Rate:  [58-87] 69 (12/26 0700) Resp:  [8-19] 11 (12/26 0700) BP: (70-123)/(43-81) 117/81 mmHg (12/26 0700) SpO2:  [91 %-98 %] 96 % (12/26 0700) FiO2 (%):  [40 %] 40 % (12/26 0333) Weight:  [228 lb 9.9 oz (103.7 kg)] 228 lb 9.9 oz (103.7 kg) (12/26 0600)   HEMODYNAMICS:     VENTILATOR SETTINGS: Vent Mode:  [-] PRVC FiO2 (%):  [40 %] 40 % Set Rate:  [14 bmp] 14 bmp Vt Set:  [500 mL] 500 mL PEEP:  [5 cmH20] 5 cmH20 Pressure Support:  [5 cmH20] 5 cmH20 Plateau Pressure:  [16 cmH20-21 cmH20] 18 cmH20   INTAKE / OUTPUT: Intake/Output      12/25 0701 - 12/26 0700 12/26 0701 - 12/27 0700   I.V. (mL/kg) 2413.9 (23.3)    NG/GT 875    IV Piggyback 1000    Total Intake(mL/kg) 4288.9 (41.4)    Urine (mL/kg/hr) 1115 (0.4)    Total Output 1115     Net +3173.9            PHYSICAL EXAMINATION: General: Obese female, in NAD. Neuro: Sedated on vent, startles to verbal stimuli HEENT:  [place]/AT. PERRL, sclerae anicteric. Cardiovascular: RRR, no M/R/G.  Lungs: Respirations even and unlabored, prolonged exp phase with soft wheeze  Abdomen: BS x 4, soft, NT/ND.  Musculoskeletal: No gross deformities, no edema.  Skin: Intact, warm, no rashes.  LABS:  CBC  Recent Labs Lab 05/06/14 1629 05/07/14 0525 05/08/14 0235  WBC 9.0 12.2* 14.2*  HGB 12.1 11.8* 11.1*  HCT 36.8 35.8* 34.3*  PLT 216 221 200   BMET  Recent Labs Lab 05/06/14 1629 05/07/14 0525 05/08/14 0235  NA 139 138 137  K 4.5 4.7 4.8  CL 106 108 108  CO2 23 18* 21  BUN 40* 41* 49*  CREATININE 2.30* 2.19* 1.91*  GLUCOSE 115* 233* 76   Electrolytes  Recent Labs Lab 05/06/14 1629 05/07/14 0525 05/08/14 0235  CALCIUM 8.9 8.3* 7.9*   Liver Enzymes  Recent Labs Lab 05/07/14 0525  AST 33  ALT 33  ALKPHOS 78  BILITOT 0.8  ALBUMIN 3.4*   Glucose  Recent Labs Lab 05/07/14 1532 05/07/14 1604 05/07/14 1922 05/08/14 0002 05/08/14 0430 05/08/14 0705  GLUCAP 178* 174* 154* 172* 128* 165*    Imaging Dg Chest 1 View  05/07/2014   CLINICAL DATA:  Endotracheal tube placement. Respiratory distress, acute onset. Initial encounter.  EXAM: CHEST - 1 VIEW  COMPARISON:  Chest  radiograph performed 02/08/2014  FINDINGS: The patient's endotracheal tube is seen ending 5-6 cm above the carina. This could be advanced 2 cm, as deemed clinically appropriate.  The lungs are hypoexpanded. Mild bilateral atelectasis is seen. Vascular crowding is noted. No pleural effusion or pneumothorax is identified.  The cardiomediastinal silhouette is borderline enlarged. There is likely unfolding of the thoracic aorta. No acute osseous abnormalities are seen. The stomach is filled with air.  IMPRESSION: 1. Endotracheal tube seen ending 5-6 cm above the carina. This could be advanced 2 cm, as deemed clinically appropriate. Given lung hypoexpansion, would correlate clinically to ensure that the endotracheal tube is in the  trachea. 2. Lungs hypoexpanded. Mild bilateral atelectasis seen. Borderline cardiomegaly.   Electronically Signed   By: Roanna RaiderJeffery  Chang M.D.   On: 05/07/2014 02:37   Dg Abd Portable 1v  05/07/2014   CLINICAL DATA:  Enteric tube placement.  Initial encounter.  EXAM: PORTABLE ABDOMEN - 1 VIEW  COMPARISON:  Abdominal radiograph performed 02/08/2014  FINDINGS: The patient's enteric tube is noted extending to the region of the pylorus, likely still at the antrum of the stomach, though the tip is not fully characterized.  The visualized bowel gas pattern is grossly unremarkable, though not well assessed due to motion artifact. 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 No acute osseous abnormalities are seen.  IMPRESSION: Enteric tube noted extending to the region of the pylorus, likely still at the antrum of the stomach, though the tip is not fully characterized.   Electronically Signed   By: Roanna RaiderJeffery  Chang M.D.   On: 05/07/2014 05:33     ASSESSMENT / PLAN:  PULMONARY ETT 12/25 >>> A: Acute respiratory failure in setting angioedema DIFFICULT INTUBATION PER ANESTHESIA P:   Full mechanical support, wean as able. VAP bundle. Daily SBT if / when able, consider wean for extubation in am 12/27.  Will need cuff leak assessment  Continue solumedrol, pepcid, benadryl. CXR in AM. Hold outpatient flonase.  CARDIOVASCULAR A:  Hx HTN, HLD P:  Lisinopril d/c'd and added to allergy list. NO FURTHER LISINOPRIL. Hydralazine, Lopressor PRN. Hold outpatient atorvastatin, HCTZ.  RENAL A:   AKI P:   IV fluids:  1/2 NS @ 50 ml/hr. Trend BMP   GASTROINTESTINAL A:   GERD Nutrition P:   Pepcid NPO. TF's.  HEMATOLOGIC A:   Mild Anemia - no overt bleeding VTE Prophylaxis P:  SCD's / Lovenox. CBC in AM.  INFECTIOUS A:   No indication for infection P:   Monitor clinically.  ENDOCRINE A:   DM  P:   CBG's q4hr. SSI. Hold outpatient metformin in setting of  AKI.  NEUROLOGIC A:   Acute metabolic encephalopathy Hx ETOH Abuse  P:   Sedation:  Precedex RASS goal: 0 to -1. Daily WUA. ETOH abuse may complicate weaning / ICU stay, monitor closely for withdrawal  Family updated:  Daughter & SIL updated at bedside 12/26  Interdisciplinary Family Meeting v Palliative Care Meeting:  Due by: 01/02  Canary BrimBrandi Ollis, NP-C [place] [facility] Pgr: [phone] or [phone]  Reviewed above, examined.  Tongue swelling improved, but still present.  Not ready for vent weaning yet.  Will continue sedation and monitor airway.  Continue solumedrol, benadryl, pepcid.  Continue IV fluids.  CC time by me independent of APP time is 35 minutes.  Coralyn HellingVineet William Laske, MD [facility]monary/[facility] 05/08/2014, 11:38 AM Pager:  [phone] After 3pm call: [phone]

## 2014-05-08 NOTE — Progress Notes (Signed)
UR Completed.  [phone]  

## 2014-05-09 ENCOUNTER — Inpatient Hospital Stay (HOSPITAL_COMMUNITY): Payer: BC Managed Care – PPO

## 2014-05-09 LAB — BASIC METABOLIC PANEL
Anion gap: 9 (ref 5–15)
BUN: 49 mg/dL — AB (ref 6–23)
CHLORIDE: 109 meq/L (ref 96–112)
CO2: 19 mmol/L (ref 19–32)
CREATININE: 1.38 mg/dL — AB (ref 0.50–1.10)
Calcium: 8.3 mg/dL — ABNORMAL LOW (ref 8.4–10.5)
GFR calc Af Amer: 46 mL/min — ABNORMAL LOW (ref >90)
GFR calc non Af Amer: 39 mL/min — ABNORMAL LOW (ref >90)
Glucose, Bld: 141 mg/dL — ABNORMAL HIGH (ref 70–99)
Potassium: 5 mmol/L (ref 3.5–5.1)
Sodium: 137 mmol/L (ref 135–145)

## 2014-05-09 LAB — POCT I-STAT 3, ART BLOOD GAS (G3+)
Acid-base deficit: 7 mmol/L — ABNORMAL HIGH (ref 0.0–2.0)
Acid-base deficit: 7 mmol/L — ABNORMAL HIGH (ref 0.0–2.0)
BICARBONATE: 20.4 meq/L (ref 20.0–24.0)
Bicarbonate: 20.5 mEq/L (ref 20.0–24.0)
O2 SAT: 89 %
O2 SAT: 98 %
PH ART: 7.219 — AB (ref 7.350–7.450)
PO2 ART: 67 mmHg — AB (ref 80.0–100.0)
Patient temperature: 98.6
Patient temperature: 98.6
TCO2: 22 mmol/L (ref 0–100)
TCO2: 22 mmol/L (ref 0–100)
pCO2 arterial: 47.5 mmHg — ABNORMAL HIGH (ref 35.0–45.0)
pCO2 arterial: 49.9 mmHg — ABNORMAL HIGH (ref 35.0–45.0)
pH, Arterial: 7.244 — ABNORMAL LOW (ref 7.350–7.450)
pO2, Arterial: 117 mmHg — ABNORMAL HIGH (ref 80.0–100.0)

## 2014-05-09 LAB — CBC
HCT: 33.9 % — ABNORMAL LOW (ref 36.0–46.0)
HEMOGLOBIN: 11 g/dL — AB (ref 12.0–15.0)
MCH: 32.4 pg (ref 26.0–34.0)
MCHC: 32.4 g/dL (ref 30.0–36.0)
MCV: 99.7 fL (ref 78.0–100.0)
Platelets: 158 10*3/uL (ref 150–400)
RBC: 3.4 MIL/uL — AB (ref 3.87–5.11)
RDW: 13.3 % (ref 11.5–15.5)
WBC: 10.7 10*3/uL — AB (ref 4.0–10.5)

## 2014-05-09 LAB — BLOOD GAS, ARTERIAL
ACID-BASE DEFICIT: 3.9 mmol/L — AB (ref 0.0–2.0)
BICARBONATE: 20.2 meq/L (ref 20.0–24.0)
Drawn by: 312761
FIO2: 40 %
O2 SAT: 89.7 %
PATIENT TEMPERATURE: 98.6
PEEP: 5 cmH2O
RATE: 24 resp/min
TCO2: 21.3 mmol/L (ref 0–100)
VT: 550 mL
pCO2 arterial: 34.3 mmHg — ABNORMAL LOW (ref 35.0–45.0)
pH, Arterial: 7.388 (ref 7.350–7.450)
pO2, Arterial: 56.3 mmHg — ABNORMAL LOW (ref 80.0–100.0)

## 2014-05-09 LAB — GLUCOSE, CAPILLARY
GLUCOSE-CAPILLARY: 121 mg/dL — AB (ref 70–99)
GLUCOSE-CAPILLARY: 131 mg/dL — AB (ref 70–99)
Glucose-Capillary: 105 mg/dL — ABNORMAL HIGH (ref 70–99)
Glucose-Capillary: 151 mg/dL — ABNORMAL HIGH (ref 70–99)
Glucose-Capillary: 167 mg/dL — ABNORMAL HIGH (ref 70–99)
Glucose-Capillary: 186 mg/dL — ABNORMAL HIGH (ref 70–99)
Glucose-Capillary: 49 mg/dL — ABNORMAL LOW (ref 70–99)
Glucose-Capillary: 52 mg/dL — ABNORMAL LOW (ref 70–99)

## 2014-05-09 MED ORDER — FOLIC ACID 5 MG/ML IJ SOLN
1.0000 mg | Freq: Every day | INTRAMUSCULAR | Status: DC
  Administered 2014-05-09: 1 mg via INTRAVENOUS
  Filled 2014-05-09: qty 0.2

## 2014-05-09 MED ORDER — DEXTROSE 50 % IV SOLN
INTRAVENOUS | Status: AC
  Administered 2014-05-09: 50 mL
  Filled 2014-05-09: qty 50

## 2014-05-09 MED ORDER — VITAMIN B-1 100 MG PO TABS
100.0000 mg | ORAL_TABLET | Freq: Every day | ORAL | Status: DC
Start: 2014-05-09 — End: 2014-05-11
  Administered 2014-05-09 – 2014-05-11 (×3): 100 mg via ORAL
  Filled 2014-05-09 (×3): qty 1

## 2014-05-09 MED ORDER — DIPHENHYDRAMINE HCL 12.5 MG/5ML PO ELIX
25.0000 mg | ORAL_SOLUTION | Freq: Four times a day (QID) | ORAL | Status: DC
  Administered 2014-05-09: 25 mg via ORAL
  Filled 2014-05-09 (×3): qty 10

## 2014-05-09 MED ORDER — FAMOTIDINE 40 MG/5ML PO SUSR
20.0000 mg | Freq: Two times a day (BID) | ORAL | Status: DC
  Filled 2014-05-09: qty 2.5

## 2014-05-09 MED ORDER — THIAMINE HCL 100 MG/ML IJ SOLN
100.0000 mg | Freq: Every day | INTRAMUSCULAR | Status: DC
  Administered 2014-05-09: 100 mg via INTRAVENOUS
  Filled 2014-05-09: qty 1

## 2014-05-09 MED ORDER — LORAZEPAM 2 MG/ML IJ SOLN
2.0000 mg | INTRAMUSCULAR | Status: DC | PRN
  Administered 2014-05-09: 3 mg via INTRAVENOUS
  Filled 2014-05-09: qty 2

## 2014-05-09 MED ORDER — FLUOXETINE HCL 20 MG/5ML PO SOLN
40.0000 mg | Freq: Every day | ORAL | Status: DC
  Filled 2014-05-09: qty 10

## 2014-05-09 MED ORDER — FLUOXETINE HCL 20 MG PO CAPS
40.0000 mg | ORAL_CAPSULE | Freq: Every day | ORAL | Status: DC
  Administered 2014-05-10 – 2014-05-13 (×4): 40 mg via ORAL
  Filled 2014-05-09 (×4): qty 2

## 2014-05-09 MED ORDER — ATORVASTATIN CALCIUM 40 MG PO TABS
40.0000 mg | ORAL_TABLET | Freq: Every day | ORAL | Status: DC
  Administered 2014-05-10 – 2014-05-13 (×4): 40 mg via ORAL
  Filled 2014-05-09 (×4): qty 1

## 2014-05-09 MED ORDER — DEXTROSE-NACL 5-0.45 % IV SOLN
INTRAVENOUS | Status: DC
  Administered 2014-05-09: 12:00:00 via INTRAVENOUS

## 2014-05-09 MED ORDER — FAMOTIDINE 20 MG PO TABS
20.0000 mg | ORAL_TABLET | Freq: Two times a day (BID) | ORAL | Status: DC
  Administered 2014-05-10 – 2014-05-13 (×7): 20 mg via ORAL
  Filled 2014-05-09 (×10): qty 1

## 2014-05-09 MED ORDER — DIPHENHYDRAMINE HCL 25 MG PO CAPS
25.0000 mg | ORAL_CAPSULE | Freq: Four times a day (QID) | ORAL | Status: DC
  Administered 2014-05-10 – 2014-05-11 (×5): 25 mg via ORAL
  Filled 2014-05-09 (×8): qty 1

## 2014-05-09 MED ORDER — FOLIC ACID 1 MG PO TABS
1.0000 mg | ORAL_TABLET | Freq: Every day | ORAL | Status: DC
  Administered 2014-05-09 – 2014-05-11 (×3): 1 mg via ORAL
  Filled 2014-05-09 (×3): qty 1

## 2014-05-09 MED ORDER — LORAZEPAM 2 MG/ML IJ SOLN
2.0000 mg | INTRAMUSCULAR | Status: DC | PRN
  Administered 2014-05-10 (×2): 3 mg via INTRAVENOUS
  Administered 2014-05-11: 2 mg via INTRAVENOUS
  Filled 2014-05-09: qty 2
  Filled 2014-05-09: qty 1
  Filled 2014-05-09: qty 2

## 2014-05-09 NOTE — Procedures (Signed)
Extubation Procedure Note  Patient Details:   Name: Ariel Meyer DOB: 11/26/1950 MRN: [id number]   Airway Documentation:     Evaluation  O2 sats: stable throughout Complications: No apparent complications Patient did tolerate procedure well. Bilateral Breath Sounds:  (coarse/diminished) Suctioning: Airway Yes pt able to vocalize.  Pt extubated at this time per order. No complications. Pt tolerated well. VS stable. Pt was able to breathe around deflated cuff prior to extubation. Pt placed on 4L DISH and tolerating well at this time. RT will continue to monitor.  Loyal Jacobsonhompson, [facility] 05/09/2014, 8:53 AM

## 2014-05-09 NOTE — Progress Notes (Signed)
PULMONARY / CRITICAL CARE MEDICINE   Name: Ariel Meyer MRN: [id number] DOB: 03/11/1951    ADMISSION DATE:  05/06/2014 CONSULTATION DATE:  05/09/2014  REFERRING MD :  [facility]  CHIEF COMPLAINT:  Angioedema  INITIAL PRESENTATION:  63 [facility]. F [facility] and difficulty breathing.  She was admitted to SDU for close monitoring.  Later that night, symptoms worsened and pt developed stridor.  She was intubated for angioedema and [facility].  Reported difficult airway per anesthesia.   STUDIES:  CT Neck 12/24 >>> unilateral angioedema of left pharynx, mild airway narrowing.  SIGNIFICANT EVENTS: 12/24  presented to [facility] with cough, difficulty breathing, admitted to SDU for angioedema. 12/25  symptoms worsened, intubated by anesthesia, [facility] 12/26  Remains on precedex @ 1.2, no acute events 12/27  Intermittent agitation, sats drop with episodes, O2 increased to 60% with agitation.  Tolerated SBT later am with good cuff leak.  SUBJECTIVE:  RN reports pt with periods of intermittent agitation, pulling out IV's.  With agitation, sats drop into high 80's, O2 increased to 60%.  Tolerated SBT with good cuff leak later am.  5/5 pulling volumes [id number], 40%.  Good cuff leak, no distress.   VITAL SIGNS: Temp:  [98.2 F (36.8 C)-98.9 F (37.2 C)] 98.6 F (37 C) (12/27 0742) Pulse Rate:  [54-94] 56 (12/27 0735) Resp:  [11-24] 24 (12/27 0735) BP: (100-124)/(62-84) 119/73 mmHg (12/27 0500) SpO2:  [89 %-100 %] 89 % (12/27 0735) FiO2 (%):  [40 %-60 %] 60 % (12/27 0735)   VENTILATOR SETTINGS: Vent Mode:  [-] PRVC FiO2 (%):  [40 %-60 %] 60 % Set Rate:  [14 bmp-24 bmp] 24 bmp Vt Set:  [500 mL-550 mL] 550 mL PEEP:  [5 cmH20] 5 cmH20 Plateau Pressure:  [16 cmH20-21 cmH20] 21 cmH20   INTAKE / OUTPUT: Intake/Output      12/26 0701 - 12/27 0700 12/27 0701 - 12/28 0700   I.V. (mL/kg) 1918.8 (18.5)    NG/GT 270    IV Piggyback     Total  Intake(mL/kg) 2188.8 (21.1)    Urine (mL/kg/hr) 1500 (0.6)    Total Output 1500     Net +688.8            PHYSICAL EXAMINATION: General: Obese female, in NAD. Neuro: Sedated on vent, startles to verbal stimuli, periods of significant agitation, MAE spontaneously   HEENT: [place]/AT. PERRL, sclerae anicteric, tongue bruising / swelling improved  Cardiovascular: RRR, no M/R/G.  Lungs: Respirations even and unlabored, prolonged exp phase with soft wheeze  Abdomen: BS x 4, soft, NT/ND.  Musculoskeletal: No gross deformities, no edema.  Skin: Intact, warm, no rashes.  LABS:  CBC  Recent Labs Lab 05/07/14 0525 05/08/14 0235 05/09/14 0400  WBC 12.2* 14.2* 10.7*  HGB 11.8* 11.1* 11.0*  HCT 35.8* 34.3* 33.9*  PLT 221 200 158   BMET  Recent Labs Lab 05/07/14 0525 05/08/14 0235 05/09/14 0400  NA 138 137 137  K 4.7 4.8 5.0  CL 108 108 109  CO2 18* 21 19  BUN 41* 49* 49*  CREATININE 2.19* 1.91* 1.38*  GLUCOSE 233* 76 141*   Electrolytes  Recent Labs Lab 05/07/14 0525 05/08/14 0235 05/09/14 0400  CALCIUM 8.3* 7.9* 8.3*   Liver Enzymes  Recent Labs Lab 05/07/14 0525  AST 33  ALT 33  ALKPHOS 78  BILITOT 0.8  ALBUMIN 3.4*   Glucose  Recent Labs Lab 05/08/14 1113 05/08/14  1527 05/08/14 1951 05/09/14 0029 05/09/14 0328 05/09/14 0705  GLUCAP 86 150* 163* 186* 151* 121*    Imaging Dg Chest Port 1 View  05/08/2014   CLINICAL DATA:  Acute respiratory failure and hypoxia.  EXAM: PORTABLE CHEST - 1 VIEW  COMPARISON:  Yesterday  FINDINGS: Stable endotracheal tube. NG tube beyond the gastroesophageal junction. Mild cardiomegaly. Prominent upper mediastinum. Low volumes and bibasilar atelectasis.  IMPRESSION: Stable bibasilar atelectasis.   Electronically Signed   By: Maryclare BeanArt  Hoss M.D.   On: 05/08/2014 08:29     ASSESSMENT / PLAN:  PULMONARY ETT 12/25 >>> A: Acute respiratory failure in setting angioedema DIFFICULT INTUBATION PER ANESTHESIA P:   Meets  extubation criteria:  Good cuff leak, volumes as above, on 40% 5/5 Continue solumedrol, pepcid, benadryl for now CXR in AM. Hold outpatient flonase.  CARDIOVASCULAR A:  Hx HTN, HLD P:  Lisinopril d/c'd and added to allergy list. NO FURTHER LISINOPRIL. Hydralazine, Lopressor PRN. Hold outpatient atorvastatin, HCTZ.  RENAL A:   AKI P:   IV fluids:  1/2 NS @ 50 ml/hr. Trend BMP   GASTROINTESTINAL A:   GERD Nutrition P:   Pepcid NPO. TF's on hold Keep NGT until more awake, then can d/c   HEMATOLOGIC A:   Mild Anemia - no overt bleeding VTE Prophylaxis P:  SCD's / Lovenox. CBC in AM.  INFECTIOUS A:   No indication for infection P:   Monitor clinically.  ENDOCRINE A:   DM  P:   CBG's q4hr. SSI. Hold outpatient metformin in setting of AKI.  NEUROLOGIC A:   Acute metabolic encephalopathy Hx ETOH Abuse  P:   RASS goal: 0  ETOH abuse may complicate weaning / ICU stay, monitor closely for withdrawal CIWA protocol with lorazepam  Mobilize as able  Thiamine / Folate  Family updated:  Family updated at bedside am 12/27  Interdisciplinary Family Meeting v Palliative Care Meeting:  Due by: 01/02  Canary BrimBrandi Ollis, NP-C [place] [facility] Pgr: [phone] or [phone]  Reviewed above, examined pt.  Tongue swelling improved.  Tolerated SBT and extubated.  Denies chest pain or dyspnea.  Has globus sensation.  Blood pressure on low side.    Will monitor in ICU after extubation.  Continue benadryl, solumedrol, pepcid for now.  Advance diet as tolerated.  CC time by me independent of APP time 35 minutes.  Coralyn HellingVineet Izetta Sakamoto, MD [facility] 05/09/2014, 12:39 PM Pager:  [phone] After 3pm call: [phone]

## 2014-05-09 NOTE — Progress Notes (Signed)
eLink Physician-Brief Progress Note Patient Name: Paulo FruitMalona Bass DOB: 03/17/1951 MRN: [id number]   Date of Service  05/09/2014  HPI/Events of Note  Needs meds changed from tube to po, taking them well   eICU Interventions  Changed all meds to po     Intervention Category Minor Interventions: Routine modifications to care plan (e.g. PRN medications for pain, fever)  Sandrea HughsMichael Geran Haithcock 05/09/2014, 10:11 PM

## 2014-05-10 ENCOUNTER — Inpatient Hospital Stay (HOSPITAL_COMMUNITY): Payer: BC Managed Care – PPO

## 2014-05-10 DIAGNOSIS — G9340 Encephalopathy, unspecified: Secondary | ICD-10-CM

## 2014-05-10 DIAGNOSIS — T783XXD Angioneurotic edema, subsequent encounter: Secondary | ICD-10-CM

## 2014-05-10 DIAGNOSIS — F10931 Alcohol use, unspecified with withdrawal delirium: Secondary | ICD-10-CM

## 2014-05-10 DIAGNOSIS — T884XXA Failed or difficult intubation, initial encounter: Secondary | ICD-10-CM | POA: Insufficient documentation

## 2014-05-10 DIAGNOSIS — F10231 Alcohol dependence with withdrawal delirium: Secondary | ICD-10-CM

## 2014-05-10 LAB — CBC
HCT: 34.3 % — ABNORMAL LOW (ref 36.0–46.0)
Hemoglobin: 11.1 g/dL — ABNORMAL LOW (ref 12.0–15.0)
MCH: 32.2 pg (ref 26.0–34.0)
MCHC: 32.4 g/dL (ref 30.0–36.0)
MCV: 99.4 fL (ref 78.0–100.0)
PLATELETS: 167 10*3/uL (ref 150–400)
RBC: 3.45 MIL/uL — ABNORMAL LOW (ref 3.87–5.11)
RDW: 13.5 % (ref 11.5–15.5)
WBC: 10.8 10*3/uL — ABNORMAL HIGH (ref 4.0–10.5)

## 2014-05-10 LAB — BASIC METABOLIC PANEL
Anion gap: 6 (ref 5–15)
BUN: 38 mg/dL — ABNORMAL HIGH (ref 6–23)
CALCIUM: 8.7 mg/dL (ref 8.4–10.5)
CO2: 26 mmol/L (ref 19–32)
CREATININE: 1.09 mg/dL (ref 0.50–1.10)
Chloride: 106 mEq/L (ref 96–112)
GFR calc non Af Amer: 52 mL/min — ABNORMAL LOW (ref >90)
GFR, EST AFRICAN AMERICAN: 61 mL/min — AB (ref >90)
Glucose, Bld: 115 mg/dL — ABNORMAL HIGH (ref 70–99)
Potassium: 4.7 mmol/L (ref 3.5–5.1)
Sodium: 138 mmol/L (ref 135–145)

## 2014-05-10 LAB — GLUCOSE, CAPILLARY
GLUCOSE-CAPILLARY: 110 mg/dL — AB (ref 70–99)
GLUCOSE-CAPILLARY: 139 mg/dL — AB (ref 70–99)
GLUCOSE-CAPILLARY: 106 mg/dL — AB (ref 70–99)
Glucose-Capillary: 110 mg/dL — ABNORMAL HIGH (ref 70–99)
Glucose-Capillary: 120 mg/dL — ABNORMAL HIGH (ref 70–99)
Glucose-Capillary: 93 mg/dL (ref 70–99)

## 2014-05-10 MED ORDER — DEXMEDETOMIDINE HCL IN NACL 200 MCG/50ML IV SOLN: 0.4000 ug/kg/h | INTRAVENOUS | Status: DC

## 2014-05-10 MED ORDER — ENOXAPARIN SODIUM 60 MG/0.6ML ~~LOC~~ SOLN
50.0000 mg | SUBCUTANEOUS | Status: DC
  Administered 2014-05-10 – 2014-05-12 (×3): 50 mg via SUBCUTANEOUS
  Filled 2014-05-10 (×5): qty 0.6

## 2014-05-10 NOTE — Progress Notes (Signed)
PULMONARY / CRITICAL CARE MEDICINE   Name: Ariel Meyer MRN: [id number] DOB: 11/19/1950    ADMISSION DATE:  05/06/2014 CONSULTATION DATE:  05/10/2014  REFERRING MD :  [facility]  CHIEF COMPLAINT:  Angioedema  INITIAL PRESENTATION:  63 [facility] and [facility].  She was admitted to SDU for close monitoring.  Later that night, symptoms worsened and pt developed stridor.  She was intubated for angioedema and [facility].  Reported difficult airway per anesthesia.   STUDIES:  CT Neck 12/24 >>> unilateral angioedema of left pharynx, mild airway narrowing.  SIGNIFICANT EVENTS: 12/24  presented to [facility] with cough, difficulty breathing, admitted to SDU for angioedema. 12/25  symptoms worsened, intubated by anesthesia, [facility] 12/26  Remains on precedex @ 1.2, no acute events 12/27  Intermittent agitation, sats drop with episodes, O2 increased to 60% with agitation.  Tolerated SBT later am with good cuff leak.  -   RN reports pt with periods of intermittent agitation, pulling out IV's.  With agitation, sats drop into high 80's, O2 increased to 60%.  Tolerated SBT with good cuff leak later am.  5/5 pulling volumes [phone], 40%.  Good cuff leak, no distress.    SUBJECTIVE/OVERNIGHT/INTERVAL HX 05/10/2014: Extubated yesterday. RN describes agitation while on and off vent. On vent was on precedex. Post extubation agitated, hallucinating and overnight got ativan 9mg .  Now drowsy " extremely" per RN   VITAL SIGNS: Temp:  [97.5 F (36.4 C)-98.4 F (36.9 C)] 97.5 F (36.4 C) (12/28 0735) Pulse Rate:  [62-83] 82 (12/27 2200) Resp:  [9-27] 9 (12/28 0500) BP: (82-139)/(50-86) 112/63 mmHg (12/28 0500) SpO2:  [93 %-98 %] 95 % (12/27 2200)   VENTILATOR SETTINGS:     INTAKE / OUTPUT: Intake/Output      12/27 0701 - 12/28 0700 12/28 0701 - 12/29 0700   P.O. 240    I.V. (mL/kg) 1005 (9.7)    NG/GT     Total  Intake(mL/kg) 1245 (12)    Urine (mL/kg/hr) 2665 (1.1)    Total Output 2665     Net -1420            PHYSICAL EXAMINATION: General: Obese female, in NAD. Neuro:  Sitting in chair., RASS -2/-3. Maintaining airway HEENT: [place]/AT. PERRL, sclerae anicteric, tongue bruising / swelling improved  Cardiovascular: RRR, no M/R/G.  Lungs: Respirations even and unlabored, prolonged exp phase with soft wheeze  Abdomen: BS x 4, soft, NT/ND.  Musculoskeletal: No gross deformities, no edema.  Skin: Intact, warm, no rashes.  LABS: PULMONARY  Recent Labs Lab 05/09/14 0029 05/09/14 0206 05/09/14 0451  PHART 7.219* 7.244* 7.388  PCO2ART 49.9* 47.5* 34.3*  PO2ART 117.0* 67.0* 56.3*  HCO3 20.4 20.5 20.2  TCO2 22 22 21.3  O2SAT 98.0 89.0 89.7    CBC  Recent Labs Lab 05/08/14 0235 05/09/14 0400 05/10/14 0328  HGB 11.1* 11.0* 11.1*  HCT 34.3* 33.9* 34.3*  WBC 14.2* 10.7* 10.8*  PLT 200 158 167    COAGULATION No results for input(s): INR in the last 168 hours.  CARDIAC  No results for input(s): TROPONINI in the last 168 hours. No results for input(s): PROBNP in the last 168 hours.   CHEMISTRY  Recent Labs Lab 05/06/14 1629 05/07/14 0525 05/08/14 0235 05/09/14 0400 05/10/14 0328  NA 139 138 137 137 138  K 4.5 4.7 4.8 5.0 4.7  CL 106 108 108 109 106  CO2 23 18*  21 19 26   GLUCOSE 115* 233* 76 141* 115*  BUN 40* 41* 49* 49* 38*  CREATININE 2.30* 2.19* 1.91* 1.38* 1.09  CALCIUM 8.9 8.3* 7.9* 8.3* 8.7   Estimated Creatinine Clearance: 66.8 mL/min (by C-G formula based on Cr of 1.09).   LIVER  Recent Labs Lab 05/07/14 0525  AST 33  ALT 33  ALKPHOS 78  BILITOT 0.8  PROT 6.7  ALBUMIN 3.4*     INFECTIOUS No results for input(s): LATICACIDVEN, PROCALCITON in the last 168 hours.   ENDOCRINE CBG (last 3)   Recent Labs  05/10/14 0027 05/10/14 0427 05/10/14 0732  GLUCAP 93 110* 139*         IMAGING x48h Dg Chest Port 1 View  05/10/2014   CLINICAL  DATA:  Acute respiratory failure.  EXAM: PORTABLE CHEST - 1 VIEW  COMPARISON:  05/09/2014.  FINDINGS: Interim extubation and removal NG tube. Persistent left lower lobe atelectasis and/or infiltrate. Mild subsegmental atelectasis right lung base. Stable cardiomegaly. Pulmonary vascularity normal. Tiny left pleural effusion cannot be excluded. No pneumothorax. No acute bony abnormality.  IMPRESSION: 1. Interim extubation removal NG tube. 2. Persistent left lower lobe atelectasis and/or infiltrate with small left pleural effusion. 3. Subsegmental atelectasis right lung base.   Electronically Signed   By: Maisie Fushomas  Register   On: 05/10/2014 07:03   Dg Chest Port 1 View  05/09/2014   CLINICAL DATA:  Acute respiratory failure, history hypertension, diabetes, GERD  EXAM: PORTABLE CHEST - 1 VIEW  COMPARISON:  Portable exam 0537 hr compared to earlier exam of 05/09/2014 at 0045 hr  FINDINGS: Tip of endotracheal tube projects 4.6 cm above carina.  Nasogastric tube extends into stomach.  Upper normal heart size.  Normal mediastinal contours and pulmonary vascularity.  Atelectasis versus consolidation LEFT lower lobe, increased.  Remaining lungs clear.  No pleural effusion or pneumothorax.  IMPRESSION: Increased atelectasis versus consolidation in LEFT lower lobe.   Electronically Signed   By: Ulyses SouthwardMark  Boles M.D.   On: 05/09/2014 08:34   Dg Chest Port 1 View  05/09/2014   CLINICAL DATA:  Acute onset of respiratory abnormality. Subsequent encounter.  EXAM: PORTABLE CHEST - 1 VIEW  COMPARISON:  Chest radiograph performed 05/08/2014  FINDINGS: The patient's endotracheal tube is seen ending 3-4 cm above the carina. An enteric tube is noted extending below the diaphragm.  The lungs are well-aerated. Left basilar airspace opacity raises concern for mild pneumonia. Mild vascular congestion is noted. There is no evidence of pleural effusion or pneumothorax.  The cardiomediastinal silhouette is borderline enlarged. No acute osseous  abnormalities are seen.  IMPRESSION: 1. Endotracheal tube seen ending 3-4 cm above the carina. 2. Left basilar airspace opacity raises concern for mild pneumonia. 3. Mild vascular congestion and borderline cardiomegaly.   Electronically Signed   By: Roanna RaiderJeffery  Chang M.D.   On: 05/09/2014 04:54        ASSESSMENT / PLAN:  PULMONARY ETT 12/25 >>>05/09/14 A: Acute respiratory failure in setting angioedema DIFFICULT INTUBATION PER ANESTHESIA   - s/p exdtubation 05/09/14. Doing well without angioedema.  P:   Continue solumedrol, pepcid, benadryl for now CXR in AM. Hold outpatient flonase.  CARDIOVASCULAR A:  Hx HTN, HLD P:  Lisinopril d/c'd and added to allergy list. NO FURTHER LISINOPRIL. Hydralazine, Lopressor PRN. Hold outpatient atorvastatin, HCTZ.  RENAL A:   AKI P:   IV fluids:  D5-1/2 NS @50  ml/hr. Trend BMP   GASTROINTESTINAL A:   GERD Nutrition P:   Pepcid NPO -  due to drowsly state TF's on hold Keep NGT until more awake, then can d/c   HEMATOLOGIC A:   Mild Anemia - no overt bleeding VTE Prophylaxis P:  SCD's / Lovenox. CBC in AM.  INFECTIOUS A:   No indication for infection P:   Monitor clinically.  ENDOCRINE A:   DM  P:   CBG's q4hr. SSI. Hold outpatient metformin in setting of AKI.  NEUROLOGIC A:   Acute metabolic encephalopathy Hx ETOH Abuse    - 05/10/14 - post extubation and post ativan 9mg  RASS -2/-3  P:   RASS goal: 0 ; await improvement CIWA protocol with lorazepam prn If agitated, initiate Precedex gtt as first choice Mobilize as able  Thiamine / Folate  Family updated:  Family updated at bedside am 12/27 . Husband updated agauin12/28/15  Interdisciplinary Family Meeting v Palliative Care Meeting:  Due by: 01/02     The patient is critically ill with multiple organ systems failure and requires high complexity decision making for assessment and support, frequent evaluation and titration of therapies, application of  advanced monitoring technologies and extensive interpretation of multiple databases.   Critical Care Time devoted to patient care services described in this note is  35  Minutes. This time reflects time of care of this signee Dr Kalman Shan. This critical care time does not reflect procedure time, or teaching time or supervisory time of PA/NP/Med student/Med Resident etc but could involve care discussion time    Dr. Kalman Shan, M.D., [facility].C.P Pulmonary and Critical Care Medicine Staff Physician [facility] [place] [facility] Pager: [phone], If no answer or between  15:00h - 7:00h: call [phone]  05/10/2014 10:42 AM

## 2014-05-11 LAB — GLUCOSE, CAPILLARY
GLUCOSE-CAPILLARY: 128 mg/dL — AB (ref 70–99)
GLUCOSE-CAPILLARY: 130 mg/dL — AB (ref 70–99)
GLUCOSE-CAPILLARY: 95 mg/dL (ref 70–99)
Glucose-Capillary: 117 mg/dL — ABNORMAL HIGH (ref 70–99)
Glucose-Capillary: 124 mg/dL — ABNORMAL HIGH (ref 70–99)
Glucose-Capillary: 132 mg/dL — ABNORMAL HIGH (ref 70–99)
Glucose-Capillary: 137 mg/dL — ABNORMAL HIGH (ref 70–99)

## 2014-05-11 LAB — CBC WITH DIFFERENTIAL/PLATELET
BASOS ABS: 0 10*3/uL (ref 0.0–0.1)
Basophils Relative: 0 % (ref 0–1)
Eosinophils Absolute: 0 10*3/uL (ref 0.0–0.7)
Eosinophils Relative: 0 % (ref 0–5)
HCT: 31 % — ABNORMAL LOW (ref 36.0–46.0)
Hemoglobin: 10.1 g/dL — ABNORMAL LOW (ref 12.0–15.0)
LYMPHS ABS: 1 10*3/uL (ref 0.7–4.0)
Lymphocytes Relative: 10 % — ABNORMAL LOW (ref 12–46)
MCH: 32.8 pg (ref 26.0–34.0)
MCHC: 32.6 g/dL (ref 30.0–36.0)
MCV: 100.6 fL — AB (ref 78.0–100.0)
MONO ABS: 0.7 10*3/uL (ref 0.1–1.0)
MONOS PCT: 7 % (ref 3–12)
NEUTROS ABS: 7.9 10*3/uL — AB (ref 1.7–7.7)
Neutrophils Relative %: 83 % — ABNORMAL HIGH (ref 43–77)
Platelets: 180 10*3/uL (ref 150–400)
RBC: 3.08 MIL/uL — ABNORMAL LOW (ref 3.87–5.11)
RDW: 13.3 % (ref 11.5–15.5)
WBC: 9.6 10*3/uL (ref 4.0–10.5)

## 2014-05-11 LAB — BASIC METABOLIC PANEL
Anion gap: 7 (ref 5–15)
BUN: 30 mg/dL — ABNORMAL HIGH (ref 6–23)
CALCIUM: 8.3 mg/dL — AB (ref 8.4–10.5)
CO2: 25 mmol/L (ref 19–32)
CREATININE: 1 mg/dL (ref 0.50–1.10)
Chloride: 101 mEq/L (ref 96–112)
GFR calc Af Amer: 67 mL/min — ABNORMAL LOW (ref >90)
GFR calc non Af Amer: 58 mL/min — ABNORMAL LOW (ref >90)
GLUCOSE: 363 mg/dL — AB (ref 70–99)
Potassium: 4.7 mmol/L (ref 3.5–5.1)
SODIUM: 133 mmol/L — AB (ref 135–145)

## 2014-05-11 LAB — TROPONIN I

## 2014-05-11 LAB — LACTIC ACID, PLASMA: Lactic Acid, Venous: 1.1 mmol/L (ref 0.5–2.2)

## 2014-05-11 LAB — MAGNESIUM: Magnesium: 1.4 mg/dL — ABNORMAL LOW (ref 1.5–2.5)

## 2014-05-11 LAB — PHOSPHORUS: Phosphorus: 3.1 mg/dL (ref 2.3–4.6)

## 2014-05-11 MED ORDER — VITAMIN B-1 100 MG PO TABS
100.0000 mg | ORAL_TABLET | Freq: Every day | ORAL | Status: DC
  Administered 2014-05-11 – 2014-05-13 (×3): 100 mg via ORAL
  Filled 2014-05-11 (×3): qty 1

## 2014-05-11 MED ORDER — FOLIC ACID 1 MG PO TABS
1.0000 mg | ORAL_TABLET | Freq: Every day | ORAL | Status: DC
  Filled 2014-05-11: qty 1

## 2014-05-11 MED ORDER — PREDNISONE 20 MG PO TABS: 20.0000 mg | ORAL_TABLET | Freq: Every day | ORAL | Status: DC

## 2014-05-11 MED ORDER — ADULT MULTIVITAMIN W/MINERALS CH
1.0000 | ORAL_TABLET | Freq: Every day | ORAL | Status: DC
  Administered 2014-05-11 – 2014-05-13 (×3): 1 via ORAL
  Filled 2014-05-11 (×3): qty 1

## 2014-05-11 MED ORDER — PREDNISONE 5 MG PO TABS
5.0000 mg | ORAL_TABLET | Freq: Every day | ORAL | Status: DC
Start: 2014-05-21 — End: 2014-05-13

## 2014-05-11 MED ORDER — PREDNISONE 10 MG PO TABS: 10.0000 mg | ORAL_TABLET | Freq: Every day | ORAL | Status: DC

## 2014-05-11 MED ORDER — LORAZEPAM 2 MG/ML IJ SOLN: 0.0000 mg | Freq: Two times a day (BID) | INTRAMUSCULAR | Status: DC

## 2014-05-11 MED ORDER — TRAMADOL HCL 50 MG PO TABS
50.0000 mg | ORAL_TABLET | Freq: Once | ORAL | Status: AC
  Administered 2014-05-11: 50 mg via ORAL
  Filled 2014-05-11: qty 1

## 2014-05-11 MED ORDER — THIAMINE HCL 100 MG/ML IJ SOLN
100.0000 mg | Freq: Every day | INTRAMUSCULAR | Status: DC
  Filled 2014-05-11 (×3): qty 1

## 2014-05-11 MED ORDER — LORAZEPAM 2 MG/ML IJ SOLN: 1.0000 mg | Freq: Four times a day (QID) | INTRAMUSCULAR | Status: DC | PRN

## 2014-05-11 MED ORDER — MAGNESIUM SULFATE 4 GM/100ML IV SOLN
4.0000 g | Freq: Once | INTRAVENOUS | Status: AC
  Administered 2014-05-11: 4 g via INTRAVENOUS
  Filled 2014-05-11: qty 100

## 2014-05-11 MED ORDER — FOLIC ACID 1 MG PO TABS
1.0000 mg | ORAL_TABLET | Freq: Every day | ORAL | Status: DC
  Administered 2014-05-12 – 2014-05-13 (×2): 1 mg via ORAL
  Filled 2014-05-11 (×2): qty 1

## 2014-05-11 MED ORDER — LORAZEPAM 1 MG PO TABS: 1.0000 mg | ORAL_TABLET | Freq: Four times a day (QID) | ORAL | Status: DC | PRN

## 2014-05-11 MED ORDER — PREDNISONE 20 MG PO TABS
40.0000 mg | ORAL_TABLET | Freq: Every day | ORAL | Status: DC
  Administered 2014-05-12 – 2014-05-13 (×2): 40 mg via ORAL
  Filled 2014-05-11 (×4): qty 2

## 2014-05-11 MED ORDER — LORAZEPAM 2 MG/ML IJ SOLN
0.0000 mg | Freq: Four times a day (QID) | INTRAMUSCULAR | Status: AC
  Administered 2014-05-13: 1 mg via INTRAVENOUS
  Filled 2014-05-11: qty 1

## 2014-05-11 MED ORDER — PREDNISONE 20 MG PO TABS
40.0000 mg | ORAL_TABLET | Freq: Every day | ORAL | Status: DC
  Filled 2014-05-11: qty 2

## 2014-05-11 NOTE — Progress Notes (Signed)
[facility] pt transferred to room 2 by NT via wheel chair

## 2014-05-11 NOTE — Progress Notes (Signed)
NUTRITION FOLLOW UP  Intervention:    Diet advancement as able per MD. Recommend CHO-modified diet.  Nutrition Dx:   Inadequate oral intake related to inability to eat as evidenced by NPO status, ongoing.  New Goal:   Intake to meet >90% of estimated nutrition needs.  Monitor:   Diet advancement, PO intake, labs, weight trend.  Assessment:   63 [facility]. [facility] and difficulty [facility]. She was admitted to SDU for close monitoring. Later that night, symptoms worsened and pt developed stridor. She was intubated for angioedema and [facility].  Patient was extubated on 12/27. Remains NPO. RN reports that patient is alert and hungry this morning. Awaiting MD to advance diet.   Height: Ht Readings from Last 1 Encounters:  05/06/14 5\' 9"  (1.753 m)    Weight Status:   Wt Readings from Last 1 Encounters:  05/08/14 228 lb 9.9 oz (103.7 kg)    Re-estimated needs:  Kcal: 1800-2000 Protein: 90-105 gm Fluid: 2 L  Skin: no issues  Diet Order: Diet NPO time specified Except for: Sips with Meds   Intake/Output Summary (Last 24 hours) at 05/11/14 0944 Last data filed at 05/11/14 0600  Gross per 24 hour  Intake   1300 ml  Output   1155 ml  Net    145 ml    Last BM: 12/23   Labs:   Recent Labs Lab 05/09/14 0400 05/10/14 0328 05/11/14 0256  NA 137 138 133*  K 5.0 4.7 4.7  CL 109 106 101  CO2 19 26 25   BUN 49* 38* 30*  CREATININE 1.38* 1.09 1.00  CALCIUM 8.3* 8.7 8.3*  MG  --   --  1.4*  PHOS  --   --  3.1  GLUCOSE 141* 115* 363*    CBG (last 3)   Recent Labs  05/10/14 2341 05/11/14 0438 05/11/14 0727  GLUCAP 124* 132* 128*    Scheduled Meds: . atorvastatin  40 mg Oral Daily  . cholecalciferol  1,000 Units Oral Daily  . diphenhydrAMINE  25 mg Oral QID  . enoxaparin (LOVENOX) injection  50 mg Subcutaneous Q24H  . famotidine  20 mg Oral BID  . FLUoxetine  40 mg Oral Daily  . folic acid  1 mg Oral  Daily  . insulin aspart  0-15 Units Subcutaneous 6 times per day  . methylPREDNISolone (SOLU-MEDROL) injection  40 mg Intravenous Q12H  . sodium chloride  3 mL Intravenous Q12H  . thiamine  100 mg Oral Daily    Continuous Infusions: . sodium chloride 10 mL/hr at 05/08/14 2115  . dexmedetomidine    . dextrose 5 % and 0.45% NaCl 50 mL/hr at 05/09/14 1130    Joaquin CourtsKimberly Luvenia Cranford, RD, LDN, CNSC Pager [phone] After Hours Pager [phone]

## 2014-05-11 NOTE — Progress Notes (Signed)
PULMONARY / CRITICAL CARE MEDICINE   Name: Ariel Meyer MRN: [id number] DOB: Mar 23, 1951    ADMISSION DATE:  05/06/2014 CONSULTATION DATE:  05/11/2014  REFERRING MD :  [facility]  CHIEF COMPLAINT:  Angioedema  INITIAL PRESENTATION:  63 y.o. F taken to [facility] 12/24 for cough and difficulty breathing.  She was admitted to SDU for close monitoring.  Later that night, symptoms worsened and pt developed stridor.  She was intubated for angioedema and transferred to [facility] ICU for further management.  Reported difficult airway per anesthesia.   STUDIES:  CT Neck 12/24 >>> unilateral angioedema of left pharynx, mild airway narrowing.  SIGNIFICANT EVENTS: 12/24  presented to [facility] with cough, difficulty breathing, admitted to SDU for angioedema. 12/25  symptoms worsened, intubated by anesthesia, transferred to [facility] ICU. 12/26  Remains on precedex @ 1.2, no acute events 12/27  Intermittent agitation, sats drop with episodes, O2 increased to 60% with agitation.  Tolerated SBT later am with good cuff leak.  -   RN reports pt with periods of intermittent agitation, pulling out IV's.  With agitation, sats drop into high 80's, O2 increased to 60%.  Tolerated SBT with good cuff leak later am.  5/5 pulling volumes [phone], 40%.  Good cuff leak, no distress.   05/10/2014: Extubated yesterday. RN describes agitation while on and off vent. On vent was on precedex. Post extubation agitated, hallucinating and overnight got ativan 9mg .  Now drowsy " extremely" per RN   SUBJECTIVE/OVERNIGHT/INTERVAL HX 05/11/14 - More awake. Got only 1 ativan in past 24h. Able to eat. Oriented most of the time. RN thinks meets floor criteria to go home  VITAL SIGNS: Temp:  [97.5 F (36.4 C)-98.4 F (36.9 C)] 98.2 F (36.8 C) (12/29 1137) Pulse Rate:  [56-104] 96 (12/29 1400) Resp:  [10-25] 19 (12/29 1400) BP: (113-161)/(66-103) 161/103 mmHg (12/29 1400) SpO2:  [91 %-99 %] 93 % (12/29 1400)   VENTILATOR SETTINGS:     INTAKE /  OUTPUT: Intake/Output      12/28 0701 - 12/29 0700 12/29 0701 - 12/30 0700   P.O. 420 100   I.V. (mL/kg) 1100 (10.6) 200 (1.9)   Total Intake(mL/kg) 1520 (14.7) 300 (2.9)   Urine (mL/kg/hr) 1355 (0.5) 150 (0.2)   Total Output 1355 150   Net +165 +150          PHYSICAL EXAMINATION: General: Obese female, in NAD. Neuro:  Sitting in bed. Maintaing airways.Oriented x 2.   HEENT: [place]/AT. PERRL, sclerae anicteric, tongue bruising / swelling improved  Voice Hoarse  Cardiovascular: RRR, no M/R/G.  Lungs: Respirations even and unlabored, prolonged exp phase with NO wheeze  Abdomen: BS x 4, soft, NT/ND.  Musculoskeletal: No gross deformities, no edema.  Skin: Intact, warm, no rashes.  LABS: PULMONARY  Recent Labs Lab 05/09/14 0029 05/09/14 0206 05/09/14 0451  PHART 7.219* 7.244* 7.388  PCO2ART 49.9* 47.5* 34.3*  PO2ART 117.0* 67.0* 56.3*  HCO3 20.4 20.5 20.2  TCO2 22 22 21.3  O2SAT 98.0 89.0 89.7    CBC  Recent Labs Lab 05/09/14 0400 05/10/14 0328 05/11/14 0256  HGB 11.0* 11.1* 10.1*  HCT 33.9* 34.3* 31.0*  WBC 10.7* 10.8* 9.6  PLT 158 167 180    COAGULATION No results for input(s): INR in the last 168 hours.  CARDIAC    Recent Labs Lab 05/11/14 0256  TROPONINI <0.03   No results for input(s): PROBNP in the last 168 hours.   CHEMISTRY  Recent Labs Lab 05/07/14 0525 05/08/14 0235 05/09/14  0400 05/10/14 0328 05/11/14 0256  NA 138 137 137 138 133*  K 4.7 4.8 5.0 4.7 4.7  CL 108 108 109 106 101  CO2 18* 21 19 26 25   GLUCOSE 233* 76 141* 115* 363*  BUN 41* 49* 49* 38* 30*  CREATININE 2.19* 1.91* 1.38* 1.09 1.00  CALCIUM 8.3* 7.9* 8.3* 8.7 8.3*  MG  --   --   --   --  1.4*  PHOS  --   --   --   --  3.1   Estimated Creatinine Clearance: 72.9 mL/min (by C-G formula based on Cr of 1).   LIVER  Recent Labs Lab 05/07/14 0525  AST 33  ALT 33  ALKPHOS 78  BILITOT 0.8  PROT 6.7  ALBUMIN 3.4*     INFECTIOUS  Recent Labs Lab 05/11/14 0257   LATICACIDVEN 1.1     ENDOCRINE CBG (last 3)   Recent Labs  05/10/14 2341 05/11/14 0438 05/11/14 0727  GLUCAP 124* 132* 128*         IMAGING x48h Dg Chest Port 1 View  05/10/2014   CLINICAL DATA:  Acute respiratory failure.  EXAM: PORTABLE CHEST - 1 VIEW  COMPARISON:  05/09/2014.  FINDINGS: Interim extubation and removal NG tube. Persistent left lower lobe atelectasis and/or infiltrate. Mild subsegmental atelectasis right lung base. Stable cardiomegaly. Pulmonary vascularity normal. Tiny left pleural effusion cannot be excluded. No pneumothorax. No acute bony abnormality.  IMPRESSION: 1. Interim extubation removal NG tube. 2. Persistent left lower lobe atelectasis and/or infiltrate with small left pleural effusion. 3. Subsegmental atelectasis right lung base.   Electronically Signed   By: Maisie Fushomas  Register   On: 05/10/2014 07:03        ASSESSMENT / PLAN:  PULMONARY ETT 12/25 >>>05/09/14 A: Acute respiratory failure in setting angioedema DIFFICULT INTUBATION PER ANESTHESIA   - s/p extubation 05/09/14. Doing well without angioedema.  Voice hoarse + P:   Change solumedrol to prednisone and recommend taper over 12 day taper DC benadryl Continue l, pepcid, benadryl for now Restart outpatient flonase.  CARDIOVASCULAR A:  Hx HTN, HLD P:  Lisinopril d/c'd and added to allergy list. NO FURTHER LISINOPRIL. Hydralazine, Lopressor PRN. Hold outpatient  HCTZ. - TRH to decice on restart  RENAL A:   AKI - resolved Low MAg 1.4 P:   Mag sulfate 4gm bolus. Trend BMP   GASTROINTESTINAL A:   GERD Nutrition P:   Pepcid Start clears - but hold off on solids till more awake   HEMATOLOGIC A:   Mild Anemia - no overt bleeding VTE Prophylaxis P:  SCD's / Lovenox. CBC in AM.  INFECTIOUS A:   No indication for infection P:   Monitor clinically.  ENDOCRINE A:   DM  P:   CBG's q4hr. SSI. Hold outpatient metformin in setting of AKI.  NEUROLOGIC A:    Acute metabolic encephalopathy Hx ETOH Abuse    - 05/11/14 - s/p extubation 12/27. Improving but not resolved mental status  P:   RASS goal: 0 ; await improvement CIWA protocol with lorazepam prn - floor protocol Mobilize as able  Thiamine / Folate  Family updated:  Family updated at bedside am 12/27 . Husband updated agauin12/28/15. Patient updated 05/11/14; not sure she understood it all  Interdisciplinary Family Meeting v Palliative Care Meeting:  Due by: 01/02 if still in ICU   [facility]   Dr. Kalman ShanMurali Loryn Haacke, M.D., [facility].C.C.P Pulmonary and Critical Care Medicine  Staff Physician [facility] [place] Pulmonary and Critical Care Pager: [phone], If no answer or between  15:00h - 7:00h: call [phone]  05/11/2014 2:49 PM

## 2014-05-11 NOTE — Progress Notes (Signed)
Inpatient Diabetes Program Recommendations  AACE/ADA: New Consensus Statement on Inpatient Glycemic Control (2013)  Target Ranges:  Prepandial:   less than 140 mg/dL      Peak postprandial:   less than 180 mg/dL (1-2 hours)      Critically ill patients:  140 - 180 mg/dL   Results for Ariel Meyer, Ariel Meyer (MRN [id number]) as of 05/11/2014 08:49  Ref. Range 05/10/2014 16:21 05/10/2014 19:16 05/10/2014 23:41 05/11/2014 04:38 05/11/2014 07:27  Glucose-Capillary Latest Range: 70-99 mg/dL 409106 (H) 811120 (H) 914124 (H) 132 (H) 128 (H)   Reason for Visit: Type 2  Diabetes history: Type 2 diabetes Outpatient Diabetes medications: Metformin 500mg  q hs Current orders for Inpatient glycemic control: Novolog 0-15 units q4h  If patient is transitioning to carb modified diet, please consider changing Novolog moderate correction to tid and hs. Please consider checking an A1C to determine blood sugar control at home- will watch and reassess needs based on the results.   [name], RN, BA, MHA, CDE Diabetes Coordinator Inpatient Diabetes Program  [phone] (Team Pager) [phone] [name]([facility]) 05/11/2014 8:59 AM

## 2014-05-12 LAB — CBC WITH DIFFERENTIAL/PLATELET
BASOS ABS: 0 10*3/uL (ref 0.0–0.1)
Basophils Relative: 0 % (ref 0–1)
EOS ABS: 0.2 10*3/uL (ref 0.0–0.7)
Eosinophils Relative: 2 % (ref 0–5)
HCT: 33.2 % — ABNORMAL LOW (ref 36.0–46.0)
HEMOGLOBIN: 11.1 g/dL — AB (ref 12.0–15.0)
Lymphocytes Relative: 33 % (ref 12–46)
Lymphs Abs: 3.6 10*3/uL (ref 0.7–4.0)
MCH: 32.3 pg (ref 26.0–34.0)
MCHC: 33.4 g/dL (ref 30.0–36.0)
MCV: 96.5 fL (ref 78.0–100.0)
MONOS PCT: 12 % (ref 3–12)
Monocytes Absolute: 1.3 10*3/uL — ABNORMAL HIGH (ref 0.1–1.0)
NEUTROS ABS: 6 10*3/uL (ref 1.7–7.7)
Neutrophils Relative %: 53 % (ref 43–77)
PLATELETS: 224 10*3/uL (ref 150–400)
RBC: 3.44 MIL/uL — ABNORMAL LOW (ref 3.87–5.11)
RDW: 13.1 % (ref 11.5–15.5)
WBC: 11.2 10*3/uL — ABNORMAL HIGH (ref 4.0–10.5)

## 2014-05-12 LAB — BASIC METABOLIC PANEL
ANION GAP: 5 (ref 5–15)
BUN: 23 mg/dL (ref 6–23)
CHLORIDE: 102 meq/L (ref 96–112)
CO2: 28 mmol/L (ref 19–32)
Calcium: 8.4 mg/dL (ref 8.4–10.5)
Creatinine, Ser: 1.01 mg/dL (ref 0.50–1.10)
GFR calc Af Amer: 67 mL/min — ABNORMAL LOW (ref >90)
GFR, EST NON AFRICAN AMERICAN: 58 mL/min — AB (ref >90)
Glucose, Bld: 92 mg/dL (ref 70–99)
POTASSIUM: 3.8 mmol/L (ref 3.5–5.1)
Sodium: 135 mmol/L (ref 135–145)

## 2014-05-12 LAB — MAGNESIUM: MAGNESIUM: 2.2 mg/dL (ref 1.5–2.5)

## 2014-05-12 LAB — GLUCOSE, CAPILLARY
GLUCOSE-CAPILLARY: 141 mg/dL — AB (ref 70–99)
GLUCOSE-CAPILLARY: 148 mg/dL — AB (ref 70–99)
GLUCOSE-CAPILLARY: 125 mg/dL — AB (ref 70–99)
Glucose-Capillary: 98 mg/dL (ref 70–99)
Glucose-Capillary: 84 mg/dL (ref 70–99)
Glucose-Capillary: 106 mg/dL — ABNORMAL HIGH (ref 70–99)

## 2014-05-12 LAB — PHOSPHORUS: PHOSPHORUS: 2.9 mg/dL (ref 2.3–4.6)

## 2014-05-12 MED ORDER — HYDROCHLOROTHIAZIDE 25 MG PO TABS
25.0000 mg | ORAL_TABLET | Freq: Every day | ORAL | Status: DC
  Administered 2014-05-13: 25 mg via ORAL
  Filled 2014-05-12 (×2): qty 1

## 2014-05-12 NOTE — Progress Notes (Signed)
PATIENT DETAILS Name: Ariel Meyer Age: 63 y.o. Sex: female Date of Birth: 1951-03-15 Admit Date: 05/06/2014 Admitting Physician Merwyn Katos, MD QMV:[id number],MWUXLK, MD  Subjective: Doing well, no major complaints.  Assessment/Plan: Active Problems:   Acute hypoxic respiratory failure: Secondary to angioedema. Intubated on 12/25-difficult intubation-extubated on 12/27. Doing well without any major issues currently.    Angioedema: Presumed secondary to lisinopril.CT Neck 12/24 showed unilateral angioedema of left pharynx, mild airway narrowing. Unfortunately deteriorated on admission and subsequently required intubation. Currently maintained on prednisone, will taper gradually. No stridor, lungs clear, seems to have improved.    Acute encephalopathy: Multifactorial-suspect secondary to ICU delirium, medications, and possible EtOH withdrawal. Currently awake, alert, mental status is back to baseline.    Hypertension: Resume HCTZ as blood pressure creeping up. No further lisinopril or Ace inhibitors-I have educated the patient regarding informing her doctors in the future that she is allergic to ACEI!!    Acute renal failure: Likely prerenal, resolved with supportive care.    DM (diabetes mellitus), type 2 with renal complications: CBGs stable, continue with SSI. Resume metformin on discharge.    Alcohol abuse: Drinks vodka every evening-counseled. On Ativan per CIWA protocol. No signs of withdrawal currently.    GERD: Resume PPI on discharge.    Dyslipidemia: Continue with Lipitor.  Disposition: Remain inpatient-suspect home in a.m.  Antibiotics:  None   Anti-infectives    None      DVT Prophylaxis: Prophylactic Lovenox   Code Status: Full code   Family Communication None at bedside-patient awake and alert, understands plan of care.  Procedures:  None  CONSULTS:  pulmonary/intensive care  Time spent 40 minutes-which includes 50% of the time  with face-to-face with patient/ family and coordinating care related to the above assessment and plan.  MEDICATIONS: Scheduled Meds: . atorvastatin  40 mg Oral Daily  . cholecalciferol  1,000 Units Oral Daily  . enoxaparin (LOVENOX) injection  50 mg Subcutaneous Q24H  . famotidine  20 mg Oral BID  . FLUoxetine  40 mg Oral Daily  . folic acid  1 mg Oral Daily  . hydrochlorothiazide  25 mg Oral Daily  . insulin aspart  0-15 Units Subcutaneous 6 times per day  . LORazepam  0-4 mg Intravenous Q6H   Followed by  . [START ON 05/13/2014] LORazepam  0-4 mg Intravenous Q12H  . multivitamin with minerals  1 tablet Oral Daily  . predniSONE  40 mg Oral Q breakfast   Followed by  . [START ON 05/15/2014] predniSONE  20 mg Oral Q breakfast   Followed by  . [START ON 05/18/2014] predniSONE  10 mg Oral Q breakfast   Followed by  . [START ON 05/21/2014] predniSONE  5 mg Oral Q breakfast  . sodium chloride  3 mL Intravenous Q12H  . thiamine  100 mg Oral Daily   Or  . thiamine  100 mg Intravenous Daily   Continuous Infusions:  PRN Meds:.hydrALAZINE, LORazepam **OR** LORazepam, metoprolol    PHYSICAL EXAM: Vital signs in last 24 hours: Filed Vitals:   05/12/14 0144 05/12/14 0513 05/12/14 1014 05/12/14 1330  BP: 142/88 135/82 156/100 145/98  Pulse: 83 64 64 79  Temp: 98.2 F (36.8 C) 98.7 F (37.1 C) 98.4 F (36.9 C) 98.2 F (36.8 C)  TempSrc: Oral Oral Oral Oral  Resp: 16 20 18 18   Height:      Weight:      SpO2: 97% 92% 99% 98%  Weight change:  Filed Weights   05/06/14 2025 05/07/14 0400 05/08/14 0600  Weight: 99.8 kg (220 lb 0.3 oz) 100.1 kg (220 lb 10.9 oz) 103.7 kg (228 lb 9.9 oz)   Body mass index is 33.75 kg/(m^2).   Gen Exam: Awake and alert with clear speech.   Neck: Supple, No JVD.   Chest: B/L Clear.   CVS: S1 S2 Regular, no murmurs.  Abdomen: soft, BS +, non tender, non distended.  Extremities: no edema, lower extremities warm to touch. Neurologic: Non Focal.     Skin: No Rash.   Wounds: N/A.    Intake/Output from previous day:  Intake/Output Summary (Last 24 hours) at 05/12/14 1529 Last data filed at 05/12/14 1330  Gross per 24 hour  Intake    100 ml  Output   3000 ml  Net  -2900 ml     LAB RESULTS: CBC  Recent Labs Lab 05/06/14 1629  05/08/14 0235 05/09/14 0400 05/10/14 0328 05/11/14 0256 05/12/14 0546  WBC 9.0  < > 14.2* 10.7* 10.8* 9.6 11.2*  HGB 12.1  < > 11.1* 11.0* 11.1* 10.1* 11.1*  HCT 36.8  < > 34.3* 33.9* 34.3* 31.0* 33.2*  PLT 216  < > 200 158 167 180 224  MCV 99.5  < > 99.7 99.7 99.4 100.6* 96.5  MCH 32.7  < > 32.3 32.4 32.2 32.8 32.3  MCHC 32.9  < > 32.4 32.4 32.4 32.6 33.4  RDW 13.4  < > 13.5 13.3 13.5 13.3 13.1  LYMPHSABS 2.5  --   --   --   --  1.0 3.6  MONOABS 0.7  --   --   --   --  0.7 1.3*  EOSABS 0.3  --   --   --   --  0.0 0.2  BASOSABS 0.0  --   --   --   --  0.0 0.0  < > = values in this interval not displayed.  Chemistries   Recent Labs Lab 05/08/14 0235 05/09/14 0400 05/10/14 0328 05/11/14 0256 05/12/14 0546  NA 137 137 138 133* 135  K 4.8 5.0 4.7 4.7 3.8  CL 108 109 106 101 102  CO2 21 19 26 25 28   GLUCOSE 76 141* 115* 363* 92  BUN 49* 49* 38* 30* 23  CREATININE 1.91* 1.38* 1.09 1.00 1.01  CALCIUM 7.9* 8.3* 8.7 8.3* 8.4  MG  --   --   --  1.4* 2.2    CBG:  Recent Labs Lab 05/11/14 1930 05/11/14 2355 05/12/14 0348 05/12/14 0737 05/12/14 1138  GLUCAP 137* 95 98 84 106*    GFR Estimated Creatinine Clearance: 72.1 mL/min (by C-G formula based on Cr of 1.01).  Coagulation profile No results for input(s): INR, PROTIME in the last 168 hours.  Cardiac Enzymes  Recent Labs Lab 05/11/14 0256  TROPONINI <0.03    Invalid input(s): POCBNP No results for input(s): DDIMER in the last 72 hours. No results for input(s): HGBA1C in the last 72 hours. No results for input(s): CHOL, HDL, LDLCALC, TRIG, CHOLHDL, LDLDIRECT in the last 72 hours. No results for input(s): TSH,  T4TOTAL, T3FREE, THYROIDAB in the last 72 hours.  Invalid input(s): FREET3 No results for input(s): VITAMINB12, FOLATE, FERRITIN, TIBC, IRON, RETICCTPCT in the last 72 hours. No results for input(s): LIPASE, AMYLASE in the last 72 hours.  Urine Studies No results for input(s): UHGB, CRYS in the last 72 hours.  Invalid input(s): UACOL, UAPR, USPG, UPH, UTP, UGL, UKET,  UBIL, UNIT, UROB, ULEU, UEPI, UWBC, URBC, UBAC, CAST, UCOM, BILUA  MICROBIOLOGY: Recent Results (from the past 240 hour(s))  MRSA PCR Screening     Status: None   Collection Time: 05/06/14  8:32 PM  Result Value Ref Range Status   MRSA by PCR NEGATIVE NEGATIVE Final    Comment:        The GeneXpert MRSA Assay (FDA approved for NASAL specimens only), is one component of a comprehensive MRSA colonization surveillance program. It is not intended to diagnose MRSA infection nor to guide or monitor treatment for MRSA infections.     RADIOLOGY STUDIES/RESULTS: Dg Chest 1 View  05/07/2014   CLINICAL DATA:  Endotracheal tube placement. Respiratory distress, acute onset. Initial encounter.  EXAM: CHEST - 1 VIEW  COMPARISON:  Chest radiograph performed 02/08/2014  FINDINGS: The patient's endotracheal tube is seen ending 5-6 cm above the carina. This could be advanced 2 cm, as deemed clinically appropriate.  The lungs are hypoexpanded. Mild bilateral atelectasis is seen. Vascular crowding is noted. No pleural effusion or pneumothorax is identified.  The cardiomediastinal silhouette is borderline enlarged. There is likely unfolding of the thoracic aorta. No acute osseous abnormalities are seen. The stomach is filled with air.  IMPRESSION: 1. Endotracheal tube seen ending 5-6 cm above the carina. This could be advanced 2 cm, as deemed clinically appropriate. Given lung hypoexpansion, would correlate clinically to ensure that the endotracheal tube is in the trachea. 2. Lungs hypoexpanded. Mild bilateral atelectasis seen. Borderline  cardiomegaly.   Electronically Signed   By: Roanna Raider M.D.   On: 05/07/2014 02:37   Ct Soft Tissue Neck Wo Contrast  05/06/2014   CLINICAL DATA:  Neck swelling for several hr. Elevated renal function. Coughing for the past 2 months since beginning Linisopril.  EXAM: CT NECK WITHOUT CONTRAST  TECHNIQUE: Multidetector CT imaging of the neck was performed following the standard protocol without intravenous contrast.  COMPARISON:  None.  FINDINGS: There is diffuse edema of the LEFT oropharynx, and hypopharynx extending along the LEFT area epiglottic fold to the supraglottic soft tissues. The soft tissues are quite low in attenuation, between 2 and 10 Hounsfield units. Mild effacement of the airway in the supraglottic region, but no evidence for impending laryngeal closure. The laryngeal true cords and false cords are not involved. Mild edema extends into the parapharyngeal soft tissues on the LEFT, extends inferolaterally to surround the LEFT submandibular gland in the submandibular space as well as the BILATERAL sublingual spaces. No focal fluid collection although there is slight edema in the retropharyngeal space. No significant involving on the RIGHT.  No radiopaque foreign body. No pathologic adenopathy. No intrinsic major or minor salivary gland abnormality. No osseous findings. Normal thyroid. Mild calcific plaque both carotid bifurcations. Cervical spondylosis. Negative visualized intracranial compartment.  IMPRESSION: Noncontrast scan of the neck most consistent with unilateral angioedema of the LEFT oropharynx, and hypopharynx, extending to the supraglottic soft tissues. Consider ENT consultation for baseline airway evaluation.  Other considerations, such is pharyngitis, or squamous cell neoplasm, are less favored but not excluded. Direct examination could help differentiate.  Mild airway narrowing notably due to mass effect from the LEFT in the supraglottic region, but there does not appear to be  impending airway obstruction. The patient should be observed closely for respiratory difficulty.  Findings discussed with ordering provider.   Electronically Signed   By: Davonna Belling M.D.   On: 05/06/2014 19:58   Dg Chest Port 1 View  05/10/2014  CLINICAL DATA:  Acute respiratory failure.  EXAM: PORTABLE CHEST - 1 VIEW  COMPARISON:  05/09/2014.  FINDINGS: Interim extubation and removal NG tube. Persistent left lower lobe atelectasis and/or infiltrate. Mild subsegmental atelectasis right lung base. Stable cardiomegaly. Pulmonary vascularity normal. Tiny left pleural effusion cannot be excluded. No pneumothorax. No acute bony abnormality.  IMPRESSION: 1. Interim extubation removal NG tube. 2. Persistent left lower lobe atelectasis and/or infiltrate with small left pleural effusion. 3. Subsegmental atelectasis right lung base.   Electronically Signed   By: Maisie Fushomas  Register   On: 05/10/2014 07:03   Dg Chest Port 1 View  05/09/2014   CLINICAL DATA:  Acute respiratory failure, history hypertension, diabetes, GERD  EXAM: PORTABLE CHEST - 1 VIEW  COMPARISON:  Portable exam 0537 hr compared to earlier exam of 05/09/2014 at 0045 hr  FINDINGS: Tip of endotracheal tube projects 4.6 cm above carina.  Nasogastric tube extends into stomach.  Upper normal heart size.  Normal mediastinal contours and pulmonary vascularity.  Atelectasis versus consolidation LEFT lower lobe, increased.  Remaining lungs clear.  No pleural effusion or pneumothorax.  IMPRESSION: Increased atelectasis versus consolidation in LEFT lower lobe.   Electronically Signed   By: Ulyses SouthwardMark  Boles M.D.   On: 05/09/2014 08:34   Dg Chest Port 1 View  05/09/2014   CLINICAL DATA:  Acute onset of respiratory abnormality. Subsequent encounter.  EXAM: PORTABLE CHEST - 1 VIEW  COMPARISON:  Chest radiograph performed 05/08/2014  FINDINGS: The patient's endotracheal tube is seen ending 3-4 cm above the carina. An enteric tube is noted extending below the diaphragm.   The lungs are well-aerated. Left basilar airspace opacity raises concern for mild pneumonia. Mild vascular congestion is noted. There is no evidence of pleural effusion or pneumothorax.  The cardiomediastinal silhouette is borderline enlarged. No acute osseous abnormalities are seen.  IMPRESSION: 1. Endotracheal tube seen ending 3-4 cm above the carina. 2. Left basilar airspace opacity raises concern for mild pneumonia. 3. Mild vascular congestion and borderline cardiomegaly.   Electronically Signed   By: Roanna RaiderJeffery  Chang M.D.   On: 05/09/2014 04:54   Dg Chest Port 1 View  05/08/2014   CLINICAL DATA:  Acute respiratory failure and hypoxia.  EXAM: PORTABLE CHEST - 1 VIEW  COMPARISON:  Yesterday  FINDINGS: Stable endotracheal tube. NG tube beyond the gastroesophageal junction. Mild cardiomegaly. Prominent upper mediastinum. Low volumes and bibasilar atelectasis.  IMPRESSION: Stable bibasilar atelectasis.   Electronically Signed   By: Maryclare BeanArt  Hoss M.D.   On: 05/08/2014 08:29   Dg Abd Portable 1v  05/07/2014   CLINICAL DATA:  Enteric tube placement.  Initial encounter.  EXAM: PORTABLE ABDOMEN - 1 VIEW  COMPARISON:  Abdominal radiograph performed 02/08/2014  FINDINGS: The patient's enteric tube is noted extending to the region of the pylorus, likely still at the antrum of the stomach, though the tip is not fully characterized.  The visualized bowel gas pattern is grossly unremarkable, though not well assessed due to motion artifact. 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 No acute osseous abnormalities are seen.  IMPRESSION: Enteric tube noted extending to the region of the pylorus, likely still at the antrum of the stomach, though the tip is not fully characterized.   Electronically Signed   By: Roanna RaiderJeffery  Chang M.D.   On: 05/07/2014 05:33    Jeoffrey MassedGHIMIRE,Soffia Doshier, MD  [facility] Pager:[phone]  If 7PM-7AM, please contact night-coverage [email or web address] Password  [email or web address] 05/12/2014, 3:29 PM   LOS: 6 days

## 2014-05-13 LAB — GLUCOSE, CAPILLARY
GLUCOSE-CAPILLARY: 87 mg/dL (ref 70–99)
Glucose-Capillary: 94 mg/dL (ref 70–99)
Glucose-Capillary: 140 mg/dL — ABNORMAL HIGH (ref 70–99)

## 2014-05-13 MED ORDER — MENTHOL 3 MG MT LOZG
1.0000 | LOZENGE | OROMUCOSAL | Status: DC | PRN
Start: 2014-05-13 — End: 2015-06-21

## 2014-05-13 MED ORDER — ACETAMINOPHEN 325 MG PO TABS
650.0000 mg | ORAL_TABLET | Freq: Four times a day (QID) | ORAL | Status: DC | PRN
  Administered 2014-05-13: 650 mg via ORAL
  Filled 2014-05-13: qty 2

## 2014-05-13 MED ORDER — PHENOL 1.4 % MT LIQD: 1.0000 | OROMUCOSAL | Status: DC | PRN

## 2014-05-13 MED ORDER — MENTHOL 3 MG MT LOZG: 1.0000 | LOZENGE | OROMUCOSAL | Status: DC | PRN

## 2014-05-13 MED ORDER — PREDNISONE 10 MG PO TABS: ORAL_TABLET | ORAL | Status: DC

## 2014-05-13 MED ORDER — KETOROLAC TROMETHAMINE 30 MG/ML IJ SOLN: 30.0000 mg | Freq: Four times a day (QID) | INTRAMUSCULAR | Status: DC | PRN

## 2014-05-13 NOTE — Discharge Instructions (Signed)
NO ACEI (ACE INHIBITORS) Or ARB in the future      Angioedema Angioedema is a sudden swelling of tissues, often of the skin. It can occur on the face or genitals or in the abdomen or other body parts. The swelling usually develops over a short period and gets better in 24 to 48 hours. It often begins during the night and is found when the person wakes up. The person may also get red, itchy patches of skin (hives). Angioedema can be dangerous if it involves swelling of the air passages.  Depending on the cause, episodes of angioedema may only happen once, come back in unpredictable patterns, or repeat for several years and then gradually fade away.  CAUSES  Angioedema can be caused by an allergic reaction to various triggers. It can also result from nonallergic causes, including reactions to drugs, immune system disorders, viral infections, or an abnormal gene that is passed to you from your parents (hereditary). For some people with angioedema, the cause is unknown.  Some things that can trigger angioedema include:   Foods.   Medicines, such as ACE inhibitors, ARBs, nonsteroidal anti-inflammatory agents, or estrogen.   Latex.   Animal saliva.   Insect stings.   Dyes used in X-rays.   Mild injury.   Dental work.  Surgery.  Stress.   Sudden changes in temperature.   Exercise. SIGNS AND SYMPTOMS   Swelling of the skin.  Hives. If these are present, there is also intense itching.  Redness in the affected area.   Pain in the affected area.  Swollen lips or tongue.  Breathing problems. This may happen if the air passages swell.  Wheezing. If internal organs are involved, there may be:   Nausea.   Abdominal pain.   Vomiting.   Difficulty swallowing.   Difficulty passing urine. DIAGNOSIS   Your health care provider will examine the affected area and take a medical and family history.  Various tests may be done to help determine the cause.  Tests may include:  Allergy skin tests to see if the problem is an allergic reaction.   Blood tests to check for hereditary angioedema.   Tests to check for underlying diseases that could cause the condition.   A review of your medicines, including over-the-counter medicines, may be done. TREATMENT  Treatment will depend on the cause of the angioedema. Possible treatments include:   Removal of anything that triggered the condition (such as stopping certain medicines).   Medicines to treat symptoms or prevent attacks. Medicines given may include:   Antihistamines.   Epinephrine injection.   Steroids.   Hospitalization may be required for severe attacks. If the air passages are affected, it can be an emergency. Tubes may need to be placed to keep the airway open. HOME CARE INSTRUCTIONS   Take all medicines as directed by your health care provider.  If you were given medicines for emergency allergy treatment, always carry them with you.  Wear a medical bracelet as directed by your health care provider.   Avoid known triggers. SEEK MEDICAL CARE IF:   You have repeat attacks of angioedema.   Your attacks are more frequent or more severe despite preventive measures.   You have hereditary angioedema and are considering having children. It is important to discuss with your health care provider the risks of passing the condition on to your children. SEEK IMMEDIATE MEDICAL CARE IF:   You have severe swelling of the mouth, tongue, or lips.  You  have difficulty breathing.   You have difficulty swallowing.   You faint. MAKE SURE YOU:  Understand these instructions.  Will watch your condition.  Will get help right away if you are not doing well or get worse. Document Released: 07/09/2001 Document Revised: 09/14/2013 Document Reviewed: 12/22/2012 [facility] Patient Information 2015 [place], [place]. This information is not intended to replace advice given to you by  your health care provider. Make sure you discuss any questions you have with your health care provider.

## 2014-05-13 NOTE — Discharge Summary (Addendum)
PATIENT DETAILS Name: Ariel Meyer Age: 63 y.o. Sex: female Date of Birth: 08/22/1950 MRN: [id number]. Admitting Physician: Merwyn Katosavid B Simonds, MD ZHY:[id number],EXBMWUPCP:STALLINGS,SHEILA, MD  Admit Date: 05/06/2014 Discharge date: 05/13/2014  Recommendations for Outpatient Follow-up:  1. No further ACE inhibitor or ARB's in the future 2. Difficult airway for intubation  PRIMARY DISCHARGE DIAGNOSIS:  Active Problems:   Acute renal failure   DM (diabetes mellitus), type 2 with renal complications   Angioedema   Goiter   Hypertension   Acute respiratory failure with hypoxia   Acute encephalopathy   Alcohol withdrawal delirium   Difficult intubation      PAST MEDICAL HISTORY: Past Medical History  Diagnosis Date  . Hypertension   . Back problem   . Complication of anesthesia     wakes up durning surgery  . GERD (gastroesophageal reflux disease)   . Arthritis   . Seasonal allergies   . Dizziness   . Hyperlipidemia   . Sleep apnea   . Shortness of breath dyspnea   . Pneumonia   . Diabetes mellitus without complication   . Depression   . Anxiety   . Chronic kidney disease     DISCHARGE MEDICATIONS: Current Discharge Medication List    START taking these medications   Details  menthol-cetylpyridinium (CEPACOL) 3 MG lozenge Take 1 lozenge (3 mg total) by mouth as needed for sore throat. Qty: 100 tablet, Refills: 0    predniSONE (DELTASONE) 10 MG tablet Take 4 tablets (40 mg) for one day on 05/14/14, then, Take 2 tablets (20 mg) for 3 days, then, Take 1 tablet   (10 mg) for 3 days, then, Take 0.5 tablets (5 mg) for 3 days and then discontinue Qty: 16 tablet, Refills: 0      CONTINUE these medications which have NOT CHANGED   Details  atorvastatin (LIPITOR) 40 MG tablet Take 40 mg by mouth daily.    cetirizine (ZYRTEC) 10 MG tablet Take 10 mg by mouth daily.    cholecalciferol (VITAMIN D) 1000 UNITS tablet Take 1,000 Units by mouth daily.    FLUoxetine (PROZAC) 40 MG capsule  Take 40 mg by mouth daily.    fluticasone (FLONASE) 50 MCG/ACT nasal spray Place 2 sprays into both nostrils daily. Refills: 2    hydrochlorothiazide (HYDRODIURIL) 25 MG tablet Take 25 mg by mouth daily.    metFORMIN (GLUCOPHAGE) 500 MG tablet Take 500 mg by mouth at bedtime.     Multiple Vitamin (MULTIVITAMIN WITH MINERALS) TABS Take 1 tablet by mouth daily.    omeprazole (PRILOSEC) 40 MG capsule Take 40 mg by mouth daily.    alendronate (FOSAMAX) 70 MG tablet Take 70 mg by mouth once a week. Take with a full glass of water on an empty stomach.      STOP taking these medications     lisinopril (PRINIVIL,ZESTRIL) 10 MG tablet      metroNIDAZOLE (FLAGYL) 500 MG tablet         ALLERGIES:   Allergies  Allergen Reactions  . Lisinopril Swelling    angioedema  . Dilaudid [Hydromorphone Hcl]     hallucination  . Norvasc [Amlodipine Besylate] Swelling  . Sudafed 12 Hour [Pseudoephedrine Hcl Er] Hypertension  . Sudafed [Pseudoephedrine Hcl] Other (See Comments)    Raises blood pressure  . Sulfa Antibiotics Other (See Comments)    Inflames her mucous membranes  . Acetaminophen Other (See Comments)    Due to liver  . Macrodantin [Nitrofurantoin Macrocrystal] Nausea And Vomiting and Other (  See Comments)    Passes out   . Macrodantin [Nitrofurantoin] Rash  . Penicillins Rash    BRIEF HPI:  See H&P, Labs, Consult and Test reports for all details in brief, patient is a 63 year old Caucasian female who was initially admitted to [facility] on 12/24 for difficulty breathing and cough. Unfortunately, her symptoms worsened, patient developed stridor, she was intubated for angioedema and transferred to the intensive care unit and was gone for further management. CT of the neck done on 12/24 was positive for unilateral angioedema of the left pharynx.  CONSULTATIONS:   pulmonary/intensive care  PERTINENT RADIOLOGIC STUDIES: Dg Chest 1 View  05/07/2014   CLINICAL DATA:   Endotracheal tube placement. Respiratory distress, acute onset. Initial encounter.  EXAM: CHEST - 1 VIEW  COMPARISON:  Chest radiograph performed 02/08/2014  FINDINGS: The patient's endotracheal tube is seen ending 5-6 cm above the carina. This could be advanced 2 cm, as deemed clinically appropriate.  The lungs are hypoexpanded. Mild bilateral atelectasis is seen. Vascular crowding is noted. No pleural effusion or pneumothorax is identified.  The cardiomediastinal silhouette is borderline enlarged. There is likely unfolding of the thoracic aorta. No acute osseous abnormalities are seen. The stomach is filled with air.  IMPRESSION: 1. Endotracheal tube seen ending 5-6 cm above the carina. This could be advanced 2 cm, as deemed clinically appropriate. Given lung hypoexpansion, would correlate clinically to ensure that the endotracheal tube is in the trachea. 2. Lungs hypoexpanded. Mild bilateral atelectasis seen. Borderline cardiomegaly.   Electronically Signed   By: Roanna RaiderJeffery  Chang M.D.   On: 05/07/2014 02:37   Ct Soft Tissue Neck Wo Contrast  05/06/2014   CLINICAL DATA:  Neck swelling for several hr. Elevated renal function. Coughing for the past 2 months since beginning Linisopril.  EXAM: CT NECK WITHOUT CONTRAST  TECHNIQUE: Multidetector CT imaging of the neck was performed following the standard protocol without intravenous contrast.  COMPARISON:  None.  FINDINGS: There is diffuse edema of the LEFT oropharynx, and hypopharynx extending along the LEFT area epiglottic fold to the supraglottic soft tissues. The soft tissues are quite low in attenuation, between 2 and 10 Hounsfield units. Mild effacement of the airway in the supraglottic region, but no evidence for impending laryngeal closure. The laryngeal true cords and false cords are not involved. Mild edema extends into the parapharyngeal soft tissues on the LEFT, extends inferolaterally to surround the LEFT submandibular gland in the submandibular space as  well as the BILATERAL sublingual spaces. No focal fluid collection although there is slight edema in the retropharyngeal space. No significant involving on the RIGHT.  No radiopaque foreign body. No pathologic adenopathy. No intrinsic major or minor salivary gland abnormality. No osseous findings. Normal thyroid. Mild calcific plaque both carotid bifurcations. Cervical spondylosis. Negative visualized intracranial compartment.  IMPRESSION: Noncontrast scan of the neck most consistent with unilateral angioedema of the LEFT oropharynx, and hypopharynx, extending to the supraglottic soft tissues. Consider ENT consultation for baseline airway evaluation.  Other considerations, such is pharyngitis, or squamous cell neoplasm, are less favored but not excluded. Direct examination could help differentiate.  Mild airway narrowing notably due to mass effect from the LEFT in the supraglottic region, but there does not appear to be impending airway obstruction. The patient should be observed closely for respiratory difficulty.  Findings discussed with ordering provider.   Electronically Signed   By: Davonna BellingJohn  Curnes M.D.   On: 05/06/2014 19:58   Dg Chest Port 1 View  05/10/2014  CLINICAL DATA:  Acute respiratory failure.  EXAM: PORTABLE CHEST - 1 VIEW  COMPARISON:  05/09/2014.  FINDINGS: Interim extubation and removal NG tube. Persistent left lower lobe atelectasis and/or infiltrate. Mild subsegmental atelectasis right lung base. Stable cardiomegaly. Pulmonary vascularity normal. Tiny left pleural effusion cannot be excluded. No pneumothorax. No acute bony abnormality.  IMPRESSION: 1. Interim extubation removal NG tube. 2. Persistent left lower lobe atelectasis and/or infiltrate with small left pleural effusion. 3. Subsegmental atelectasis right lung base.   Electronically Signed   By: Maisie Fus  Register   On: 05/10/2014 07:03   Dg Chest Port 1 View  05/09/2014   CLINICAL DATA:  Acute respiratory failure, history  hypertension, diabetes, GERD  EXAM: PORTABLE CHEST - 1 VIEW  COMPARISON:  Portable exam 0537 hr compared to earlier exam of 05/09/2014 at 0045 hr  FINDINGS: Tip of endotracheal tube projects 4.6 cm above carina.  Nasogastric tube extends into stomach.  Upper normal heart size.  Normal mediastinal contours and pulmonary vascularity.  Atelectasis versus consolidation LEFT lower lobe, increased.  Remaining lungs clear.  No pleural effusion or pneumothorax.  IMPRESSION: Increased atelectasis versus consolidation in LEFT lower lobe.   Electronically Signed   By: Ulyses Southward M.D.   On: 05/09/2014 08:34   Dg Chest Port 1 View  05/09/2014   CLINICAL DATA:  Acute onset of respiratory abnormality. Subsequent encounter.  EXAM: PORTABLE CHEST - 1 VIEW  COMPARISON:  Chest radiograph performed 05/08/2014  FINDINGS: The patient's endotracheal tube is seen ending 3-4 cm above the carina. An enteric tube is noted extending below the diaphragm.  The lungs are well-aerated. Left basilar airspace opacity raises concern for mild pneumonia. Mild vascular congestion is noted. There is no evidence of pleural effusion or pneumothorax.  The cardiomediastinal silhouette is borderline enlarged. No acute osseous abnormalities are seen.  IMPRESSION: 1. Endotracheal tube seen ending 3-4 cm above the carina. 2. Left basilar airspace opacity raises concern for mild pneumonia. 3. Mild vascular congestion and borderline cardiomegaly.   Electronically Signed   By: Roanna Raider M.D.   On: 05/09/2014 04:54   Dg Chest Port 1 View  05/08/2014   CLINICAL DATA:  Acute respiratory failure and hypoxia.  EXAM: PORTABLE CHEST - 1 VIEW  COMPARISON:  Yesterday  FINDINGS: Stable endotracheal tube. NG tube beyond the gastroesophageal junction. Mild cardiomegaly. Prominent upper mediastinum. Low volumes and bibasilar atelectasis.  IMPRESSION: Stable bibasilar atelectasis.   Electronically Signed   By: Maryclare Bean M.D.   On: 05/08/2014 08:29   Dg Abd  Portable 1v  05/07/2014   CLINICAL DATA:  Enteric tube placement.  Initial encounter.  EXAM: PORTABLE ABDOMEN - 1 VIEW  COMPARISON:  Abdominal radiograph performed 02/08/2014  FINDINGS: The patient's enteric tube is noted extending to the region of the pylorus, likely still at the antrum of the stomach, though the tip is not fully characterized.  The visualized bowel gas pattern is grossly unremarkable, though not well assessed due to motion artifact. 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 No acute osseous abnormalities are seen.  IMPRESSION: Enteric tube noted extending to the region of the pylorus, likely still at the antrum of the stomach, though the tip is not fully characterized.   Electronically Signed   By: Roanna Raider M.D.   On: 05/07/2014 05:33     PERTINENT LAB RESULTS: CBC:  Recent Labs  05/11/14 0256 05/12/14 0546  WBC 9.6 11.2*  HGB 10.1* 11.1*  HCT 31.0*  33.2*  PLT 180 224   CMET CMP     Component Value Date/Time   NA 135 05/12/2014 0546   K 3.8 05/12/2014 0546   CL 102 05/12/2014 0546   CO2 28 05/12/2014 0546   GLUCOSE 92 05/12/2014 0546   BUN 23 05/12/2014 0546   CREATININE 1.01 05/12/2014 0546   CALCIUM 8.4 05/12/2014 0546   PROT 6.7 05/07/2014 0525   ALBUMIN 3.4* 05/07/2014 0525   AST 33 05/07/2014 0525   ALT 33 05/07/2014 0525   ALKPHOS 78 05/07/2014 0525   BILITOT 0.8 05/07/2014 0525   GFRNONAA 58* 05/12/2014 0546   GFRAA 67* 05/12/2014 0546    GFR Estimated Creatinine Clearance: 72.1 mL/min (by C-G formula based on Cr of 1.01). No results for input(s): LIPASE, AMYLASE in the last 72 hours.  Recent Labs  05/11/14 0256  TROPONINI <0.03   Invalid input(s): POCBNP No results for input(s): DDIMER in the last 72 hours. No results for input(s): HGBA1C in the last 72 hours. No results for input(s): CHOL, HDL, LDLCALC, TRIG, CHOLHDL, LDLDIRECT in the last 72 hours. No results for input(s): TSH, T4TOTAL, T3FREE,  THYROIDAB in the last 72 hours.  Invalid input(s): FREET3 No results for input(s): VITAMINB12, FOLATE, FERRITIN, TIBC, IRON, RETICCTPCT in the last 72 hours. Coags: No results for input(s): INR in the last 72 hours.  Invalid input(s): PT Microbiology: Recent Results (from the past 240 hour(s))  MRSA PCR Screening     Status: None   Collection Time: 05/06/14  8:32 PM  Result Value Ref Range Status   MRSA by PCR NEGATIVE NEGATIVE Final    Comment:        The GeneXpert MRSA Assay (FDA approved for NASAL specimens only), is one component of a comprehensive MRSA colonization surveillance program. It is not intended to diagnose MRSA infection nor to guide or monitor treatment for MRSA infections.      BRIEF HOSPITAL COURSE:    Acute hypoxic respiratory failure: Secondary to angioedema. Intubated on 12/25-difficult intubation-extubated on 12/27. Doing well without any major issues currently. Still with some mild sore throat/irritation presumably from difficult airway/intubation, suspect this will improve with time and supportive care. No stridor on exam, easily moving air.   Angioedema: Presumed secondary to lisinopril.CT Neck 12/24 showed unilateral angioedema of left pharynx, mild airway narrowing. Unfortunately deteriorated on admission and subsequently required intubation. Currently maintained on prednisone, will taper gradually-see above med list. No stridor, lungs clear, seems to have resolved.. No further lisinopril or Ace inhibitors-I have educated the patient and her husband at bedside regarding informing her doctors in the future that she is allergic to ACEI!!   Acute encephalopathy: Multifactorial-suspect secondary to ICU delirium, medications, and possible EtOH withdrawal. Currently awake, alert, mental status is back to baseline.   Hypertension: Resume HCTZ as blood pressure creeping up. Follow-up with PCP for further optimization. No further lisinopril or Ace inhibitors-I  have educated the patient and her husband at bedside regarding informing her doctors in the future that she is allergic to ACEI!!   Acute renal failure: Likely prerenal, resolved with supportive care.   DM (diabetes mellitus), type 2 with renal complications: CBGs stable with SSI while inpatient. Resume metformin on discharge.   Alcohol abuse: Drinks vodka every evening-counseled. On Ativan per CIWA protocol while inpatient.. No signs of withdrawal at the time of discharge.   GERD: Resume PPI on discharge.   Dyslipidemia: Continue with Lipitor.   TODAY-DAY OF DISCHARGE:  Subjective:   Zohal  Secrist today has no headache,no chest abdominal pain,no new weakness tingling or numbness, feels much better wants to go home today.   Objective:   Blood pressure 139/79, pulse 79, temperature 97.6 F (36.4 C), temperature source Oral, resp. rate 18, height 5\' 9"  (1.753 m), weight 103.7 kg (228 lb 9.9 oz), SpO2 96 %.  Intake/Output Summary (Last 24 hours) at 05/13/14 1400 Last data filed at 05/13/14 1323  Gross per 24 hour  Intake    358 ml  Output   1000 ml  Net   -642 ml   Filed Weights   05/06/14 2025 05/07/14 0400 05/08/14 0600  Weight: 99.8 kg (220 lb 0.3 oz) 100.1 kg (220 lb 10.9 oz) 103.7 kg (228 lb 9.9 oz)    Exam Awake Alert, Oriented *3, No new F.N deficits, Normal affect [place].AT,PERRAL Supple Neck,No JVD, No cervical lymphadenopathy appriciated. No stridor on exam. No rales or rhonchi as well Symmetrical Chest wall movement, Good air movement bilaterally, CTAB RRR,No Gallops,Rubs or new Murmurs, No Parasternal Heave +ve B.Sounds, Abd Soft, Non tender, No organomegaly appriciated, No rebound -guarding or rigidity. No Cyanosis, Clubbing or edema, No new Rash or bruise  DISCHARGE CONDITION: Stable  DISPOSITION: Home  DISCHARGE INSTRUCTIONS:    Activity:  As tolerated   Diet recommendation: Diabetic Diet Heart Healthy diet  Discharge Instructions    Call MD  for:  difficulty breathing, headache or visual disturbances    Complete by:  As directed      Diet - low sodium heart healthy    Complete by:  As directed      Diet Carb Modified    Complete by:  As directed      Increase activity slowly    Complete by:  As directed            Follow-up Information    Follow up with Warrick Parisian, MD. Schedule an appointment as soon as possible for a visit in 1 week.   Specialty:  Family Medicine     Total Time spent on discharge equals 45 minutes.  SignedJeoffrey Massed 05/13/2014 2:00 PM

## 2014-05-13 NOTE — Evaluation (Signed)
Physical Therapy Evaluation Patient Details Name: Paulo FruitMalona Rockholt MRN: [id number] DOB: 02/21/1951 Today's Date: 05/13/2014   History of Present Illness  adm 05/06/14 with angioedema; required intubation 12/25-12/27/15. Extubated with ICU delirium PMHx-kyphoplasty, DM, ETOH abuse  Clinical Impression  Patient evaluated by Physical Therapy with no further acute PT needs identified. All education has been completed and the patient has no further questions.  See below for any follow-up Physial Therapy or equipment needs. PT is signing off. Thank you for this referral.     Follow Up Recommendations No PT follow up    Equipment Recommendations  None recommended by PT    Recommendations for Other Services       Precautions / Restrictions Precautions Precautions: Fall Precaution Comments: pt recently "graduated" from OPPT for her balance      Mobility  Bed Mobility Overal bed mobility: Modified Independent                Transfers Overall transfer level: Independent Equipment used: None                Ambulation/Gait Ambulation/Gait assistance: Supervision Ambulation Distance (Feet): 180 Feet Assistive device: None Gait Pattern/deviations: Step-through pattern;Decreased stride length Gait velocity: decr, but able to vary up and down   General Gait Details: initially reported feeling a bit unsteady (reports had not walked in hall since admission); no loss of balance with changes in speed, head turns, quick stops, or turns  Information systems managertairs            Wheelchair Mobility    Modified Rankin (Stroke Patients Only)       Balance Overall balance assessment: Needs assistance                       [place]berg - Eyes Opened: 5 (step to her Left due to imbalance)     High Level Balance Comments: feet shoulder width with eyes closed WNL x 30 sec; pt demonstrated or verbalized her HEP from recent round of OPPT for her back and balance; instructed to perform  exercises at counter or standing in corner with back to the walls and chair in front for UE support (for safety); pt with LOB to her Lt when attempted standing marching             Pertinent Vitals/Pain Pain Assessment: No/denies pain    Home Living Family/patient expects to be discharged to:: Private residence Living Arrangements: Spouse/significant other Available Help at Discharge: Family Type of Home: House Home Access: Stairs to enter Entrance Stairs-Rails: Doctor, general practiceight;Left Entrance Stairs-Number of Steps: 4 Home Layout: Two level;Able to live on main level with bedroom/bathroom (son and wife live upstairs) Home Equipment: Dan HumphreysWalker - 2 wheels;Walker - 4 wheels;Cane - single point;Grab bars - tub/shower;Grab bars - toilet (regular toilet)      Prior Function Level of Independence: Independent               Hand Dominance   Dominant Hand: Right    Extremity/Trunk Assessment   Upper Extremity Assessment: Overall WFL for tasks assessed           Lower Extremity Assessment: Overall WFL for tasks assessed      Cervical / Trunk Assessment: Normal  Communication   Communication: Other (comment) (hoarse)  Cognition Arousal/Alertness: Awake/alert Behavior During Therapy: WFL for tasks assessed/performed Overall Cognitive Status: Within Functional Limits for tasks assessed  General Comments General comments (skin integrity, edema, etc.): Husband present;     Exercises   pt demonstrated or verbalized her HEP from recent round of OPPT for her back and balance; instructed to perform exercises at counter or standing in corner with back to the walls and chair in front for UE support (for safety); pt with LOB to her Lt when attempted standing marching       Assessment/Plan    PT Assessment Patent does not need any further PT services (pt independent with HEP)  PT Diagnosis Generalized weakness   PT Problem List    PT Treatment  Interventions     PT Goals (Current goals can be found in the Care Plan section) Acute Rehab PT Goals Patient Stated Goal: return home PT Goal Formulation: All assessment and education complete, DC therapy    Frequency     Barriers to discharge        Co-evaluation               End of Session Equipment Utilized During Treatment: Gait belt Activity Tolerance: Patient tolerated treatment well Patient left: in bed;with call bell/phone within reach;with family/visitor present Nurse Communication: Mobility status;Other (comment) (OK for d/c)         Time: [id number] PT Time Calculation (min) (ACUTE ONLY): 19 min   Charges:   PT Evaluation $Initial PT Evaluation Tier I: 1 Procedure PT Treatments $Therapeutic Exercise: 8-22 mins   PT G Codes:        Dennard Vezina 05/13/2014, 2:27 PM Pager [phone]]

## 2014-06-14 ENCOUNTER — Other Ambulatory Visit: Payer: Self-pay | Admitting: Family

## 2014-06-14 DIAGNOSIS — Z7289 Other problems related to lifestyle: Secondary | ICD-10-CM

## 2014-06-14 DIAGNOSIS — R140 Abdominal distension (gaseous): Secondary | ICD-10-CM

## 2014-06-14 DIAGNOSIS — Z789 Other specified health status: Secondary | ICD-10-CM

## 2014-06-18 ENCOUNTER — Ambulatory Visit
Admission: RE | Admit: 2014-06-18 | Discharge: 2014-06-18 | Disposition: A | Discharge status: Home / Self Care | Payer: BC Managed Care – PPO | Source: Ambulatory Visit | Attending: Family | Admitting: Family

## 2014-06-18 DIAGNOSIS — R140 Abdominal distension (gaseous): Secondary | ICD-10-CM

## 2014-06-18 DIAGNOSIS — Z7289 Other problems related to lifestyle: Secondary | ICD-10-CM

## 2014-06-18 DIAGNOSIS — Z789 Other specified health status: Secondary | ICD-10-CM

## 2014-09-09 ENCOUNTER — Other Ambulatory Visit: Payer: Self-pay | Admitting: Nurse Practitioner

## 2014-09-09 ENCOUNTER — Other Ambulatory Visit (HOSPITAL_COMMUNITY)
Admission: RE | Admit: 2014-09-09 | Discharge: 2014-09-09 | Disposition: A | Discharge status: Home / Self Care | Payer: Medicare PPO | Source: Ambulatory Visit | Attending: Nurse Practitioner | Admitting: Nurse Practitioner

## 2014-09-09 DIAGNOSIS — Z1151 Encounter for screening for human papillomavirus (HPV): Secondary | ICD-10-CM | POA: Diagnosis present

## 2014-09-09 DIAGNOSIS — N6001 Solitary cyst of right breast: Secondary | ICD-10-CM

## 2014-09-09 DIAGNOSIS — Z01419 Encounter for gynecological examination (general) (routine) without abnormal findings: Secondary | ICD-10-CM | POA: Diagnosis present

## 2014-09-10 LAB — CYTOLOGY - PAP

## 2014-09-28 ENCOUNTER — Ambulatory Visit
Admission: RE | Admit: 2014-09-28 | Discharge: 2014-09-28 | Disposition: A | Discharge status: Home / Self Care | Payer: Medicare PPO | Source: Ambulatory Visit | Attending: Nurse Practitioner | Admitting: Nurse Practitioner

## 2014-09-28 DIAGNOSIS — N6001 Solitary cyst of right breast: Secondary | ICD-10-CM

## 2014-12-09 ENCOUNTER — Other Ambulatory Visit: Payer: Self-pay

## 2014-12-09 DIAGNOSIS — Z1231 Encounter for screening mammogram for malignant neoplasm of breast: Secondary | ICD-10-CM

## 2014-12-24 DIAGNOSIS — M47817 Spondylosis without myelopathy or radiculopathy, lumbosacral region: Secondary | ICD-10-CM | POA: Diagnosis not present

## 2014-12-24 DIAGNOSIS — K5900 Constipation, unspecified: Secondary | ICD-10-CM | POA: Diagnosis not present

## 2014-12-24 DIAGNOSIS — M8000XS Age-related osteoporosis with current pathological fracture, unspecified site, sequela: Secondary | ICD-10-CM | POA: Diagnosis not present

## 2014-12-24 DIAGNOSIS — G894 Chronic pain syndrome: Secondary | ICD-10-CM | POA: Diagnosis not present

## 2014-12-31 DIAGNOSIS — I10 Essential (primary) hypertension: Secondary | ICD-10-CM | POA: Diagnosis not present

## 2014-12-31 DIAGNOSIS — Z79899 Other long term (current) drug therapy: Secondary | ICD-10-CM | POA: Diagnosis not present

## 2014-12-31 DIAGNOSIS — E119 Type 2 diabetes mellitus without complications: Secondary | ICD-10-CM | POA: Diagnosis not present

## 2014-12-31 DIAGNOSIS — R0683 Snoring: Secondary | ICD-10-CM | POA: Diagnosis not present

## 2014-12-31 DIAGNOSIS — K219 Gastro-esophageal reflux disease without esophagitis: Secondary | ICD-10-CM | POA: Diagnosis not present

## 2014-12-31 DIAGNOSIS — R0600 Dyspnea, unspecified: Secondary | ICD-10-CM | POA: Diagnosis not present

## 2014-12-31 DIAGNOSIS — M810 Age-related osteoporosis without current pathological fracture: Secondary | ICD-10-CM | POA: Diagnosis not present

## 2014-12-31 DIAGNOSIS — E785 Hyperlipidemia, unspecified: Secondary | ICD-10-CM | POA: Diagnosis not present

## 2014-12-31 DIAGNOSIS — R42 Dizziness and giddiness: Secondary | ICD-10-CM | POA: Diagnosis not present

## 2015-01-12 ENCOUNTER — Other Ambulatory Visit: Payer: Self-pay

## 2015-01-12 ENCOUNTER — Other Ambulatory Visit: Payer: Self-pay | Admitting: Family

## 2015-01-12 DIAGNOSIS — E2839 Other primary ovarian failure: Secondary | ICD-10-CM

## 2015-01-14 ENCOUNTER — Ambulatory Visit
Admission: RE | Admit: 2015-01-14 | Discharge: 2015-01-14 | Disposition: A | Discharge status: Home / Self Care | Payer: Medicare PPO | Source: Ambulatory Visit

## 2015-01-14 DIAGNOSIS — Z1231 Encounter for screening mammogram for malignant neoplasm of breast: Secondary | ICD-10-CM | POA: Diagnosis not present

## 2015-01-19 ENCOUNTER — Other Ambulatory Visit: Payer: Self-pay | Admitting: Family

## 2015-01-19 DIAGNOSIS — R928 Other abnormal and inconclusive findings on diagnostic imaging of breast: Secondary | ICD-10-CM

## 2015-01-25 ENCOUNTER — Ambulatory Visit
Admission: RE | Admit: 2015-01-25 | Discharge: 2015-01-25 | Disposition: A | Discharge status: Home / Self Care | Payer: Medicare PPO | Source: Ambulatory Visit | Attending: Family | Admitting: Family

## 2015-01-25 ENCOUNTER — Other Ambulatory Visit: Payer: Self-pay | Admitting: Family

## 2015-01-25 DIAGNOSIS — R928 Other abnormal and inconclusive findings on diagnostic imaging of breast: Secondary | ICD-10-CM

## 2015-01-25 DIAGNOSIS — N6310 Unspecified lump in the right breast, unspecified quadrant: Secondary | ICD-10-CM

## 2015-01-25 DIAGNOSIS — N63 Unspecified lump in breast: Secondary | ICD-10-CM | POA: Diagnosis not present

## 2015-02-01 ENCOUNTER — Ambulatory Visit
Admission: RE | Admit: 2015-02-01 | Discharge: 2015-02-01 | Disposition: A | Discharge status: Home / Self Care | Payer: Medicare PPO | Source: Ambulatory Visit | Attending: Family | Admitting: Family

## 2015-02-01 ENCOUNTER — Other Ambulatory Visit: Payer: Self-pay | Admitting: Family

## 2015-02-01 DIAGNOSIS — N6310 Unspecified lump in the right breast, unspecified quadrant: Secondary | ICD-10-CM

## 2015-02-01 DIAGNOSIS — N63 Unspecified lump in breast: Secondary | ICD-10-CM | POA: Diagnosis not present

## 2015-02-01 DIAGNOSIS — N6489 Other specified disorders of breast: Secondary | ICD-10-CM | POA: Diagnosis not present

## 2015-02-02 ENCOUNTER — Ambulatory Visit
Admission: RE | Admit: 2015-02-02 | Discharge: 2015-02-02 | Disposition: A | Discharge status: Home / Self Care | Payer: Medicare PPO | Source: Ambulatory Visit | Attending: Family | Admitting: Family

## 2015-02-02 DIAGNOSIS — N6310 Unspecified lump in the right breast, unspecified quadrant: Secondary | ICD-10-CM

## 2015-02-04 ENCOUNTER — Ambulatory Visit
Admission: RE | Admit: 2015-02-04 | Discharge: 2015-02-04 | Disposition: A | Discharge status: Home / Self Care | Payer: Medicare PPO | Source: Ambulatory Visit | Attending: Family | Admitting: Family

## 2015-02-04 DIAGNOSIS — Z780 Asymptomatic menopausal state: Secondary | ICD-10-CM | POA: Diagnosis not present

## 2015-02-04 DIAGNOSIS — E2839 Other primary ovarian failure: Secondary | ICD-10-CM

## 2015-02-04 DIAGNOSIS — M859 Disorder of bone density and structure, unspecified: Secondary | ICD-10-CM | POA: Diagnosis not present

## 2015-04-26 DIAGNOSIS — H66001 Acute suppurative otitis media without spontaneous rupture of ear drum, right ear: Secondary | ICD-10-CM | POA: Diagnosis not present

## 2015-05-02 DIAGNOSIS — E785 Hyperlipidemia, unspecified: Secondary | ICD-10-CM | POA: Diagnosis not present

## 2015-05-02 DIAGNOSIS — I10 Essential (primary) hypertension: Secondary | ICD-10-CM | POA: Diagnosis not present

## 2015-05-02 DIAGNOSIS — F418 Other specified anxiety disorders: Secondary | ICD-10-CM | POA: Diagnosis not present

## 2015-05-02 DIAGNOSIS — Z789 Other specified health status: Secondary | ICD-10-CM | POA: Diagnosis not present

## 2015-05-02 DIAGNOSIS — K219 Gastro-esophageal reflux disease without esophagitis: Secondary | ICD-10-CM | POA: Diagnosis not present

## 2015-05-02 DIAGNOSIS — Z23 Encounter for immunization: Secondary | ICD-10-CM | POA: Diagnosis not present

## 2015-05-02 DIAGNOSIS — E119 Type 2 diabetes mellitus without complications: Secondary | ICD-10-CM | POA: Diagnosis not present

## 2015-05-02 DIAGNOSIS — Z7984 Long term (current) use of oral hypoglycemic drugs: Secondary | ICD-10-CM | POA: Diagnosis not present

## 2015-05-02 DIAGNOSIS — M810 Age-related osteoporosis without current pathological fracture: Secondary | ICD-10-CM | POA: Diagnosis not present

## 2015-05-02 DIAGNOSIS — G4733 Obstructive sleep apnea (adult) (pediatric): Secondary | ICD-10-CM | POA: Diagnosis not present

## 2015-05-03 DIAGNOSIS — G4733 Obstructive sleep apnea (adult) (pediatric): Secondary | ICD-10-CM | POA: Diagnosis not present

## 2015-06-01 ENCOUNTER — Other Ambulatory Visit: Payer: Self-pay | Admitting: Family

## 2015-06-01 ENCOUNTER — Ambulatory Visit
Admission: RE | Admit: 2015-06-01 | Discharge: 2015-06-01 | Disposition: A | Discharge status: Home / Self Care | Payer: PRIVATE HEALTH INSURANCE | Source: Ambulatory Visit | Attending: Family | Admitting: Family

## 2015-06-01 DIAGNOSIS — R059 Cough, unspecified: Secondary | ICD-10-CM

## 2015-06-01 DIAGNOSIS — R05 Cough: Secondary | ICD-10-CM

## 2015-06-09 ENCOUNTER — Ambulatory Visit
Admission: RE | Admit: 2015-06-09 | Discharge: 2015-06-09 | Disposition: A | Discharge status: Home / Self Care | Payer: PRIVATE HEALTH INSURANCE | Source: Ambulatory Visit | Attending: Family | Admitting: Family

## 2015-06-09 ENCOUNTER — Other Ambulatory Visit: Payer: Self-pay | Admitting: Family

## 2015-06-09 DIAGNOSIS — R059 Cough, unspecified: Secondary | ICD-10-CM

## 2015-06-09 DIAGNOSIS — R05 Cough: Secondary | ICD-10-CM

## 2015-06-21 ENCOUNTER — Ambulatory Visit (INDEPENDENT_AMBULATORY_CARE_PROVIDER_SITE_OTHER): Payer: Medicare Other | Admitting: Internal Medicine

## 2015-06-21 ENCOUNTER — Encounter: Payer: Self-pay | Admitting: Internal Medicine

## 2015-06-21 VITALS — BP 124/80 | HR 89 | Ht 69.0 in | Wt 227.0 lb

## 2015-06-21 DIAGNOSIS — R05 Cough: Secondary | ICD-10-CM | POA: Diagnosis not present

## 2015-06-21 DIAGNOSIS — R058 Other specified cough: Secondary | ICD-10-CM | POA: Insufficient documentation

## 2015-06-21 NOTE — Assessment & Plan Note (Signed)
The most common causes of chronic cough in immunocompetent adults include the following: upper airway cough syndrome (UACS), previously referred to as postnasal drip syndrome (PNDS), which is caused by variety of rhinosinus conditions; (2) asthma; (3) GERD; (4) chronic bronchitis from cigarette smoking or other inhaled environmental irritants; (5) nonasthmatic eosinophilic bronchitis; and (6) bronchiectasis.   These conditions, singly or in combination, have accounted for up to 94% of the causes of chronic cough in prospective studies.   Other conditions have constituted no >6% of the causes in prospective studies These have included bronchogenic carcinoma, chronic interstitial pneumonia, sarcoidosis, left ventricular failure, ACEI-induced cough, and aspiration from a condition associated with pharyngeal dysfunction.    Chronic cough is often simultaneously caused by more than one condition. A single cause has been found from 38 to 82% of the time, multiple causes from 18 to 62%. Multiply caused cough has been the result of three diseases up to 42% of the time.       Based on hx and exam, this is most likely:  Classic Upper airway cough syndrome, so named because it's frequently impossible to sort out how much is  CR/sinusitis with freq throat clearing (which can be related to primary GERD)   vs  causing  secondary (" extra esophageal")  GERD from wide swings in gastric pressure that occur with throat clearing, often  promoting self use of mint and menthol lozenges that reduce the lower esophageal sphincter tone and exacerbate the problem further in a cyclical fashion.   These are the same pts (now being labeled as having "irritable larynx syndrome" by some cough centers) who not infrequently have a history of having failed to tolerate ace inhibitors,  dry powder inhalers or biphosphonates or report having atypical reflux symptoms that don't respond to standard doses of PPI , and are easily confused as  having aecopd or asthma flares by even experienced allergists/ pulmonologists.   The first step is to maximize acid suppression/ GERD rx and eliminate fosfamax then regroup if the cough persists.  I had an extended discussion with the patient reviewing all relevant studies completed to date and  lasting 35 minutes of a inital visit    Each maintenance medication was reviewed in detail including most importantly the difference between maintenance and prns and under what circumstances the prns are to be triggered using an action plan format that is not reflected in the computer generated alphabetically organized AVS.    Please see instructions for details which were reviewed in writing and the patient given a copy highlighting the part that I personally wrote and discussed at today's ov.

## 2015-06-21 NOTE — Progress Notes (Signed)
Subjective:    Patient ID: Ariel Meyer, female    DOB: [birth year],   MRN: [id number]  HPI   78 yowf quit smoking 2011 and did fine until ACE induced laryngospasm xmas resulting in [facility] and ever since has had persistent cough referred to pulmonary clinic 06/21/2015 by Dr Victorino Dike.   06/21/2015 1st [facility] office visit/ Ariel Meyer   Chief Complaint  Patient presents with  . Pulmonary Consult    Referred by Boneta Lucks, PA. Pt c/o SOB since Fall of 2016. She states it started as a sinus infection. She gets SOB bending over and when she walks approx 50 yards on a flat surface. She also has trouble walking up an incline.   cough is day > noct and dry and breathing is the same with ex as it ever was since the ET recovery assoc with overt HB on prn prilosec and maint rx fosfamax   No obvious other patterns in day to day or daytime variabilty or assoc  Cp or chest tightness, subjective wheeze overt sinus   symptoms. No unusual exp hx or h/o childhood pna/ asthma or knowledge of premature birth.  Sleeping ok without nocturnal  or early am exacerbation  of respiratory  c/o's or need for noct saba. Also denies any obvious fluctuation of symptoms with weather or environmental changes or other aggravating or alleviating factors except as outlined above   Current Medications, Allergies, Complete Past Medical History, Past Surgical History, Family History, and Social History were reviewed in [employer] record.                 Review of Systems  Constitutional: Negative for fever, chills and unexpected weight change.  HENT: Negative for congestion, dental problem, ear pain, nosebleeds, postnasal drip, rhinorrhea, sinus pressure, sneezing, sore throat, trouble swallowing and voice change.   Eyes: Negative for visual disturbance.  Respiratory: Positive for shortness of breath. Negative for cough and choking.   Cardiovascular: Negative for chest pain and  leg swelling.  Gastrointestinal: Negative for vomiting, abdominal pain and diarrhea.  Genitourinary: Negative for difficulty urinating.  Musculoskeletal: Negative for arthralgias.  Skin: Negative for rash.  Neurological: Negative for tremors, syncope and headaches.  Hematological: Does not bruise/bleed easily.       Objective:   Physical Exam    amb wf nad with raspy voice   Wt Readings from Last 3 Encounters:  06/21/15 227 lb (102.967 kg)  05/08/14 228 lb 9.9 oz (103.7 kg)  02/11/14 238 lb (107.956 kg)    Vital signs reviewed   HEENT: nl dentition, turbinates, and oropharynx. Nl external ear canals without cough reflex   NECK :  without JVD/Nodes/TM/ nl carotid upstrokes bilaterally   LUNGS: no acc muscle use,  Nl contour chest which is clear to A and P bilaterally without cough on insp or exp maneuvers   CV:  RRR  no s3 or murmur or increase in P2, no edema   ABD:  soft and nontender with nl inspiratory excursion in the supine position. No bruits or organomegaly, bowel sounds nl  MS:  Nl gait/ ext warm without deformities, calf tenderness, cyanosis or clubbing No obvious joint restrictions   SKIN: warm and dry without lesions    NEURO:  alert, approp, nl sensorium with  no motor deficits       I personally reviewed images and agree with radiology impression as follows:  CXR:  06/09/15 Essentially stable appearance of  the chest since the previous study. Minimal atelectasis or scarring persists in the lingula.         Assessment & Plan:

## 2015-06-21 NOTE — Patient Instructions (Addendum)
Prilosec 20 mg Take 30-60 min before first meal of the day and pepcid ac 20 mg @ bedtime  Stop fosfamax   GERD (REFLUX)  is an extremely common cause of respiratory symptoms just like yours , many times with no obvious heartburn at all.    It can be treated with medication, but also with lifestyle changes including elevation of the head of your bed (ideally with 6 inch  bed blocks),  Smoking cessation, avoidance of late meals, excessive alcohol, and avoid fatty foods, chocolate, peppermint, colas, red wine, and acidic juices such as orange juice.  NO MINT OR MENTHOL PRODUCTS SO NO COUGH DROPS  USE SUGARLESS CANDY INSTEAD (Jolley ranchers or Stover's or Life Savers) or even ice chips will also do - the key is to swallow to prevent all throat clearing. NO OIL BASED VITAMINS - use powdered substitutes.    Please schedule a follow up office visit in 6 weeks, call sooner if needed with pfts on return

## 2015-08-08 ENCOUNTER — Ambulatory Visit (INDEPENDENT_AMBULATORY_CARE_PROVIDER_SITE_OTHER): Payer: Medicare Other | Admitting: Internal Medicine

## 2015-08-08 ENCOUNTER — Encounter: Payer: Self-pay | Admitting: Internal Medicine

## 2015-08-08 ENCOUNTER — Ambulatory Visit (HOSPITAL_COMMUNITY)
Admission: RE | Admit: 2015-08-08 | Discharge: 2015-08-08 | Disposition: A | Discharge status: Home / Self Care | Payer: Medicare Other | Source: Ambulatory Visit | Attending: Internal Medicine | Admitting: Internal Medicine

## 2015-08-08 VITALS — BP 118/60 | HR 121 | Ht 69.0 in | Wt 227.0 lb

## 2015-08-08 DIAGNOSIS — R058 Other specified cough: Secondary | ICD-10-CM

## 2015-08-08 DIAGNOSIS — R05 Cough: Secondary | ICD-10-CM | POA: Diagnosis not present

## 2015-08-08 LAB — PULMONARY FUNCTION TEST
DL/VA % pred: 65 %
DL/VA: 3.48 ml/min/mmHg/L
DLCO UNC % PRED: 54 %
DLCO UNC: 16.82 ml/min/mmHg
FEF 25-75 PRE: 2.17 L/s
FEF 25-75 Post: 3.17 L/sec
FEF2575-%Change-Post: 45 %
FEF2575-%Pred-Post: 133 %
FEF2575-%Pred-Pre: 91 %
FEV1-%Change-Post: 8 %
FEV1-%PRED-PRE: 82 %
FEV1-%Pred-Post: 89 %
FEV1-POST: 2.58 L
FEV1-Pre: 2.39 L
FEV1FVC-%Change-Post: 0 %
FEV1FVC-%Pred-Pre: 102 %
FEV6-%CHANGE-POST: 8 %
FEV6-%PRED-POST: 89 %
FEV6-%PRED-PRE: 82 %
FEV6-POST: 3.22 L
FEV6-PRE: 2.98 L
FEV6FVC-%CHANGE-POST: 0 %
FEV6FVC-%PRED-POST: 101 %
FEV6FVC-%PRED-PRE: 102 %
FVC-%Change-Post: 8 %
FVC-%Pred-Post: 87 %
FVC-%Pred-Pre: 80 %
FVC-Post: 3.29 L
FVC-Pre: 3.03 L
POST FEV6/FVC RATIO: 98 %
Post FEV1/FVC ratio: 78 %
Pre FEV1/FVC ratio: 79 %
Pre FEV6/FVC Ratio: 99 %
RV % PRED: 96 %
RV: 2.29 L
TLC % pred: 93 %
TLC: 5.42 L

## 2015-08-08 MED ORDER — ALBUTEROL SULFATE (2.5 MG/3ML) 0.083% IN NEBU
2.5000 mg | INHALATION_SOLUTION | Freq: Once | RESPIRATORY_TRACT | Status: AC
  Administered 2015-08-08: 2.5 mg via RESPIRATORY_TRACT

## 2015-08-08 NOTE — Assessment & Plan Note (Addendum)
Stop acei after only one week 04/2014 with laryngospasm requiring ET and coughing ever since on fosfamax > d/c 06/21/15  - cough resolved 08/08/2015 off fosfamax and all gerd rx > ok to rechallenge with fosfamax but low threshold to d/c / change to reclast prn - PFTs no airflow obst off all rx 08/08/2015     Classic Upper airway cough syndrome, so named because it's frequently impossible to sort out how much is  CR/sinusitis with freq throat clearing (which can be related to primary GERD)   vs  causing  secondary (" extra esophageal")  GERD from wide swings in gastric pressure that occur with throat clearing, often  promoting self use of mint and menthol lozenges that reduce the lower esophageal sphincter tone and exacerbate the problem further in a cyclical fashion.   These are the same pts (now being labeled as having "irritable larynx syndrome" by some cough centers) who not infrequently have a history of having failed to tolerate ace inhibitors,  dry powder inhalers or biphosphonates or report having atypical reflux symptoms that don't respond to standard doses of PPI , and are easily confused as having aecopd or asthma flares by even experienced allergists/ pulmonologists.    I had an extended discussion with the patient reviewing all relevant studies completed to date and  lasting 15 to 20 minutes of a 25 minute visit    Each maintenance medication was reviewed in detail including most importantly the difference between maintenance and prns and under what circumstances the prns are to be triggered using an action plan format that is not reflected in the computer generated alphabetically organized AVS.    Please see instructions for details which were reviewed in writing and the patient given a copy highlighting the part that I personally wrote and discussed at today's ov.  She has no evidence of asthma or any lung dz > pulmonary f/u is prn

## 2015-08-08 NOTE — Progress Notes (Signed)
Subjective:    Patient ID: Ariel Meyer, female    DOB: 1951-01-13   MRN: [id number]     Brief patient profile:  65 yowf quit smoking 2011 and did fine until ACE induced laryngospasm xmas resulting in et x 4 [facility] and ever since has had persistent cough referred to pulmonary clinic 06/21/2015 by Ariel Meyer.   History of Present Illness  06/21/2015 1st [facility] office visit/ Ariel Meyer   Chief Complaint  Patient presents with  . Pulmonary Consult    Referred by Ariel Lucks, PA. Pt c/o SOB since Fall of 2016. She states it started as a sinus infection. She gets SOB bending over and when she walks approx 50 yards on a flat surface. She also has trouble walking up an incline.   cough is day > noct and dry and breathing is the same with ex as it ever was since the ET recovery assoc with overt HB on prn prilosec and maint rx fosfamax  rec Prilosec 20 mg Take 30-60 min before first meal of the day and pepcid ac 20 mg @ bedtime Stop fosfamax  GERD diet    08/08/2015  f/u ov/Ariel Meyer re: uacs no longer on any gerd rx/ off fosfamax  Chief Complaint  Patient presents with  . Follow-up    PFT done today at [facility]. Her cough has resolved.  Breathing is unchanged. She started CPAP recently.    no aerobics but otherwise good activity tolerance   No obvious day to day or daytime variability or assoc chronic cough or cp or chest tightness, subjective wheeze or overt sinus or hb symptoms. No unusual exp hx or h/o childhood pna/ asthma or knowledge of premature birth.  Sleeping ok without nocturnal  or early am exacerbation  of respiratory  c/o's or need for noct saba. Also denies any obvious fluctuation of symptoms with weather or environmental changes or other aggravating or alleviating factors except as outlined above   Current Medications, Allergies, Complete Past Medical History, Past Surgical History, Family History, and Social History were reviewed in [employer]  record.  ROS  The following are not active complaints unless bolded sore throat, dysphagia, dental problems, itching, sneezing,  nasal congestion or excess/ purulent secretions, ear ache,   fever, chills, sweats, unintended wt loss, classically pleuritic or exertional cp, hemoptysis,  orthopnea pnd or leg swelling, presyncope, palpitations, abdominal pain, anorexia, nausea, vomiting, diarrhea  or change in bowel or bladder habits, change in stools or urine, dysuria,hematuria,  rash, arthralgias, visual complaints, headache, numbness, weakness or ataxia or problems with walking or coordination,  change in mood/affect or memory.            Objective:   Physical Exam    amb wf nad    08/08/2015        227  06/21/15 227 lb (102.967 kg)  05/08/14 228 lb 9.9 oz (103.7 kg)  02/11/14 238 lb (107.956 kg)    Vital signs reviewed   HEENT: nl dentition, turbinates, and oropharynx. Nl external ear canals without cough reflex   NECK :  without JVD/Nodes/TM/ nl carotid upstrokes bilaterally   LUNGS: no acc muscle use,  Nl contour chest which is clear to A and P bilaterally without cough on insp or exp maneuvers   CV:  RRR  no s3 or murmur or increase in P2, no edema   ABD:  soft and nontender with nl inspiratory excursion in the supine position. No bruits or  organomegaly, bowel sounds nl  MS:  Nl gait/ ext warm without deformities, calf tenderness, cyanosis or clubbing No obvious joint restrictions   SKIN: warm and dry without lesions    NEURO:  alert, approp, nl sensorium with  no motor deficits       I personally reviewed images and agree with radiology impression as follows:  CXR:  06/09/15 Essentially stable appearance of the chest since the previous study. Minimal atelectasis or scarring persists in the lingula.         Assessment & Plan:

## 2015-08-08 NOTE — Assessment & Plan Note (Signed)
Complicated by HBP/ Hyperlipidemia/ DM / low erv on pfts 08/08/2015   Body mass index is 33.51    Lab Results  Component Value Date   TSH 2.301 05/06/2014     Contributing to gerd tendency/ doe/reviewed the need and the process to achieve and maintain neg calorie balance > defer f/u primary care including intermittently monitoring thyroid status

## 2015-08-08 NOTE — Patient Instructions (Signed)
Ok to resume fosfamax but stop immediately if worse breathing, coughing or swallowing   Your lung function is normal except for the effects of your weight on your diaphragms  Weight control is simply a matter of calorie balance which needs to be tilted in your favor by eating less and exercising more.  To get the most out of exercise, you need to be continuously aware that you are short of breath, but never out of breath, for 30 minutes daily. As you improve, it will actually be easier for you to do the same amount of exercise  in  30 minutes so always push to the level where you are short of breath.  If this does not result in gradual weight reduction then I strongly recommend you see a nutritionist with a food diary x 2 weeks so that we can work out a negative calorie balance which is universally effective in steady weight loss programs.  Think of your calorie balance like you do your bank account where in this case you want the balance to go down so you must take in less calories than you burn up.  It's just that simple:  Hard to do, but easy to understand.  Good luck!

## 2016-04-18 ENCOUNTER — Other Ambulatory Visit: Payer: Self-pay | Admitting: Orthopedic Surgery

## 2016-04-18 DIAGNOSIS — M25511 Pain in right shoulder: Secondary | ICD-10-CM

## 2016-05-04 ENCOUNTER — Ambulatory Visit
Admission: RE | Admit: 2016-05-04 | Discharge: 2016-05-04 | Disposition: A | Discharge status: Home / Self Care | Payer: Medicare Other | Source: Ambulatory Visit | Attending: Orthopedic Surgery | Admitting: Orthopedic Surgery

## 2016-05-04 DIAGNOSIS — M25511 Pain in right shoulder: Secondary | ICD-10-CM

## 2016-06-11 ENCOUNTER — Ambulatory Visit (HOSPITAL_COMMUNITY)
Admission: EM | Admit: 2016-06-11 | Discharge: 2016-06-11 | Disposition: A | Discharge status: Home / Self Care | Payer: Medicare Other | Attending: Emergency Medicine | Admitting: Emergency Medicine

## 2016-06-11 ENCOUNTER — Encounter (HOSPITAL_COMMUNITY): Payer: Self-pay | Admitting: Emergency Medicine

## 2016-06-11 DIAGNOSIS — M109 Gout, unspecified: Secondary | ICD-10-CM | POA: Diagnosis not present

## 2016-06-11 MED ORDER — PREDNISONE 10 MG PO TABS: ORAL_TABLET | ORAL | 0 refills | Status: DC

## 2016-06-11 MED ORDER — COLCHICINE 0.6 MG PO TABS: ORAL_TABLET | ORAL | 0 refills | Status: DC

## 2016-06-11 NOTE — ED Provider Notes (Signed)
CSN: [id number]     Arrival date & time 06/11/16  [id number] History   None    Chief Complaint  Patient presents with  . Foot Pain   (Consider location/radiation/quality/duration/timing/severity/associated sxs/prior Treatment) 66 year old female presents to clinic with 2 day history of left foot pain. She reports pain started at night, denies any traumatic cause, states it is painful to walk on, and she has slept with her shoes on due to the pain of the sheets touching her toes. She has not had any previous episodes similar to this one. She denies systemic symptoms such as fever, or nausea.   The history is provided by the patient.  Foot Pain     Past Medical History:  Diagnosis Date  . Anxiety   . Arthritis   . Back problem   . Chronic kidney disease   . Complication of anesthesia    wakes up durning surgery  . Depression   . Diabetes mellitus without complication (HCC)   . Dizziness   . GERD (gastroesophageal reflux disease)   . Hyperlipidemia   . Hypertension   . Pneumonia   . Seasonal allergies   . Shortness of breath dyspnea   . Sleep apnea    Past Surgical History:  Procedure Laterality Date  . BACK SURGERY    . KYPHOPLASTY N/A 08/12/2012   Procedure: KYPHOPLASTY;  Surgeon: Clydene FakeJames R Hirsch, MD;  Location: [facility];  Service: Neurosurgery;  Laterality: N/A;  L1 Balloon Kyphoplasty  . NASAL SINUS SURGERY    . TONSILLECTOMY    . TUBAL LIGATION     Family History  Problem Relation Age of Onset  . Heart disease    . Arthritis     Social History  Substance Use Topics  . Smoking status: Former Smoker    Packs/day: 0.75    Years: 30.00    Types: Cigarettes    Quit date: 05/14/2009  . Smokeless tobacco: Never Used  . Alcohol use 29.4 oz/week    14 Standard drinks or equivalent, 35 Shots of liquor per week   OB History    No data available     Review of Systems  Reason unable to perform ROS: as covered in HPI.  All other systems reviewed and are  negative.   Allergies  Lisinopril; Dilaudid [hydromorphone hcl]; Fosamax [alendronate sodium]; Levaquin leva-pak [levofloxacin]; Norvasc [amlodipine besylate]; Sudafed 12 hour [pseudoephedrine hcl er]; Sudafed [pseudoephedrine hcl]; Sulfa antibiotics; Acetaminophen; Macrodantin [nitrofurantoin macrocrystal]; Macrodantin [nitrofurantoin]; and Penicillins  Home Medications   Prior to Admission medications   Medication Sig Start Date End Date Taking? Authorizing Provider  atorvastatin (LIPITOR) 40 MG tablet Take 40 mg by mouth daily.   Yes Historical Provider, MD  cetirizine (ZYRTEC) 10 MG tablet Take 10 mg by mouth daily.   Yes Historical Provider, MD  cholecalciferol (VITAMIN D) 1000 UNITS tablet Take 1,000 Units by mouth daily.   Yes Historical Provider, MD  FLUoxetine (PROZAC) 40 MG capsule Take 40 mg by mouth daily.   Yes Historical Provider, MD  fluticasone (FLONASE) 50 MCG/ACT nasal spray Place 2 sprays into both nostrils daily. 02/12/14  Yes Joseph ArtJessica U Vann, DO  hydrochlorothiazide (HYDRODIURIL) 25 MG tablet Take 25 mg by mouth daily. 01/28/14  Yes Historical Provider, MD  metFORMIN (GLUCOPHAGE) 500 MG tablet Take 500 mg by mouth at bedtime.    Yes Historical Provider, MD  Multiple Vitamin (MULTIVITAMIN WITH MINERALS) TABS Take 1 tablet by mouth daily.   Yes Historical Provider, MD  colchicine 0.6 MG tablet Take two tablets now, wait 1 hour, if still in pain, take the second 06/11/16   Dorena Bodo, NP  predniSONE (DELTASONE) 10 MG tablet Take 2 tablets twice a day for 6 days. 06/11/16   Dorena Bodo, NP   Meds Ordered and Administered this Visit  Medications - No data to display  BP 130/88 (BP Location: Right Arm)   Pulse 95   Temp 97.9 F (36.6 C) (Oral)   Resp 20   SpO2 97%  No data found.   Physical Exam  Constitutional: She is oriented to person, place, and time. She appears well-developed and well-nourished. No distress.  Musculoskeletal: Normal range of motion. She  exhibits edema and tenderness.       Feet:  Neurological: She is alert and oriented to person, place, and time.  Skin: Skin is warm and dry. Capillary refill takes less than 2 seconds. She is not diaphoretic. There is erythema.  Psychiatric: She has a normal mood and affect.  Nursing note and vitals reviewed.   Urgent Care Course     Procedures (including critical care time)  Labs Review Labs Reviewed - No data to display  Imaging Review No results found.   Visual Acuity Review  Right Eye Distance:   Left Eye Distance:   Bilateral Distance:    Right Eye Near:   Left Eye Near:    Bilateral Near:         MDM   1. Acute gout involving toe of left foot, unspecified cause   Based on your symptoms, I believe you most likely have gout. I have sent two medicines to your pharmacy. The first is colchicine, take two tablets now, then a third in 1 hour if needed. Due to your inability to take NSAIDS like aspirin and ibuprofen, I have prescribed prednisone, take two tablets twice a day for 6 days. Should your symptoms continue, follow up with your primary care provider as needed or return to clinic.      Dorena Bodo, NP 06/11/16 1029

## 2016-06-11 NOTE — Discharge Instructions (Signed)
Based on your symptoms, I believe you most likely have gout. I have sent two medicines to your pharmacy. The first is colchicine, take two tablets now, then a third in 1 hour if needed. Due to your inability to take NSAIDS like aspirin and ibuprofen, I have prescribed prednisone, take two tablets twice a day for 6 days. Should your symptoms continue, follow up with your primary care provider as needed or return to clinic.

## 2016-06-11 NOTE — ED Triage Notes (Signed)
[facility] a [facility] of left foot pain that started yesterday. The patient denied any known injury.

## 2016-09-19 IMAGING — US US ABDOMEN LIMITED
1 series · 14 of 25 positions shown · non-contrast
Comparison: Abdominal and pelvic CT scan without contrast dated
February 07, 2014.

CLINICAL DATA: Abdominal distension for 2 months without nausea or
vomiting or abdominal pain, history of hyperlipidemia

EXAM:
US ABDOMEN LIMITED - RIGHT UPPER QUADRANT

[Series 1: us abdomen limited · 0.32mm/px · 14 of 65 slices shown]
[im 1/65]
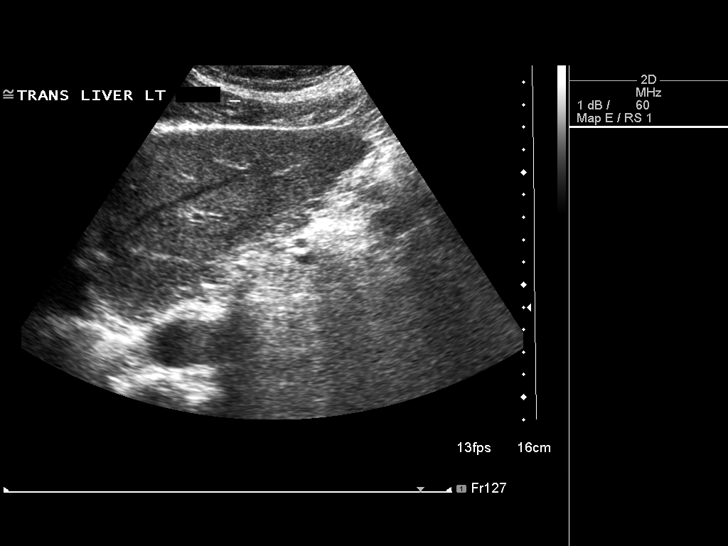
[im 6/65]
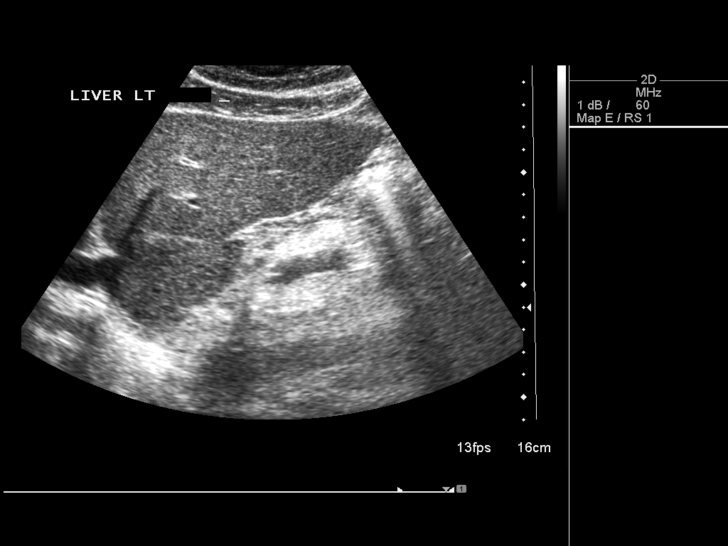
[im 11/65]
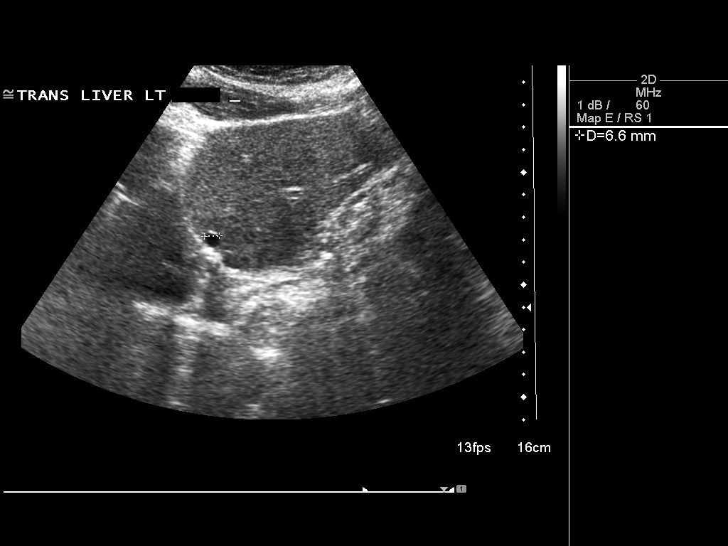
[im 17/65]
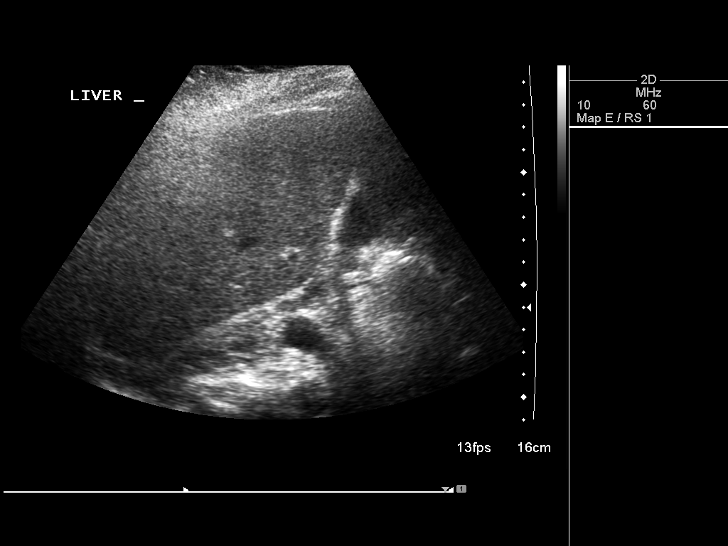
[im 22/65]
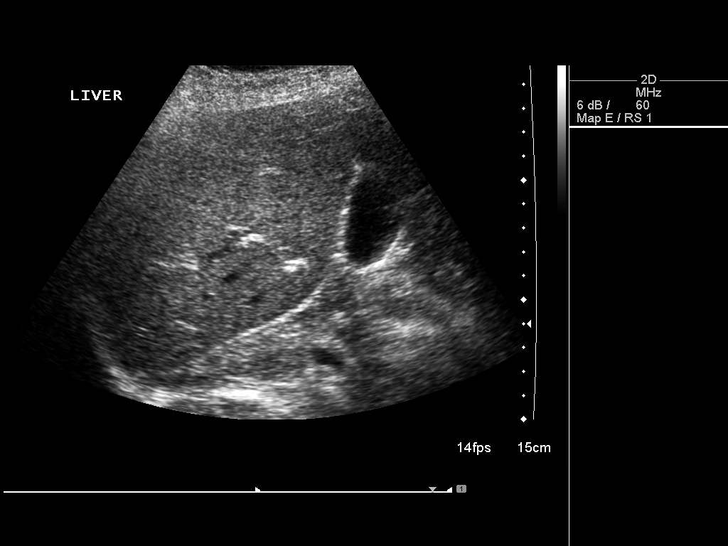
[im 25/65]
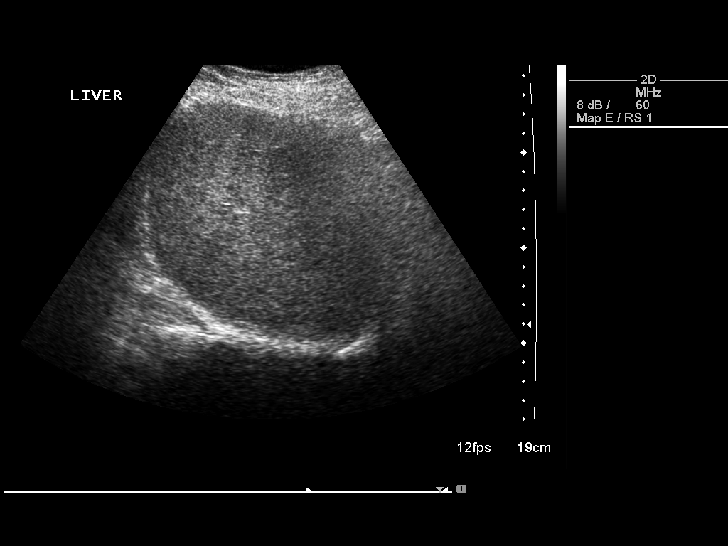
[im 30/65]
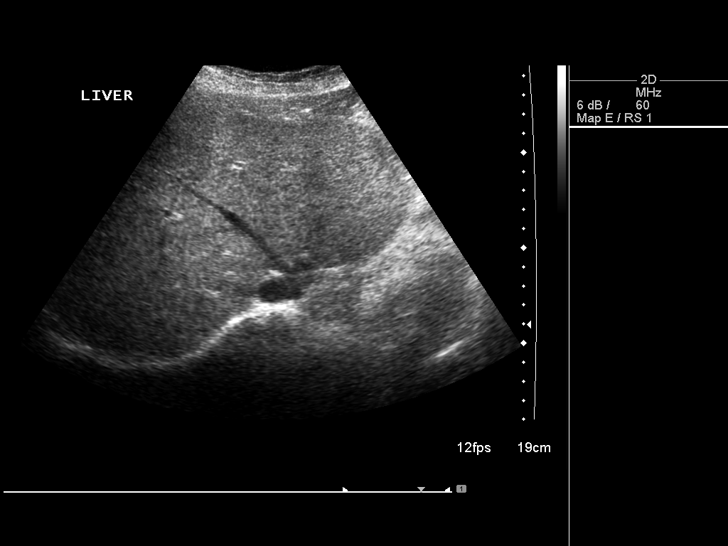
[im 35/65]
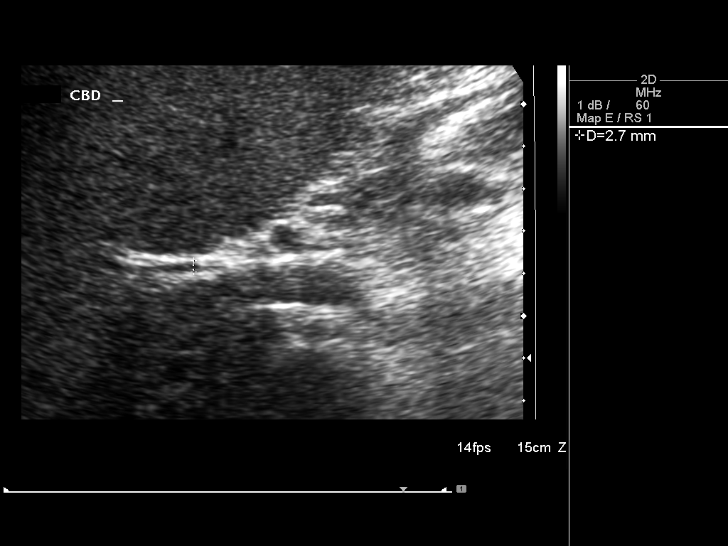
[im 41/65]
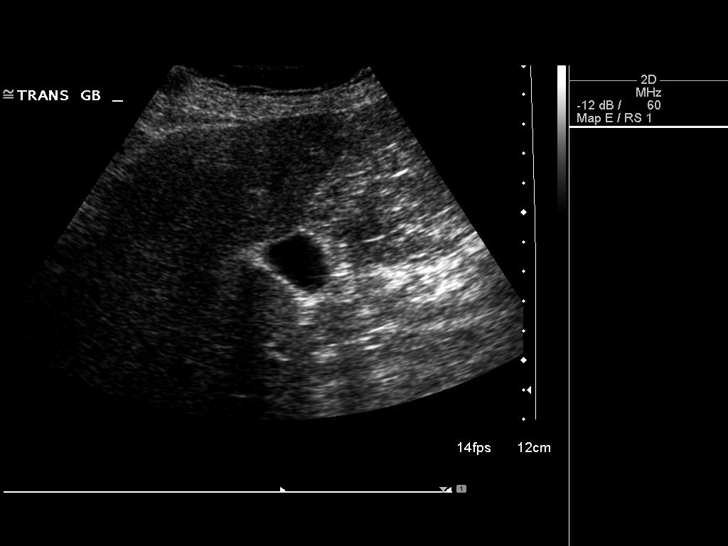
[im 43/65]
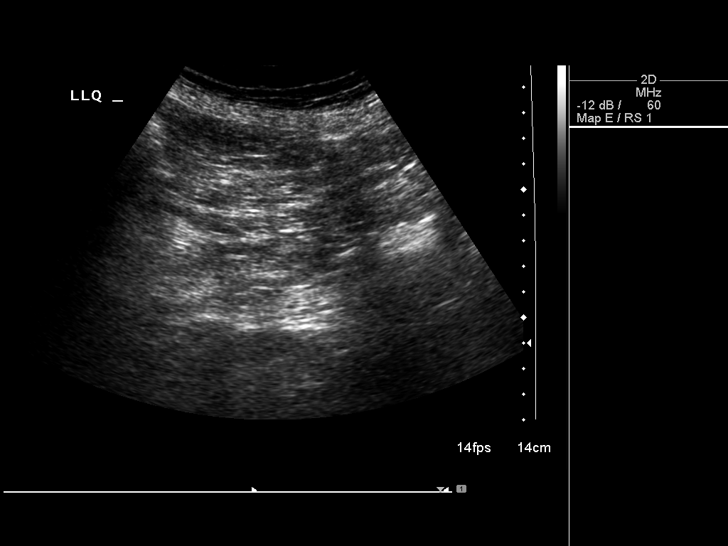
[im 49/65]
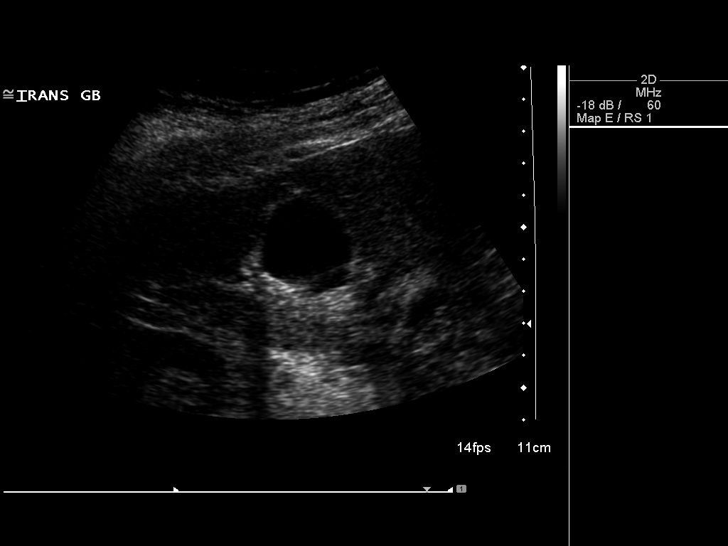
[im 54/65]
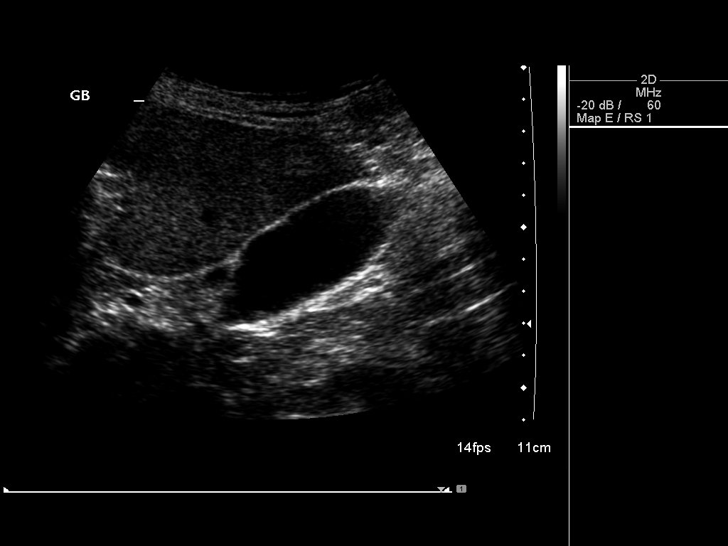
[im 59/65]
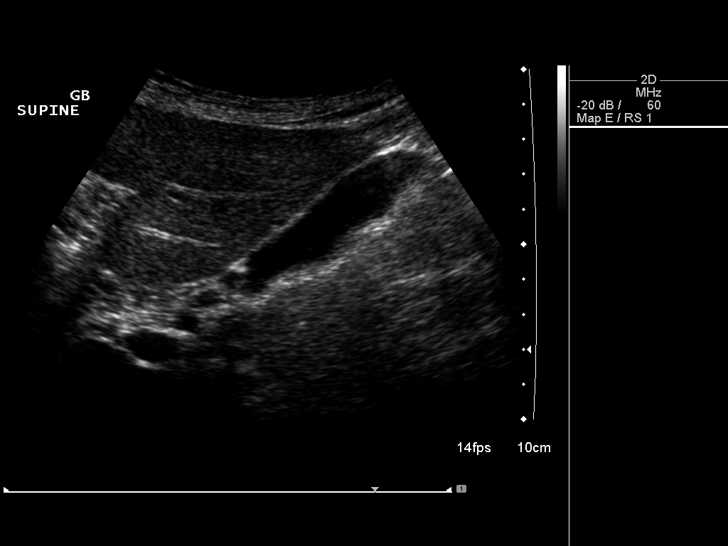
[im 65/65]
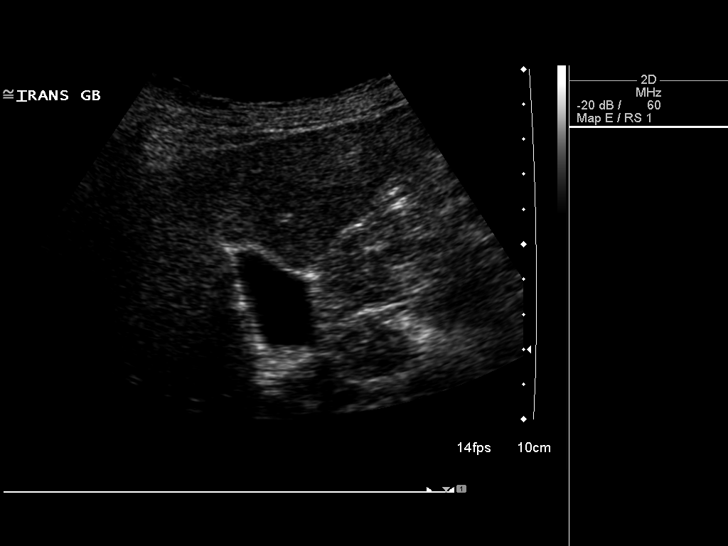

[14 of 25 positions shown; findings below may reference images not displayed]

FINDINGS: Gallbladder:

The gallbladder is adequately distended. There is echogenic
intraluminal material consistent with sludge. Similar findings were
noted on the earlier CT scan. No discrete stones are demonstrated.
There is no gallbladder wall thickening, pericholecystic fluid hot,
or positive sonographic Murphy's sign.

Common bile duct:

Diameter: 2.7 mm

Liver:

There is a 7 mm diameter cyst just inferior to the capsule in the
left hepatic lobe adjacent to the diaphragm. Within normal limits in
parenchymal echogenicity. There is no intrahepatic ductal dilation.
The parenchymal echotexture of the liver is normal.

No ascites is demonstrated.
IMPRESSION: 1. Sludge but no stones demonstrated in the gallbladder. There is no
sonographic evidence of acute cholecystitis.
2. The liver and common bile duct are unremarkable.

## 2016-09-25 ENCOUNTER — Encounter (HOSPITAL_COMMUNITY): Payer: Self-pay | Admitting: Emergency Medicine

## 2016-09-25 ENCOUNTER — Emergency Department (HOSPITAL_COMMUNITY): Payer: Medicare Other

## 2016-09-25 ENCOUNTER — Emergency Department (HOSPITAL_COMMUNITY)
Admission: EM | Admit: 2016-09-25 | Discharge: 2016-09-25 | Disposition: A | Discharge status: Home / Self Care | Payer: Medicare Other | Attending: Emergency Medicine | Admitting: Emergency Medicine

## 2016-09-25 DIAGNOSIS — Z7984 Long term (current) use of oral hypoglycemic drugs: Secondary | ICD-10-CM | POA: Diagnosis not present

## 2016-09-25 DIAGNOSIS — E1165 Type 2 diabetes mellitus with hyperglycemia: Secondary | ICD-10-CM | POA: Diagnosis present

## 2016-09-25 DIAGNOSIS — I10 Essential (primary) hypertension: Secondary | ICD-10-CM | POA: Diagnosis not present

## 2016-09-25 DIAGNOSIS — Z79899 Other long term (current) drug therapy: Secondary | ICD-10-CM | POA: Insufficient documentation

## 2016-09-25 DIAGNOSIS — Z87891 Personal history of nicotine dependence: Secondary | ICD-10-CM | POA: Insufficient documentation

## 2016-09-25 DIAGNOSIS — R739 Hyperglycemia, unspecified: Secondary | ICD-10-CM

## 2016-09-25 LAB — URINALYSIS, ROUTINE W REFLEX MICROSCOPIC
BILIRUBIN URINE: NEGATIVE
KETONES UR: NEGATIVE mg/dL
LEUKOCYTES UA: NEGATIVE
NITRITE: NEGATIVE
PH: 5 (ref 5.0–8.0)
Protein, ur: NEGATIVE mg/dL
Specific Gravity, Urine: 1.014 (ref 1.005–1.030)

## 2016-09-25 LAB — BASIC METABOLIC PANEL
ANION GAP: 12 (ref 5–15)
BUN: 17 mg/dL (ref 6–20)
CALCIUM: 10 mg/dL (ref 8.9–10.3)
CO2: 22 mmol/L (ref 22–32)
CREATININE: 1.08 mg/dL — AB (ref 0.44–1.00)
Chloride: 96 mmol/L — ABNORMAL LOW (ref 101–111)
GFR, EST NON AFRICAN AMERICAN: 52 mL/min — AB (ref >60)
Glucose, Bld: 409 mg/dL — ABNORMAL HIGH (ref 65–99)
Potassium: 3.8 mmol/L (ref 3.5–5.1)
SODIUM: 130 mmol/L — AB (ref 135–145)

## 2016-09-25 LAB — CBC
HCT: 41.3 % (ref 36.0–46.0)
Hemoglobin: 14 g/dL (ref 12.0–15.0)
MCH: 32.9 pg (ref 26.0–34.0)
MCHC: 33.9 g/dL (ref 30.0–36.0)
MCV: 96.9 fL (ref 78.0–100.0)
PLATELETS: 228 10*3/uL (ref 150–400)
RBC: 4.26 MIL/uL (ref 3.87–5.11)
RDW: 12.2 % (ref 11.5–15.5)
WBC: 8.7 10*3/uL (ref 4.0–10.5)

## 2016-09-25 LAB — CBG MONITORING, ED
Glucose-Capillary: 419 mg/dL — ABNORMAL HIGH (ref 65–99)
Glucose-Capillary: 298 mg/dL — ABNORMAL HIGH (ref 65–99)

## 2016-09-25 MED ORDER — GI COCKTAIL ~~LOC~~
30.0000 mL | Freq: Once | ORAL | Status: AC
  Administered 2016-09-25: 30 mL via ORAL
  Filled 2016-09-25: qty 30

## 2016-09-25 MED ORDER — SODIUM CHLORIDE 0.9 % IV BOLUS (SEPSIS)
1000.0000 mL | Freq: Once | INTRAVENOUS | Status: AC
  Administered 2016-09-25: 1000 mL via INTRAVENOUS

## 2016-09-25 MED ORDER — INSULIN ASPART 100 UNIT/ML ~~LOC~~ SOLN
8.0000 [IU] | Freq: Once | SUBCUTANEOUS | Status: AC
  Administered 2016-09-25: 8 [IU] via SUBCUTANEOUS
  Filled 2016-09-25: qty 1

## 2016-09-25 NOTE — ED Notes (Signed)
PA at bedside at this time.  

## 2016-09-25 NOTE — ED Notes (Signed)
Pt taken to xray 

## 2016-09-25 NOTE — ED Provider Notes (Signed)
Medical screening examination/treatment/procedure(s) were conducted as a shared visit with non-physician practitioner(s) and myself.  I personally evaluated the patient during the encounter. Briefly, the patient is a 66 y.o. female with a history of diabetes presents to the ED with hypoglycemia. Labs without evidence of DKA or HHS. Patient provided with fluids and subcutaneous insulin with improvement blood sugars. Patient is currently asymptomatic. She has close PCP follow-up in 2 days. Is safe for discharge with strict return precautions.Nira Conn.     Lizania Bouchard Eduardo, MD 09/25/16 2055

## 2016-09-25 NOTE — Discharge Instructions (Signed)
Continue metformin, you can start taking it twice a day. Follow up with family doctor as scheduled. Watch your diet closely.

## 2016-09-25 NOTE — ED Provider Notes (Signed)
[facility]-EMERGENCY DEPT Provider Note   CSN: [id number] Arrival date & time: 09/25/16  1440     History   Chief Complaint Chief Complaint  Patient presents with  . Hyperglycemia    HPI Paulo FruitMalona Horton is a 66 y.o. female.  HPI Paulo FruitMalona Mcnair is a 66 y.o. female with a history of diabetes, hypertension, GERD, hyperlipidemia, anxiety, presents to emergency department complaining of elevated blood sugar. Patient states she was due to have blood work done today for a routine physical, and was called back and told that her blood sugar is over 400. She is told to report immediately to the hospital. Patient states that she has had elevated blood sugars over the last 2 months and 400s. She states that she has been feeling intermittent dizziness over the last several months. She denies any polyuria or polydipsia. Denies any pain anywhere. No recent illnesses. No urinary symptoms. Patient admits that her sugar has not been controlled well over the last half a year. She states that she has been working on improving her diet but still admits to eating a lot of carbohydrates. She has no other complaints today.  Past Medical History:  Diagnosis Date  . Anxiety   . Arthritis   . Back problem   . Chronic kidney disease   . Complication of anesthesia    wakes up durning surgery  . Depression   . Diabetes mellitus without complication (HCC)   . Dizziness   . GERD (gastroesophageal reflux disease)   . Hyperlipidemia   . Hypertension   . Pneumonia   . Seasonal allergies   . Shortness of breath dyspnea   . Sleep apnea     Patient Active Problem List   Diagnosis Date Noted  . Upper airway cough syndrome 06/21/2015  . Acute encephalopathy 05/10/2014  . Alcohol withdrawal delirium (HCC) 05/10/2014  . Difficult intubation   . Acute respiratory failure with hypoxia (HCC)   . Angioedema 05/06/2014  . Goiter 05/06/2014  . Hypertension 05/06/2014  . Acute sinusitis 02/10/2014  . Nausea with  vomiting 02/10/2014  . 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 Renal insufficiency 02/07/2014  . Acute renal failure (HCC) 02/07/2014  . Enteritis due to Clostridium difficile 02/07/2014  . Hypotension 02/07/2014  . Benign hypertension 02/07/2014  . Obesity, morbid (HCC) 02/07/2014  . GERD (gastroesophageal reflux disease) 02/07/2014  . Hyperlipidemia 02/07/2014  . Sciatica 06/10/2012  . Lumbar compression fracture (HCC) 06/10/2012    Past Surgical History:  Procedure Laterality Date  . BACK SURGERY    . KYPHOPLASTY N/A 08/12/2012   Procedure: KYPHOPLASTY;  Surgeon: Clydene FakeJames R Hirsch, MD;  Location: [facility];  Service: Neurosurgery;  Laterality: N/A;  L1 Balloon Kyphoplasty  . NASAL SINUS SURGERY    . TONSILLECTOMY    . TUBAL LIGATION      OB History    No data available       Home Medications    Prior to Admission medications   Medication Sig Start Date End Date Taking? Authorizing Provider  atorvastatin (LIPITOR) 40 MG tablet Take 40 mg by mouth daily.    [provider]  cetirizine (ZYRTEC) 10 MG tablet Take 10 mg by mouth daily.    [provider]  cholecalciferol (VITAMIN D) 1000 UNITS tablet Take 1,000 Units by mouth daily.    [provider]  colchicine 0.6 MG tablet Take two tablets now, wait 1 hour, if still in pain, take the second 06/11/16  Dorena Bodo, NP  FLUoxetine (PROZAC) 40 MG capsule Take 40 mg by mouth daily.    [provider]  fluticasone (FLONASE) 50 MCG/ACT nasal spray Place 2 sprays into both nostrils daily. 02/12/14   Joseph Art, DO  hydrochlorothiazide (HYDRODIURIL) 25 MG tablet Take 25 mg by mouth daily. 01/28/14   [provider]  metFORMIN (GLUCOPHAGE) 500 MG tablet Take 500 mg by mouth at bedtime.     [provider]  Multiple Vitamin (MULTIVITAMIN WITH MINERALS) TABS Take 1 tablet by mouth daily.    [provider]  predniSONE (DELTASONE)  10 MG tablet Take 2 tablets twice a day for 6 days. 06/11/16   Dorena Bodo, NP    Family History Family History  Problem Relation Age of Onset  . Heart disease Unknown   . Arthritis Unknown     Social History Social History  Substance Use Topics  . Smoking status: Former Smoker    Packs/day: 0.75    Years: 30.00    Types: Cigarettes    Quit date: 05/14/2009  . Smokeless tobacco: Never Used  . Alcohol use 29.4 oz/week    14 Standard drinks or equivalent, 35 Shots of liquor per week     Allergies   Lisinopril; Dilaudid [hydromorphone hcl]; Fosamax [alendronate sodium]; Levaquin leva-pak [levofloxacin]; Norvasc [amlodipine besylate]; Sudafed 12 hour [pseudoephedrine hcl er]; Sudafed [pseudoephedrine hcl]; Sulfa antibiotics; Acetaminophen; Macrodantin [nitrofurantoin macrocrystal]; Macrodantin [nitrofurantoin]; and Penicillins   Review of Systems Review of Systems  Constitutional: Negative for chills and fever.  Respiratory: Negative for cough, chest tightness and shortness of breath.   Cardiovascular: Negative for chest pain, palpitations and leg swelling.  Gastrointestinal: Negative for abdominal pain, diarrhea, nausea and vomiting.  Genitourinary: Negative for dysuria, flank pain and pelvic pain.  Musculoskeletal: Negative for arthralgias, myalgias, neck pain and neck stiffness.  Skin: Negative for rash.  Neurological: Positive for dizziness and light-headedness. Negative for weakness and headaches.  All other systems reviewed and are negative.    Physical Exam Updated Vital Signs BP 128/89   Pulse 88   Temp 97.7 F (36.5 C) (Oral)   Resp 16   SpO2 95%   Physical Exam  Constitutional: She is oriented to person, place, and time. She appears well-developed and well-nourished. No distress.  HENT:  Head: Normocephalic.  Eyes: Conjunctivae are normal.  Neck: Neck supple.  Cardiovascular: Normal rate, regular rhythm and normal heart sounds.   Pulmonary/Chest:  Effort normal and breath sounds normal. No respiratory distress. She has no wheezes. She has no rales.  Abdominal: Soft. Bowel sounds are normal. She exhibits no distension. There is no tenderness. There is no rebound.  Musculoskeletal: She exhibits no edema.  Neurological: She is alert and oriented to person, place, and time. She displays normal reflexes. No cranial nerve deficit. Coordination normal.  Skin: Skin is warm and dry.  Psychiatric: She has a normal mood and affect. Her behavior is normal.  Nursing note and vitals reviewed.    ED Treatments / Results  Labs (all labs ordered are listed, but only abnormal results are displayed) Labs Reviewed  BASIC METABOLIC PANEL - Abnormal; Notable for the following:       Result Value   Sodium 130 (*)    Chloride 96 (*)    Glucose, Bld 409 (*)    Creatinine, Ser 1.08 (*)    GFR calc non Af Amer 52 (*)    All other components within normal limits  URINALYSIS, ROUTINE W REFLEX MICROSCOPIC -  Abnormal; Notable for the following:    APPearance HAZY (*)    Glucose, UA >=500 (*)    Hgb urine dipstick SMALL (*)    Bacteria, UA RARE (*)    Squamous Epithelial / LPF 0-5 (*)    All other components within normal limits  CBG MONITORING, ED - Abnormal; Notable for the following:    Glucose-Capillary 419 (*)    All other components within normal limits  CBG MONITORING, ED - Abnormal; Notable for the following:    Glucose-Capillary 298 (*)    All other components within normal limits  CBC    EKG  EKG Interpretation None       Radiology Dg Chest 2 View  Result Date: 09/25/2016 CLINICAL DATA:  Hyperglycemia today. Dizziness, blurry vision, and SOB today. Fatigue x1 week. Hx of HTN, DM, GERD, anxiety, CKD. EXAM: CHEST  2 VIEW COMPARISON:  Chest x-ray dated 06/09/2015. FINDINGS: Heart size and mediastinal contours are stable. Lungs are clear. No pleural effusion or pneumothorax seen. No acute or suspicious osseous finding. IMPRESSION: No  active cardiopulmonary disease. No evidence of pneumonia or pulmonary edema. Electronically Signed   By: Bary Richard M.D.   On: 09/25/2016 20:24    Procedures Procedures (including critical care time)  Medications Ordered in ED Medications  sodium chloride 0.9 % bolus 1,000 mL (1,000 mLs Intravenous New Bag/Given 09/25/16 1915)  sodium chloride 0.9 % bolus 1,000 mL (1,000 mLs Intravenous New Bag/Given 09/25/16 1915)  gi cocktail (Maalox,Lidocaine,Donnatal) (30 mLs Oral Given 09/25/16 1915)  insulin aspart (novoLOG) injection 8 Units (8 Units Subcutaneous Given 09/25/16 1924)     Initial Impression / Assessment and Plan / ED Course  I have reviewed the triage vital signs and the nursing notes.  Pertinent labs & imaging results that were available during my care of the patient were reviewed by me and considered in my medical decision making (see chart for details).   [facility] emergency department with elevated blood sugar. She admitted to me that she has had sugars over 400 over last several months. She reports intermittent dizziness, otherwise no complaints. We will check labs, urinalysis, chest x-ray, will administer IV fluids and insulin to lower blood sugar.    Blood work showing sugar of 409, normal anion gap. Patient received 8 units of insulin and 2 L of IV fluids. Will recheck CBG  Recheck CBG is 298. Pt has PCP apt in 2 days. Advised to start metformin twice a day, discuss further plan with PCP. Discussed diet changes. Return precautions discussed.   Vitals:   09/25/16 1900 09/25/16 1930 09/25/16 2030 09/25/16 2108  BP: 128/85 128/89 106/62 110/70  Pulse: 89 88 83 87  Resp:  16  16  Temp:      TempSrc:      SpO2: 95% 95% 93% 95%      Final diagnoses:  Hyperglycemia    New Prescriptions Discharge Medication List as of 09/25/2016  9:03 PM       Jaynie Crumble, PA-C 09/26/16 0049

## 2016-09-25 NOTE — ED Triage Notes (Signed)
Pt sent here for hyperglycemia; pt sts not feeling well x 2 days

## 2017-03-24 ENCOUNTER — Emergency Department (HOSPITAL_COMMUNITY): Payer: Medicare Other

## 2017-03-24 ENCOUNTER — Encounter (HOSPITAL_COMMUNITY): Payer: Self-pay | Admitting: Emergency Medicine

## 2017-03-24 ENCOUNTER — Emergency Department (HOSPITAL_COMMUNITY)
Admission: EM | Admit: 2017-03-24 | Discharge: 2017-03-24 | Disposition: A | Discharge status: Home / Self Care | Payer: Medicare Other | Attending: Emergency Medicine | Admitting: Emergency Medicine

## 2017-03-24 ENCOUNTER — Other Ambulatory Visit: Payer: Self-pay

## 2017-03-24 DIAGNOSIS — S40011A Contusion of right shoulder, initial encounter: Secondary | ICD-10-CM | POA: Diagnosis not present

## 2017-03-24 DIAGNOSIS — N189 Chronic kidney disease, unspecified: Secondary | ICD-10-CM | POA: Insufficient documentation

## 2017-03-24 DIAGNOSIS — W19XXXA Unspecified fall, initial encounter: Secondary | ICD-10-CM

## 2017-03-24 DIAGNOSIS — Y999 Unspecified external cause status: Secondary | ICD-10-CM | POA: Insufficient documentation

## 2017-03-24 DIAGNOSIS — Y929 Unspecified place or not applicable: Secondary | ICD-10-CM | POA: Diagnosis not present

## 2017-03-24 DIAGNOSIS — S4991XA Unspecified injury of right shoulder and upper arm, initial encounter: Secondary | ICD-10-CM | POA: Diagnosis present

## 2017-03-24 DIAGNOSIS — W010XXA Fall on same level from slipping, tripping and stumbling without subsequent striking against object, initial encounter: Secondary | ICD-10-CM | POA: Insufficient documentation

## 2017-03-24 DIAGNOSIS — Z79899 Other long term (current) drug therapy: Secondary | ICD-10-CM | POA: Insufficient documentation

## 2017-03-24 DIAGNOSIS — Y93E2 Activity, laundry: Secondary | ICD-10-CM | POA: Insufficient documentation

## 2017-03-24 DIAGNOSIS — E1122 Type 2 diabetes mellitus with diabetic chronic kidney disease: Secondary | ICD-10-CM | POA: Insufficient documentation

## 2017-03-24 DIAGNOSIS — Z87891 Personal history of nicotine dependence: Secondary | ICD-10-CM | POA: Insufficient documentation

## 2017-03-24 DIAGNOSIS — S20211A Contusion of right front wall of thorax, initial encounter: Secondary | ICD-10-CM | POA: Insufficient documentation

## 2017-03-24 DIAGNOSIS — I129 Hypertensive chronic kidney disease with stage 1 through stage 4 chronic kidney disease, or unspecified chronic kidney disease: Secondary | ICD-10-CM | POA: Insufficient documentation

## 2017-03-24 MED ORDER — HYDROCODONE-ACETAMINOPHEN 5-325 MG PO TABS
1.0000 | ORAL_TABLET | Freq: Once | ORAL | Status: AC
  Administered 2017-03-24: 1 via ORAL
  Filled 2017-03-24: qty 1

## 2017-03-24 MED ORDER — IBUPROFEN 400 MG PO TABS
400.0000 mg | ORAL_TABLET | Freq: Once | ORAL | Status: AC
  Administered 2017-03-24: 400 mg via ORAL
  Filled 2017-03-24: qty 1

## 2017-03-24 MED ORDER — HYDROCODONE-ACETAMINOPHEN 5-325 MG PO TABS: 1.0000 | ORAL_TABLET | Freq: Four times a day (QID) | ORAL | 0 refills | Status: DC | PRN

## 2017-03-24 NOTE — ED Provider Notes (Signed)
[facility] EMERGENCY DEPARTMENT Provider Note   CSN: [id number] Arrival date & time: 03/24/17  0703     History   Chief Complaint Chief Complaint  Patient presents with  . Fall  . Chest Pain  . Shoulder Pain    HPI Ariel Meyer is a 66 y.o. female.  Patient is a 66 year old female with a history of chronic kidney disease, diabetes, GERD, hypertension presented today after a fall on Thursday evening.  Patient states that she was carrying the laundry basket and tripped over the dog falling forward on her right side onto the laundry basket.  With over-the-counter medications and some leftover tramadol she was using at home the pain seemed to be improving yesterday but was worse this morning.  Pain is an 8 out of 10 and significantly worse with breathing and movement.  She denies any shortness of breath.   The history is provided by the patient.  Fall  This is a new problem. The current episode started more than 2 days ago. The problem occurs constantly. The problem has been gradually worsening. Associated symptoms include chest pain. Pertinent negatives include no abdominal pain, no headaches and no shortness of breath. Associated symptoms comments: Right shoulder and rib pain. No head injury or LOC.  No pain in the legs. The symptoms are aggravated by bending and twisting (deep breathing and position changes). Relieved by: Some improvement with some tramadol she has left at home, ibuprofen and Tylenol. The treatment provided no relief.  Chest Pain   Pertinent negatives include no abdominal pain, no headaches and no shortness of breath.  Shoulder Pain      Past Medical History:  Diagnosis Date  . Anxiety   . Arthritis   . Back problem   . Chronic kidney disease   . Complication of anesthesia    wakes up durning surgery  . Depression   . Diabetes mellitus without complication (HCC)   . Dizziness   . GERD (gastroesophageal reflux disease)   .  Hyperlipidemia   . Hypertension   . Pneumonia   . Seasonal allergies   . Shortness of breath dyspnea   . Sleep apnea     Patient Active Problem List   Diagnosis Date Noted  . Upper airway cough syndrome 06/21/2015  . Acute encephalopathy 05/10/2014  . Alcohol withdrawal delirium (HCC) 05/10/2014  . Difficult intubation   . Acute respiratory failure with hypoxia (HCC)   . Angioedema 05/06/2014  . Goiter 05/06/2014  . Hypertension 05/06/2014  . Acute sinusitis 02/10/2014  . Nausea with vomiting 02/10/2014  . 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 Renal insufficiency 02/07/2014  . Acute renal failure (HCC) 02/07/2014  . Enteritis due to Clostridium difficile 02/07/2014  . Hypotension 02/07/2014  . Benign hypertension 02/07/2014  . Obesity, morbid (HCC) 02/07/2014  . GERD (gastroesophageal reflux disease) 02/07/2014  . Hyperlipidemia 02/07/2014  . Sciatica 06/10/2012  . Lumbar compression fracture (HCC) 06/10/2012    Past Surgical History:  Procedure Laterality Date  . BACK SURGERY    . NASAL SINUS SURGERY    . TONSILLECTOMY    . TUBAL LIGATION      OB History    No data available       Home Medications    Prior to Admission medications   Medication Sig Start Date End Date Taking? Authorizing Provider  acetaminophen (TYLENOL) 500 MG tablet Take 500 mg 2 (two) times daily by mouth.   Yes  [provider]  cholecalciferol (VITAMIN D) 1000 UNITS tablet Take 1,000 Units by mouth daily.   Yes [provider]  famotidine (PEPCID) 10 MG tablet Take 10 mg 2 (two) times daily as needed by mouth for heartburn or indigestion.   Yes [provider]  FLUoxetine (PROZAC) 40 MG capsule Take 40 mg by mouth daily.   Yes [provider]  fluticasone (FLONASE) 50 MCG/ACT nasal spray Place 2 sprays into both nostrils daily. 02/12/14  Yes Vann, Jessica U, DO  gabapentin (NEURONTIN) 100 MG capsule Take 100-300 mg at  bedtime as needed by mouth (pain).   Yes [provider]  Multiple Vitamin (MULTIVITAMIN WITH MINERALS) TABS Take 1 tablet by mouth daily.   Yes [provider]  omeprazole (PRILOSEC) 20 MG capsule Take 20 mg daily by mouth.   Yes [provider]    Family History Family History  Problem Relation Age of Onset  . Heart disease Unknown   . Arthritis Unknown     Social History Social History   Tobacco Use  . Smoking status: Former Smoker    Packs/day: 0.75    Years: 30.00    Pack years: 22.50    Types: Cigarettes    Last attempt to quit: 05/14/2009    Years since quitting: 7.8  . Smokeless tobacco: Never Used  Substance Use Topics  . Alcohol use: Yes    Alcohol/week: 29.4 oz    Types: 35 Shots of liquor, 14 Standard drinks or equivalent per week  . Drug use: No     Allergies   Lisinopril; Dilaudid [hydromorphone hcl]; Fosamax [alendronate sodium]; Levaquin leva-pak [levofloxacin]; Norvasc [amlodipine besylate]; Sudafed 12 hour [pseudoephedrine hcl er]; Sudafed [pseudoephedrine hcl]; Sulfa antibiotics; Macrodantin [nitrofurantoin macrocrystal]; Macrodantin [nitrofurantoin]; and Penicillins   Review of Systems Review of Systems  Respiratory: Negative for shortness of breath.   Cardiovascular: Positive for chest pain.  Gastrointestinal: Negative for abdominal pain.  Neurological: Negative for headaches.  All other systems reviewed and are negative.    Physical Exam Updated Vital Signs BP 112/90 (BP Location: Left Arm)   Pulse (!) 102   Temp 98.1 F (36.7 C) (Oral)   Resp (!) 23   Ht 5\' 9"  (1.753 m)   Wt 97.5 kg (215 lb)   SpO2 93%   BMI 31.75 kg/m   Physical Exam  Constitutional: She is oriented to person, place, and time. She appears well-developed and well-nourished. No distress.  HENT:  Head: Normocephalic and atraumatic.  Mouth/Throat: Oropharynx is clear and moist.  Eyes: Conjunctivae and EOM are normal. Pupils are equal, round,  and reactive to light.  Neck: Normal range of motion. Neck supple. No spinous process tenderness and no muscular tenderness present. Normal range of motion present.  Cardiovascular: Regular rhythm and intact distal pulses. Tachycardia present.  No murmur heard. Pulmonary/Chest: Effort normal and breath sounds normal. No respiratory distress. She has no decreased breath sounds. She has no wheezes. She has no rales. She exhibits tenderness and bony tenderness. She exhibits no crepitus.    Abdominal: Soft. She exhibits no distension. There is no tenderness. There is no rebound and no guarding.  Musculoskeletal: She exhibits no edema.       Right shoulder: She exhibits decreased range of motion, tenderness and bony tenderness.       Arms: Neurological: She is alert and oriented to person, place, and time.  Skin: Skin is warm and dry. No rash noted. No erythema.  Psychiatric: She has a  normal mood and affect. Her behavior is normal.  Nursing note and vitals reviewed.    ED Treatments / Results  Labs (all labs ordered are listed, but only abnormal results are displayed) Labs Reviewed - No data to display  EKG  EKG Interpretation None       Radiology Dg Ribs Unilateral W/chest Right  Result Date: 03/24/2017 CLINICAL DATA:  Post fall, now with right rib pain. EXAM: RIGHT RIBS AND CHEST - 3+ VIEW COMPARISON:  Chest radiograph - 09/25/2016; 05/08/2016; 05/09/2014 FINDINGS: Grossly unchanged cardiac silhouette and mediastinal contours. Atherosclerotic plaque when the thoracic aorta. The lungs remain hyperexpanded with flattening the diaphragms and diffuse nodular thickening of the pulmonary interstitium. Bibasilar heterogeneous opacities are unchanged, left greater than right, likely atelectasis or scar. Grossly unchanged biapical pleuroparenchymal thickening. Suspected old/healed fractures involving the anterior aspects of the right 9th and 10th ribs. No definite displaced right-sided acute  rib fractures with special attention paid to the area demarcated by the radiopaque BB. Post L1 vertebral body cement augmentation, incompletely evaluated. IMPRESSION: 1. Similar findings of lung hyperexpansion and chronic bronchitic change without superimposed acute cardiopulmonary disease. 2. No definite acutely displaced rib fractures special attention paid to the area demarcated by the radiopaque BB. 3. Old/healed right ninth and tenth rib fractures. Electronically Signed   By: Simonne ComeJohn  Watts M.D.   On: 03/24/2017 08:19   Dg Shoulder Right  Result Date: 03/24/2017 CLINICAL DATA:  Post fall, now with right shoulder pain. EXAM: RIGHT SHOULDER - 2+ VIEW COMPARISON:  Chest and right rib radiographic series - earlier same day ; right shoulder MRI - 05/04/2026 FINDINGS: No fracture or dislocation. Glenohumeral joint spaces appear preserved. Mile degenerative change the right [facility] joint with joint space loss, subchondral sclerosis and inferiorly directed osteophytosis. No evidence of calcific tendinitis. Regional soft tissues appear normal. Limited visualization of the adjacent thorax is normal. IMPRESSION: 1. No acute findings. 2. Mild degenerative change of the right AC joint. Electronically Signed   By: Simonne ComeJohn  Watts M.D.   On: 03/24/2017 08:16    Procedures Procedures (including critical care time)  Medications Ordered in ED Medications  HYDROcodone-acetaminophen (NORCO/VICODIN) 5-325 MG per tablet 1 tablet (not administered)  ibuprofen (ADVIL,MOTRIN) tablet 400 mg (not administered)     Initial Impression / Assessment and Plan / ED Course  I have reviewed the triage vital signs and the nursing notes.  Pertinent labs & imaging results that were available during my care of the patient were reviewed by me and considered in my medical decision making (see chart for details).    Patient presenting after mechanical fall with persistent pain in the right clavicle and ribs.  She states she has some  shoulder pain but it is mostly in the clavicle.  Some mild tenderness with palpation to the proximal humerus as well.  Hand grip and sensation is intact in the right hand.  Patient's oxygen saturation is between 9397%.  She does have mild tachypnea and tachycardia.  She denies any abdominal pain and low suspicion for abdominal injury.  She is not on anticoagulation.  Concern for rib fracture but equal breath sounds and low suspicion for pneumothorax. Rib films and right shoulder imaging is pending.  Patient given pain control.  8:42 AM Imaging is negative for acute fracture.  Patient's symptoms are classic for rib fracture.  She was given pain control and lifting precautions.  Final Clinical Impressions(s) / ED Diagnoses   Final diagnoses:  Fall, initial encounter  Contusion of  right shoulder, initial encounter  Contusion of rib on right side, initial encounter    ED Discharge Orders        Ordered    HYDROcodone-acetaminophen (NORCO/VICODIN) 5-325 MG tablet  Every 6 hours PRN     03/24/17 0844       Gwyneth SproutPlunkett, Lorissa Kishbaugh, MD 03/24/17 [phone]

## 2017-03-24 NOTE — ED Triage Notes (Signed)
Patient presents to the ED from home with complaints of fall on Thursdays where she tripped over the dog and fell and fell on hardwood floors. Patient complaints of pain to right shoulder and right ribs. Patient reports it is painful to take a deep breath. Patient reports she thought the pain had improved yesterday, then this ,morning the pain got worse.

## 2017-04-25 ENCOUNTER — Other Ambulatory Visit: Payer: Self-pay | Admitting: Internal Medicine

## 2017-04-25 ENCOUNTER — Ambulatory Visit
Admission: RE | Admit: 2017-04-25 | Discharge: 2017-04-25 | Disposition: A | Discharge status: Home / Self Care | Payer: Medicare Other | Source: Ambulatory Visit | Attending: Internal Medicine | Admitting: Internal Medicine

## 2017-04-25 DIAGNOSIS — R0781 Pleurodynia: Secondary | ICD-10-CM

## 2017-06-24 ENCOUNTER — Other Ambulatory Visit: Payer: Self-pay | Admitting: Internal Medicine

## 2017-06-24 DIAGNOSIS — Z1231 Encounter for screening mammogram for malignant neoplasm of breast: Secondary | ICD-10-CM

## 2017-07-08 ENCOUNTER — Ambulatory Visit
Admission: RE | Admit: 2017-07-08 | Discharge: 2017-07-08 | Disposition: A | Discharge status: Home / Self Care | Payer: Medicare Other | Source: Ambulatory Visit | Attending: Internal Medicine | Admitting: Internal Medicine

## 2017-07-08 ENCOUNTER — Other Ambulatory Visit: Payer: Self-pay | Admitting: Internal Medicine

## 2017-07-08 DIAGNOSIS — S93601A Unspecified sprain of right foot, initial encounter: Secondary | ICD-10-CM

## 2017-07-12 ENCOUNTER — Ambulatory Visit
Admission: RE | Admit: 2017-07-12 | Discharge: 2017-07-12 | Disposition: A | Discharge status: Home / Self Care | Payer: Medicare Other | Source: Ambulatory Visit | Attending: Internal Medicine | Admitting: Internal Medicine

## 2017-07-12 DIAGNOSIS — Z1231 Encounter for screening mammogram for malignant neoplasm of breast: Secondary | ICD-10-CM

## 2017-07-16 ENCOUNTER — Other Ambulatory Visit: Payer: Self-pay | Admitting: Internal Medicine

## 2017-07-16 DIAGNOSIS — R928 Other abnormal and inconclusive findings on diagnostic imaging of breast: Secondary | ICD-10-CM

## 2017-07-19 ENCOUNTER — Ambulatory Visit
Admission: RE | Admit: 2017-07-19 | Discharge: 2017-07-19 | Disposition: A | Discharge status: Home / Self Care | Payer: Medicare Other | Source: Ambulatory Visit | Attending: Internal Medicine | Admitting: Internal Medicine

## 2017-07-19 DIAGNOSIS — R928 Other abnormal and inconclusive findings on diagnostic imaging of breast: Secondary | ICD-10-CM

## 2017-11-06 ENCOUNTER — Other Ambulatory Visit (HOSPITAL_COMMUNITY): Payer: Self-pay | Admitting: Internal Medicine

## 2017-11-06 ENCOUNTER — Encounter (HOSPITAL_COMMUNITY): Payer: Self-pay

## 2017-11-06 ENCOUNTER — Ambulatory Visit (HOSPITAL_COMMUNITY)
Admission: RE | Admit: 2017-11-06 | Discharge: 2017-11-06 | Disposition: A | Discharge status: Home / Self Care | Payer: Medicare Other | Source: Ambulatory Visit | Attending: Internal Medicine | Admitting: Internal Medicine

## 2017-11-06 DIAGNOSIS — R140 Abdominal distension (gaseous): Secondary | ICD-10-CM

## 2017-11-06 MED ORDER — BARIUM SULFATE 2.1 % PO SUSP
ORAL | Status: AC
  Filled 2017-11-06: qty 1

## 2017-11-06 MED ORDER — IOHEXOL 300 MG/ML  SOLN
80.0000 mL | Freq: Once | INTRAMUSCULAR | Status: AC | PRN
  Administered 2017-11-06: 80 mL via INTRAVENOUS

## 2019-01-22 ENCOUNTER — Other Ambulatory Visit: Payer: Self-pay | Admitting: Internal Medicine

## 2019-01-22 DIAGNOSIS — Z1231 Encounter for screening mammogram for malignant neoplasm of breast: Secondary | ICD-10-CM

## 2019-03-11 ENCOUNTER — Ambulatory Visit: Payer: Medicare Other

## 2019-05-01 ENCOUNTER — Ambulatory Visit
Admission: RE | Admit: 2019-05-01 | Discharge: 2019-05-01 | Disposition: A | Discharge status: Home / Self Care | Payer: Medicare Other | Source: Ambulatory Visit | Attending: Internal Medicine | Admitting: Internal Medicine

## 2019-05-01 ENCOUNTER — Other Ambulatory Visit: Payer: Self-pay

## 2019-05-01 DIAGNOSIS — Z1231 Encounter for screening mammogram for malignant neoplasm of breast: Secondary | ICD-10-CM

## 2019-05-04 ENCOUNTER — Other Ambulatory Visit: Payer: Self-pay | Admitting: Internal Medicine

## 2019-05-04 DIAGNOSIS — R928 Other abnormal and inconclusive findings on diagnostic imaging of breast: Secondary | ICD-10-CM

## 2019-05-20 ENCOUNTER — Other Ambulatory Visit: Payer: Medicare Other

## 2019-05-22 ENCOUNTER — Other Ambulatory Visit: Payer: Medicare Other

## 2019-05-26 ENCOUNTER — Telehealth: Payer: Self-pay | Admitting: Nurse Practitioner

## 2019-05-26 NOTE — Telephone Encounter (Signed)
Called to Discuss with patient about Covid symptoms and the use of bamlanivimab, a monoclonal antibody infusion for those with mild to moderate Covid symptoms and at a high risk of hospitalization.     Pt is qualified for this infusion at the [facility] infusion center due to co-morbid conditions and/or a member of an at-risk group.     Unable to reach pt  

## 2019-06-29 ENCOUNTER — Ambulatory Visit
Admission: RE | Admit: 2019-06-29 | Discharge: 2019-06-29 | Disposition: A | Discharge status: Home / Self Care | Payer: Medicare PPO | Source: Ambulatory Visit | Attending: Internal Medicine | Admitting: Internal Medicine

## 2019-06-29 DIAGNOSIS — R928 Other abnormal and inconclusive findings on diagnostic imaging of breast: Secondary | ICD-10-CM

## 2019-08-13 NOTE — Telephone Encounter (Signed)
Error

## 2019-08-21 ENCOUNTER — Ambulatory Visit: Payer: Medicare PPO | Attending: Internal Medicine

## 2019-08-21 DIAGNOSIS — Z23 Encounter for immunization: Secondary | ICD-10-CM

## 2019-08-21 NOTE — Progress Notes (Signed)
   Covid-19 Vaccination Clinic  Name:  Ariel Meyer    MRN: [id number] DOB: [birth year]  08/21/2019  Ariel Meyer was observed post Covid-19 immunization for 30 minutes based on pre-vaccination screening without incident. She was provided with Vaccine Information Sheet and instruction to access the V-Safe system.   Ariel Meyer was instructed to call 911 with any severe reactions post vaccine: Marland Kitchen Difficulty breathing  . Swelling of face and throat  . A fast heartbeat  . A bad rash all over body  . Dizziness and weakness   Immunizations Administered    Name Date Dose VIS Date Route   Pfizer COVID-19 Vaccine 08/21/2019 10:00 AM 0.3 mL 04/24/2019 Intramuscular   Manufacturer: [employer], [employer]   Lot: [id number]   NDC: [address]-5

## 2019-08-28 DIAGNOSIS — G4733 Obstructive sleep apnea (adult) (pediatric): Secondary | ICD-10-CM | POA: Diagnosis not present

## 2019-09-03 DIAGNOSIS — Z8616 Personal history of COVID-19: Secondary | ICD-10-CM | POA: Diagnosis not present

## 2019-09-03 DIAGNOSIS — T50Z95S Adverse effect of other vaccines and biological substances, sequela: Secondary | ICD-10-CM | POA: Diagnosis not present

## 2019-09-03 DIAGNOSIS — J0110 Acute frontal sinusitis, unspecified: Secondary | ICD-10-CM | POA: Diagnosis not present

## 2019-09-16 ENCOUNTER — Ambulatory Visit: Payer: Medicare PPO | Attending: Internal Medicine

## 2019-09-16 DIAGNOSIS — Z23 Encounter for immunization: Secondary | ICD-10-CM

## 2019-09-16 NOTE — Progress Notes (Signed)
   Covid-19 Vaccination Clinic  Name:  Jahnasia Tatum    MRN: [id number] DOB: 12-05-1950  09/16/2019  Ms. Stepka was observed post Covid-19 immunization for 15 minutes without incident. She was provided with Vaccine Information Sheet and instruction to access the V-Safe system.   Ms. Persing was instructed to call 911 with any severe reactions post vaccine: Marland Kitchen Difficulty breathing  . Swelling of face and throat  . A fast heartbeat  . A bad rash all over body  . Dizziness and weakness   Immunizations Administered    Name Date Dose VIS Date Route   Pfizer COVID-19 Vaccine 09/16/2019 10:19 AM 0.3 mL 07/08/2018 Intramuscular   Manufacturer: [employer], [employer]   Lot: [id number]   NDC: [address]-1

## 2019-10-30 DIAGNOSIS — J343 Hypertrophy of nasal turbinates: Secondary | ICD-10-CM | POA: Diagnosis not present

## 2019-10-30 DIAGNOSIS — J342 Deviated nasal septum: Secondary | ICD-10-CM | POA: Diagnosis not present

## 2019-10-30 DIAGNOSIS — J310 Chronic rhinitis: Secondary | ICD-10-CM | POA: Diagnosis not present

## 2019-11-02 DIAGNOSIS — M810 Age-related osteoporosis without current pathological fracture: Secondary | ICD-10-CM | POA: Diagnosis not present

## 2019-11-02 DIAGNOSIS — E11649 Type 2 diabetes mellitus with hypoglycemia without coma: Secondary | ICD-10-CM | POA: Diagnosis not present

## 2019-11-02 DIAGNOSIS — E785 Hyperlipidemia, unspecified: Secondary | ICD-10-CM | POA: Diagnosis not present

## 2019-11-02 DIAGNOSIS — E7841 Elevated Lipoprotein(a): Secondary | ICD-10-CM | POA: Diagnosis not present

## 2019-11-02 DIAGNOSIS — M1990 Unspecified osteoarthritis, unspecified site: Secondary | ICD-10-CM | POA: Diagnosis not present

## 2019-11-02 DIAGNOSIS — I10 Essential (primary) hypertension: Secondary | ICD-10-CM | POA: Diagnosis not present

## 2019-11-02 DIAGNOSIS — F324 Major depressive disorder, single episode, in partial remission: Secondary | ICD-10-CM | POA: Diagnosis not present

## 2019-11-02 DIAGNOSIS — E1169 Type 2 diabetes mellitus with other specified complication: Secondary | ICD-10-CM | POA: Diagnosis not present

## 2019-11-04 ENCOUNTER — Other Ambulatory Visit: Payer: Self-pay | Admitting: Otolaryngology

## 2019-11-04 DIAGNOSIS — J329 Chronic sinusitis, unspecified: Secondary | ICD-10-CM

## 2019-11-18 ENCOUNTER — Other Ambulatory Visit: Payer: Self-pay

## 2019-11-18 ENCOUNTER — Ambulatory Visit
Admission: RE | Admit: 2019-11-18 | Discharge: 2019-11-18 | Disposition: A | Discharge status: Home / Self Care | Payer: Medicare PPO | Source: Ambulatory Visit | Attending: Otolaryngology | Admitting: Otolaryngology

## 2019-11-18 DIAGNOSIS — J329 Chronic sinusitis, unspecified: Secondary | ICD-10-CM

## 2019-11-18 DIAGNOSIS — J3489 Other specified disorders of nose and nasal sinuses: Secondary | ICD-10-CM | POA: Diagnosis not present

## 2019-11-18 DIAGNOSIS — J321 Chronic frontal sinusitis: Secondary | ICD-10-CM | POA: Diagnosis not present

## 2019-11-18 DIAGNOSIS — J342 Deviated nasal septum: Secondary | ICD-10-CM | POA: Diagnosis not present

## 2019-11-24 ENCOUNTER — Ambulatory Visit
Admission: RE | Admit: 2019-11-24 | Discharge: 2019-11-24 | Disposition: A | Discharge status: Home / Self Care | Payer: Medicare PPO | Source: Ambulatory Visit | Attending: Internal Medicine | Admitting: Internal Medicine

## 2019-11-24 ENCOUNTER — Other Ambulatory Visit: Payer: Self-pay | Admitting: Internal Medicine

## 2019-11-24 DIAGNOSIS — M8588 Other specified disorders of bone density and structure, other site: Secondary | ICD-10-CM

## 2019-11-24 DIAGNOSIS — R0602 Shortness of breath: Secondary | ICD-10-CM

## 2019-11-24 DIAGNOSIS — M419 Scoliosis, unspecified: Secondary | ICD-10-CM | POA: Diagnosis not present

## 2019-11-24 DIAGNOSIS — W19XXXA Unspecified fall, initial encounter: Secondary | ICD-10-CM | POA: Diagnosis not present

## 2019-11-24 DIAGNOSIS — S2241XD Multiple fractures of ribs, right side, subsequent encounter for fracture with routine healing: Secondary | ICD-10-CM | POA: Diagnosis not present

## 2019-11-24 DIAGNOSIS — R0781 Pleurodynia: Secondary | ICD-10-CM

## 2019-11-24 DIAGNOSIS — S299XXA Unspecified injury of thorax, initial encounter: Secondary | ICD-10-CM | POA: Diagnosis not present

## 2019-11-25 DIAGNOSIS — J310 Chronic rhinitis: Secondary | ICD-10-CM | POA: Diagnosis not present

## 2019-11-25 DIAGNOSIS — J342 Deviated nasal septum: Secondary | ICD-10-CM | POA: Diagnosis not present

## 2019-11-25 DIAGNOSIS — J343 Hypertrophy of nasal turbinates: Secondary | ICD-10-CM | POA: Diagnosis not present

## 2019-12-02 DIAGNOSIS — M1990 Unspecified osteoarthritis, unspecified site: Secondary | ICD-10-CM | POA: Diagnosis not present

## 2019-12-02 DIAGNOSIS — E785 Hyperlipidemia, unspecified: Secondary | ICD-10-CM | POA: Diagnosis not present

## 2019-12-02 DIAGNOSIS — F3341 Major depressive disorder, recurrent, in partial remission: Secondary | ICD-10-CM | POA: Diagnosis not present

## 2019-12-02 DIAGNOSIS — M810 Age-related osteoporosis without current pathological fracture: Secondary | ICD-10-CM | POA: Diagnosis not present

## 2019-12-02 DIAGNOSIS — E1169 Type 2 diabetes mellitus with other specified complication: Secondary | ICD-10-CM | POA: Diagnosis not present

## 2019-12-02 DIAGNOSIS — E11649 Type 2 diabetes mellitus with hypoglycemia without coma: Secondary | ICD-10-CM | POA: Diagnosis not present

## 2019-12-02 DIAGNOSIS — I10 Essential (primary) hypertension: Secondary | ICD-10-CM | POA: Diagnosis not present

## 2019-12-03 DIAGNOSIS — G4733 Obstructive sleep apnea (adult) (pediatric): Secondary | ICD-10-CM | POA: Diagnosis not present

## 2019-12-03 DIAGNOSIS — M62830 Muscle spasm of back: Secondary | ICD-10-CM | POA: Diagnosis not present

## 2019-12-03 DIAGNOSIS — G479 Sleep disorder, unspecified: Secondary | ICD-10-CM | POA: Diagnosis not present

## 2019-12-03 DIAGNOSIS — E1169 Type 2 diabetes mellitus with other specified complication: Secondary | ICD-10-CM | POA: Diagnosis not present

## 2019-12-03 DIAGNOSIS — Z7984 Long term (current) use of oral hypoglycemic drugs: Secondary | ICD-10-CM | POA: Diagnosis not present

## 2019-12-09 DIAGNOSIS — M2560 Stiffness of unspecified joint, not elsewhere classified: Secondary | ICD-10-CM | POA: Diagnosis not present

## 2019-12-09 DIAGNOSIS — S29012S Strain of muscle and tendon of back wall of thorax, sequela: Secondary | ICD-10-CM | POA: Diagnosis not present

## 2019-12-09 DIAGNOSIS — M25519 Pain in unspecified shoulder: Secondary | ICD-10-CM | POA: Diagnosis not present

## 2019-12-17 DIAGNOSIS — G4733 Obstructive sleep apnea (adult) (pediatric): Secondary | ICD-10-CM | POA: Diagnosis not present

## 2019-12-18 DIAGNOSIS — S29012S Strain of muscle and tendon of back wall of thorax, sequela: Secondary | ICD-10-CM | POA: Diagnosis not present

## 2019-12-18 DIAGNOSIS — M2560 Stiffness of unspecified joint, not elsewhere classified: Secondary | ICD-10-CM | POA: Diagnosis not present

## 2019-12-18 DIAGNOSIS — M25519 Pain in unspecified shoulder: Secondary | ICD-10-CM | POA: Diagnosis not present

## 2019-12-25 DIAGNOSIS — M2560 Stiffness of unspecified joint, not elsewhere classified: Secondary | ICD-10-CM | POA: Diagnosis not present

## 2019-12-25 DIAGNOSIS — S29012S Strain of muscle and tendon of back wall of thorax, sequela: Secondary | ICD-10-CM | POA: Diagnosis not present

## 2019-12-25 DIAGNOSIS — M25519 Pain in unspecified shoulder: Secondary | ICD-10-CM | POA: Diagnosis not present

## 2019-12-30 DIAGNOSIS — M2560 Stiffness of unspecified joint, not elsewhere classified: Secondary | ICD-10-CM | POA: Diagnosis not present

## 2019-12-30 DIAGNOSIS — M25519 Pain in unspecified shoulder: Secondary | ICD-10-CM | POA: Diagnosis not present

## 2019-12-30 DIAGNOSIS — S29012S Strain of muscle and tendon of back wall of thorax, sequela: Secondary | ICD-10-CM | POA: Diagnosis not present

## 2020-01-06 DIAGNOSIS — M25519 Pain in unspecified shoulder: Secondary | ICD-10-CM | POA: Diagnosis not present

## 2020-01-06 DIAGNOSIS — S29012S Strain of muscle and tendon of back wall of thorax, sequela: Secondary | ICD-10-CM | POA: Diagnosis not present

## 2020-01-06 DIAGNOSIS — M2560 Stiffness of unspecified joint, not elsewhere classified: Secondary | ICD-10-CM | POA: Diagnosis not present

## 2020-01-08 DIAGNOSIS — S29012S Strain of muscle and tendon of back wall of thorax, sequela: Secondary | ICD-10-CM | POA: Diagnosis not present

## 2020-01-08 DIAGNOSIS — M25519 Pain in unspecified shoulder: Secondary | ICD-10-CM | POA: Diagnosis not present

## 2020-01-08 DIAGNOSIS — M2560 Stiffness of unspecified joint, not elsewhere classified: Secondary | ICD-10-CM | POA: Diagnosis not present

## 2020-01-15 DIAGNOSIS — M2560 Stiffness of unspecified joint, not elsewhere classified: Secondary | ICD-10-CM | POA: Diagnosis not present

## 2020-01-15 DIAGNOSIS — S29012S Strain of muscle and tendon of back wall of thorax, sequela: Secondary | ICD-10-CM | POA: Diagnosis not present

## 2020-01-15 DIAGNOSIS — M25519 Pain in unspecified shoulder: Secondary | ICD-10-CM | POA: Diagnosis not present

## 2020-01-20 DIAGNOSIS — M25519 Pain in unspecified shoulder: Secondary | ICD-10-CM | POA: Diagnosis not present

## 2020-01-20 DIAGNOSIS — M2560 Stiffness of unspecified joint, not elsewhere classified: Secondary | ICD-10-CM | POA: Diagnosis not present

## 2020-01-20 DIAGNOSIS — S29012S Strain of muscle and tendon of back wall of thorax, sequela: Secondary | ICD-10-CM | POA: Diagnosis not present

## 2020-01-21 DIAGNOSIS — F3341 Major depressive disorder, recurrent, in partial remission: Secondary | ICD-10-CM | POA: Diagnosis not present

## 2020-01-21 DIAGNOSIS — E7841 Elevated Lipoprotein(a): Secondary | ICD-10-CM | POA: Diagnosis not present

## 2020-01-21 DIAGNOSIS — M810 Age-related osteoporosis without current pathological fracture: Secondary | ICD-10-CM | POA: Diagnosis not present

## 2020-01-21 DIAGNOSIS — M1990 Unspecified osteoarthritis, unspecified site: Secondary | ICD-10-CM | POA: Diagnosis not present

## 2020-01-21 DIAGNOSIS — I10 Essential (primary) hypertension: Secondary | ICD-10-CM | POA: Diagnosis not present

## 2020-01-21 DIAGNOSIS — E1169 Type 2 diabetes mellitus with other specified complication: Secondary | ICD-10-CM | POA: Diagnosis not present

## 2020-01-21 DIAGNOSIS — E11649 Type 2 diabetes mellitus with hypoglycemia without coma: Secondary | ICD-10-CM | POA: Diagnosis not present

## 2020-01-21 DIAGNOSIS — E785 Hyperlipidemia, unspecified: Secondary | ICD-10-CM | POA: Diagnosis not present

## 2020-01-22 DIAGNOSIS — M25519 Pain in unspecified shoulder: Secondary | ICD-10-CM | POA: Diagnosis not present

## 2020-01-22 DIAGNOSIS — M2560 Stiffness of unspecified joint, not elsewhere classified: Secondary | ICD-10-CM | POA: Diagnosis not present

## 2020-01-22 DIAGNOSIS — S29012S Strain of muscle and tendon of back wall of thorax, sequela: Secondary | ICD-10-CM | POA: Diagnosis not present

## 2020-01-27 DIAGNOSIS — M25519 Pain in unspecified shoulder: Secondary | ICD-10-CM | POA: Diagnosis not present

## 2020-01-27 DIAGNOSIS — S29012S Strain of muscle and tendon of back wall of thorax, sequela: Secondary | ICD-10-CM | POA: Diagnosis not present

## 2020-01-27 DIAGNOSIS — M2560 Stiffness of unspecified joint, not elsewhere classified: Secondary | ICD-10-CM | POA: Diagnosis not present

## 2020-01-29 DIAGNOSIS — M25519 Pain in unspecified shoulder: Secondary | ICD-10-CM | POA: Diagnosis not present

## 2020-01-29 DIAGNOSIS — M2560 Stiffness of unspecified joint, not elsewhere classified: Secondary | ICD-10-CM | POA: Diagnosis not present

## 2020-01-29 DIAGNOSIS — S29012S Strain of muscle and tendon of back wall of thorax, sequela: Secondary | ICD-10-CM | POA: Diagnosis not present

## 2020-02-05 DIAGNOSIS — M25519 Pain in unspecified shoulder: Secondary | ICD-10-CM | POA: Diagnosis not present

## 2020-02-05 DIAGNOSIS — M2560 Stiffness of unspecified joint, not elsewhere classified: Secondary | ICD-10-CM | POA: Diagnosis not present

## 2020-02-05 DIAGNOSIS — S29012S Strain of muscle and tendon of back wall of thorax, sequela: Secondary | ICD-10-CM | POA: Diagnosis not present

## 2020-02-11 DIAGNOSIS — S29012S Strain of muscle and tendon of back wall of thorax, sequela: Secondary | ICD-10-CM | POA: Diagnosis not present

## 2020-02-11 DIAGNOSIS — M2560 Stiffness of unspecified joint, not elsewhere classified: Secondary | ICD-10-CM | POA: Diagnosis not present

## 2020-02-11 DIAGNOSIS — M25519 Pain in unspecified shoulder: Secondary | ICD-10-CM | POA: Diagnosis not present

## 2020-02-12 DIAGNOSIS — E1169 Type 2 diabetes mellitus with other specified complication: Secondary | ICD-10-CM | POA: Diagnosis not present

## 2020-02-12 DIAGNOSIS — R059 Cough, unspecified: Secondary | ICD-10-CM | POA: Diagnosis not present

## 2020-02-12 DIAGNOSIS — J0111 Acute recurrent frontal sinusitis: Secondary | ICD-10-CM | POA: Diagnosis not present

## 2020-02-15 ENCOUNTER — Ambulatory Visit
Admission: RE | Admit: 2020-02-15 | Discharge: 2020-02-15 | Disposition: A | Discharge status: Home / Self Care | Payer: Medicare PPO | Source: Ambulatory Visit | Attending: Internal Medicine | Admitting: Internal Medicine

## 2020-02-15 ENCOUNTER — Other Ambulatory Visit: Payer: Self-pay

## 2020-02-15 DIAGNOSIS — M85851 Other specified disorders of bone density and structure, right thigh: Secondary | ICD-10-CM | POA: Diagnosis not present

## 2020-02-15 DIAGNOSIS — M8588 Other specified disorders of bone density and structure, other site: Secondary | ICD-10-CM

## 2020-02-19 DIAGNOSIS — S29012S Strain of muscle and tendon of back wall of thorax, sequela: Secondary | ICD-10-CM | POA: Diagnosis not present

## 2020-02-19 DIAGNOSIS — M2560 Stiffness of unspecified joint, not elsewhere classified: Secondary | ICD-10-CM | POA: Diagnosis not present

## 2020-02-19 DIAGNOSIS — M25519 Pain in unspecified shoulder: Secondary | ICD-10-CM | POA: Diagnosis not present

## 2020-02-22 DIAGNOSIS — G4733 Obstructive sleep apnea (adult) (pediatric): Secondary | ICD-10-CM | POA: Diagnosis not present

## 2020-02-26 DIAGNOSIS — E1169 Type 2 diabetes mellitus with other specified complication: Secondary | ICD-10-CM | POA: Diagnosis not present

## 2020-02-26 DIAGNOSIS — E785 Hyperlipidemia, unspecified: Secondary | ICD-10-CM | POA: Diagnosis not present

## 2020-02-26 DIAGNOSIS — E11649 Type 2 diabetes mellitus with hypoglycemia without coma: Secondary | ICD-10-CM | POA: Diagnosis not present

## 2020-02-26 DIAGNOSIS — I10 Essential (primary) hypertension: Secondary | ICD-10-CM | POA: Diagnosis not present

## 2020-02-26 DIAGNOSIS — M1990 Unspecified osteoarthritis, unspecified site: Secondary | ICD-10-CM | POA: Diagnosis not present

## 2020-02-26 DIAGNOSIS — F3341 Major depressive disorder, recurrent, in partial remission: Secondary | ICD-10-CM | POA: Diagnosis not present

## 2020-02-26 DIAGNOSIS — M810 Age-related osteoporosis without current pathological fracture: Secondary | ICD-10-CM | POA: Diagnosis not present

## 2020-03-10 DIAGNOSIS — S29012S Strain of muscle and tendon of back wall of thorax, sequela: Secondary | ICD-10-CM | POA: Diagnosis not present

## 2020-03-10 DIAGNOSIS — M25519 Pain in unspecified shoulder: Secondary | ICD-10-CM | POA: Diagnosis not present

## 2020-03-10 DIAGNOSIS — M2560 Stiffness of unspecified joint, not elsewhere classified: Secondary | ICD-10-CM | POA: Diagnosis not present

## 2020-03-17 DIAGNOSIS — S29012S Strain of muscle and tendon of back wall of thorax, sequela: Secondary | ICD-10-CM | POA: Diagnosis not present

## 2020-03-17 DIAGNOSIS — M25519 Pain in unspecified shoulder: Secondary | ICD-10-CM | POA: Diagnosis not present

## 2020-03-17 DIAGNOSIS — M2560 Stiffness of unspecified joint, not elsewhere classified: Secondary | ICD-10-CM | POA: Diagnosis not present

## 2020-03-25 DIAGNOSIS — M25519 Pain in unspecified shoulder: Secondary | ICD-10-CM | POA: Diagnosis not present

## 2020-03-25 DIAGNOSIS — S29012S Strain of muscle and tendon of back wall of thorax, sequela: Secondary | ICD-10-CM | POA: Diagnosis not present

## 2020-03-25 DIAGNOSIS — M2560 Stiffness of unspecified joint, not elsewhere classified: Secondary | ICD-10-CM | POA: Diagnosis not present

## 2020-03-31 DIAGNOSIS — S29012S Strain of muscle and tendon of back wall of thorax, sequela: Secondary | ICD-10-CM | POA: Diagnosis not present

## 2020-03-31 DIAGNOSIS — M25519 Pain in unspecified shoulder: Secondary | ICD-10-CM | POA: Diagnosis not present

## 2020-03-31 DIAGNOSIS — M2560 Stiffness of unspecified joint, not elsewhere classified: Secondary | ICD-10-CM | POA: Diagnosis not present

## 2020-04-01 DIAGNOSIS — K219 Gastro-esophageal reflux disease without esophagitis: Secondary | ICD-10-CM | POA: Diagnosis not present

## 2020-04-01 DIAGNOSIS — F3341 Major depressive disorder, recurrent, in partial remission: Secondary | ICD-10-CM | POA: Diagnosis not present

## 2020-04-01 DIAGNOSIS — E11649 Type 2 diabetes mellitus with hypoglycemia without coma: Secondary | ICD-10-CM | POA: Diagnosis not present

## 2020-04-01 DIAGNOSIS — E1169 Type 2 diabetes mellitus with other specified complication: Secondary | ICD-10-CM | POA: Diagnosis not present

## 2020-04-01 DIAGNOSIS — I10 Essential (primary) hypertension: Secondary | ICD-10-CM | POA: Diagnosis not present

## 2020-04-01 DIAGNOSIS — M1990 Unspecified osteoarthritis, unspecified site: Secondary | ICD-10-CM | POA: Diagnosis not present

## 2020-04-01 DIAGNOSIS — E785 Hyperlipidemia, unspecified: Secondary | ICD-10-CM | POA: Diagnosis not present

## 2020-04-06 DIAGNOSIS — Z1211 Encounter for screening for malignant neoplasm of colon: Secondary | ICD-10-CM | POA: Diagnosis not present

## 2020-04-06 DIAGNOSIS — I10 Essential (primary) hypertension: Secondary | ICD-10-CM | POA: Diagnosis not present

## 2020-04-06 DIAGNOSIS — M85859 Other specified disorders of bone density and structure, unspecified thigh: Secondary | ICD-10-CM | POA: Diagnosis not present

## 2020-04-06 DIAGNOSIS — Z1231 Encounter for screening mammogram for malignant neoplasm of breast: Secondary | ICD-10-CM | POA: Diagnosis not present

## 2020-04-06 DIAGNOSIS — Z23 Encounter for immunization: Secondary | ICD-10-CM | POA: Diagnosis not present

## 2020-04-06 DIAGNOSIS — Z0000 Encounter for general adult medical examination without abnormal findings: Secondary | ICD-10-CM | POA: Diagnosis not present

## 2020-04-06 DIAGNOSIS — E1169 Type 2 diabetes mellitus with other specified complication: Secondary | ICD-10-CM | POA: Diagnosis not present

## 2020-04-14 DIAGNOSIS — M2560 Stiffness of unspecified joint, not elsewhere classified: Secondary | ICD-10-CM | POA: Diagnosis not present

## 2020-04-14 DIAGNOSIS — S29012S Strain of muscle and tendon of back wall of thorax, sequela: Secondary | ICD-10-CM | POA: Diagnosis not present

## 2020-04-14 DIAGNOSIS — M25519 Pain in unspecified shoulder: Secondary | ICD-10-CM | POA: Diagnosis not present

## 2020-04-20 DIAGNOSIS — M2560 Stiffness of unspecified joint, not elsewhere classified: Secondary | ICD-10-CM | POA: Diagnosis not present

## 2020-04-20 DIAGNOSIS — M25519 Pain in unspecified shoulder: Secondary | ICD-10-CM | POA: Diagnosis not present

## 2020-04-20 DIAGNOSIS — S29012S Strain of muscle and tendon of back wall of thorax, sequela: Secondary | ICD-10-CM | POA: Diagnosis not present

## 2020-04-27 DIAGNOSIS — S29012S Strain of muscle and tendon of back wall of thorax, sequela: Secondary | ICD-10-CM | POA: Diagnosis not present

## 2020-04-27 DIAGNOSIS — M2560 Stiffness of unspecified joint, not elsewhere classified: Secondary | ICD-10-CM | POA: Diagnosis not present

## 2020-04-27 DIAGNOSIS — M25519 Pain in unspecified shoulder: Secondary | ICD-10-CM | POA: Diagnosis not present

## 2020-04-28 DIAGNOSIS — K219 Gastro-esophageal reflux disease without esophagitis: Secondary | ICD-10-CM | POA: Diagnosis not present

## 2020-04-28 DIAGNOSIS — I10 Essential (primary) hypertension: Secondary | ICD-10-CM | POA: Diagnosis not present

## 2020-04-28 DIAGNOSIS — E785 Hyperlipidemia, unspecified: Secondary | ICD-10-CM | POA: Diagnosis not present

## 2020-04-28 DIAGNOSIS — E11649 Type 2 diabetes mellitus with hypoglycemia without coma: Secondary | ICD-10-CM | POA: Diagnosis not present

## 2020-04-28 DIAGNOSIS — E1169 Type 2 diabetes mellitus with other specified complication: Secondary | ICD-10-CM | POA: Diagnosis not present

## 2020-04-28 DIAGNOSIS — F3341 Major depressive disorder, recurrent, in partial remission: Secondary | ICD-10-CM | POA: Diagnosis not present

## 2020-04-28 DIAGNOSIS — M1990 Unspecified osteoarthritis, unspecified site: Secondary | ICD-10-CM | POA: Diagnosis not present

## 2020-05-04 DIAGNOSIS — M25519 Pain in unspecified shoulder: Secondary | ICD-10-CM | POA: Diagnosis not present

## 2020-05-04 DIAGNOSIS — M2560 Stiffness of unspecified joint, not elsewhere classified: Secondary | ICD-10-CM | POA: Diagnosis not present

## 2020-05-04 DIAGNOSIS — S29012S Strain of muscle and tendon of back wall of thorax, sequela: Secondary | ICD-10-CM | POA: Diagnosis not present

## 2020-05-11 DIAGNOSIS — M25519 Pain in unspecified shoulder: Secondary | ICD-10-CM | POA: Diagnosis not present

## 2020-05-11 DIAGNOSIS — M2560 Stiffness of unspecified joint, not elsewhere classified: Secondary | ICD-10-CM | POA: Diagnosis not present

## 2020-05-11 DIAGNOSIS — S29012S Strain of muscle and tendon of back wall of thorax, sequela: Secondary | ICD-10-CM | POA: Diagnosis not present

## 2020-05-14 ENCOUNTER — Emergency Department (HOSPITAL_COMMUNITY)
Admission: EM | Admit: 2020-05-14 | Discharge: 2020-05-14 | Disposition: A | Discharge status: Home / Self Care | Payer: Medicare PPO | Attending: Emergency Medicine | Admitting: Emergency Medicine

## 2020-05-14 ENCOUNTER — Encounter (HOSPITAL_COMMUNITY): Payer: Self-pay | Admitting: Emergency Medicine

## 2020-05-14 ENCOUNTER — Emergency Department (HOSPITAL_COMMUNITY): Payer: Medicare PPO

## 2020-05-14 ENCOUNTER — Other Ambulatory Visit: Payer: Self-pay

## 2020-05-14 DIAGNOSIS — I129 Hypertensive chronic kidney disease with stage 1 through stage 4 chronic kidney disease, or unspecified chronic kidney disease: Secondary | ICD-10-CM | POA: Diagnosis not present

## 2020-05-14 DIAGNOSIS — M25531 Pain in right wrist: Secondary | ICD-10-CM | POA: Diagnosis not present

## 2020-05-14 DIAGNOSIS — S6991XA Unspecified injury of right wrist, hand and finger(s), initial encounter: Secondary | ICD-10-CM | POA: Diagnosis present

## 2020-05-14 DIAGNOSIS — S52501A Unspecified fracture of the lower end of right radius, initial encounter for closed fracture: Secondary | ICD-10-CM | POA: Diagnosis not present

## 2020-05-14 DIAGNOSIS — N189 Chronic kidney disease, unspecified: Secondary | ICD-10-CM | POA: Insufficient documentation

## 2020-05-14 DIAGNOSIS — W0110XA Fall on same level from slipping, tripping and stumbling with subsequent striking against unspecified object, initial encounter: Secondary | ICD-10-CM | POA: Diagnosis not present

## 2020-05-14 DIAGNOSIS — E1122 Type 2 diabetes mellitus with diabetic chronic kidney disease: Secondary | ICD-10-CM | POA: Diagnosis not present

## 2020-05-14 DIAGNOSIS — S52571A Other intraarticular fracture of lower end of right radius, initial encounter for closed fracture: Secondary | ICD-10-CM | POA: Insufficient documentation

## 2020-05-14 DIAGNOSIS — Z87891 Personal history of nicotine dependence: Secondary | ICD-10-CM | POA: Diagnosis not present

## 2020-05-14 DIAGNOSIS — S62101A Fracture of unspecified carpal bone, right wrist, initial encounter for closed fracture: Secondary | ICD-10-CM

## 2020-05-14 DIAGNOSIS — S6291XA Unspecified fracture of right wrist and hand, initial encounter for closed fracture: Secondary | ICD-10-CM | POA: Diagnosis not present

## 2020-05-14 MED ORDER — HYDROCODONE-ACETAMINOPHEN 5-325 MG PO TABS: 1.0000 | ORAL_TABLET | Freq: Two times a day (BID) | ORAL | 0 refills | Status: AC | PRN

## 2020-05-14 MED ORDER — ACETAMINOPHEN 500 MG PO TABS
1000.0000 mg | ORAL_TABLET | Freq: Once | ORAL | Status: AC
  Administered 2020-05-14: 1000 mg via ORAL
  Filled 2020-05-14: qty 2

## 2020-05-14 NOTE — ED Triage Notes (Signed)
Pt c/o right wrist pain and swelling after fall today. Denies dizziness/LOC, or hitting her head.

## 2020-05-14 NOTE — Discharge Instructions (Addendum)
You have fractured your wrist. Please take the medications prescribed for pain control. Follow-up with the orthopedic hand doctor by calling them on Monday for an appointment to be seen the same week.

## 2020-05-14 NOTE — ED Notes (Signed)
Splint placed on right wrist.

## 2020-05-14 NOTE — Progress Notes (Signed)
Orthopedic Tech Progress Note Patient Details:  Ariel Meyer [birth year] [id number]  Ortho Devices Type of Ortho Device: Arm sling,Sugartong splint Ortho Device/Splint Location: rue Ortho Device/Splint Interventions: Ordered,Application,Adjustment   Post Interventions Patient Tolerated: Well Instructions Provided: Care of device,Adjustment of device   [name] Post 05/14/2020, 8:33 PM

## 2020-05-14 NOTE — ED Provider Notes (Signed)
[facility] EMERGENCY DEPARTMENT Provider Note   CSN: [id number] Arrival date & time: 05/14/20  1611     History Chief Complaint  Patient presents with  . Wrist Pain    Ariel Meyer is a 70 y.o. female.  HPI    70 year old female comes in a chief complaint of wrist pain. Patient has history of diabetes. She reports that she got up from sitting position, lost her balance and fell. She fell on the floor and hit her right wrist as she was going.  Patient has significant discomfort with any kind of motion of the wrist and her digits of the right side.  She denies any severe numbness or tingling.  She is not on any blood thinners.  Patient denies any chest pain, shortness of breath, abdominal pain, headaches, LOC.  Patient did not strike her head.  Past Medical History:  Diagnosis Date  . Anxiety   . Arthritis   . Back problem   . Chronic kidney disease   . Complication of anesthesia    wakes up durning surgery  . Depression   . Diabetes mellitus without complication (HCC)   . Dizziness   . GERD (gastroesophageal reflux disease)   . Hyperlipidemia   . Hypertension   . Pneumonia   . Seasonal allergies   . Shortness of breath dyspnea   . Sleep apnea     Patient Active Problem List   Diagnosis Date Noted  . Upper airway cough syndrome 06/21/2015  . Acute encephalopathy 05/10/2014  . Alcohol withdrawal delirium (HCC) 05/10/2014  . Difficult intubation   . Acute respiratory failure with hypoxia (HCC)   . Angioedema 05/06/2014  . Goiter 05/06/2014  . Hypertension 05/06/2014  . Acute sinusitis 02/10/2014  . Nausea with vomiting 02/10/2014  . 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 Renal insufficiency 02/07/2014  . Acute renal failure (HCC) 02/07/2014  . Enteritis due to Clostridium difficile 02/07/2014  . Hypotension 02/07/2014  . Benign hypertension 02/07/2014  . Obesity, morbid (HCC) 02/07/2014  . GERD  (gastroesophageal reflux disease) 02/07/2014  . Hyperlipidemia 02/07/2014  . Sciatica 06/10/2012  . Lumbar compression fracture (HCC) 06/10/2012    Past Surgical History:  Procedure Laterality Date  . BACK SURGERY    . KYPHOPLASTY N/A 08/12/2012   Procedure: KYPHOPLASTY;  Surgeon: Clydene Fake, MD;  Location: [facility];  Service: Neurosurgery;  Laterality: N/A;  L1 Balloon Kyphoplasty  . NASAL SINUS SURGERY    . TONSILLECTOMY    . TUBAL LIGATION       OB History   No obstetric history on file.     Family History  Problem Relation Age of Onset  . Heart disease Other   . Arthritis Other     Social History   Tobacco Use  . Smoking status: Former Smoker    Packs/day: 0.75    Years: 30.00    Pack years: 22.50    Types: Cigarettes    Quit date: 05/14/2009    Years since quitting: 11.0  . Smokeless tobacco: Never Used  Substance Use Topics  . Alcohol use: Yes    Alcohol/week: 49.0 standard drinks    Types: 35 Shots of liquor, 14 Standard drinks or equivalent per week  . Drug use: No    Home Medications Prior to Admission medications   Medication Sig Start Date End Date Taking? Authorizing Provider  HYDROcodone-acetaminophen (NORCO/VICODIN) 5-325 MG tablet Take 1 tablet by mouth every  12 (twelve) hours as needed for up to 5 days for severe pain. 05/14/20 05/19/20 Yes Derwood Kaplan, MD  acetaminophen (TYLENOL) 500 MG tablet Take 500 mg 2 (two) times daily by mouth.    [provider]  cholecalciferol (VITAMIN D) 1000 UNITS tablet Take 1,000 Units by mouth daily.    [provider]  famotidine (PEPCID) 10 MG tablet Take 10 mg 2 (two) times daily as needed by mouth for heartburn or indigestion.    [provider]  FLUoxetine (PROZAC) 40 MG capsule Take 40 mg by mouth daily.    [provider]  fluticasone (FLONASE) 50 MCG/ACT nasal spray Place 2 sprays into both nostrils daily. 02/12/14   Joseph Art, DO  gabapentin (NEURONTIN) 100 MG  capsule Take 100-300 mg at bedtime as needed by mouth (pain).    [provider]  Multiple Vitamin (MULTIVITAMIN WITH MINERALS) TABS Take 1 tablet by mouth daily.    [provider]  omeprazole (PRILOSEC) 20 MG capsule Take 20 mg daily by mouth.    [provider]    Allergies    Lisinopril, Sudafed 12 hour [pseudoephedrine hcl er], Dilaudid [hydromorphone hcl], Fosamax [alendronate sodium], Levaquin leva-pak [levofloxacin], Macrodantin [nitrofurantoin macrocrystal], Macrodantin [nitrofurantoin], Norvasc [amlodipine besylate], Penicillins, Sudafed [pseudoephedrine hcl], and Sulfa antibiotics  Review of Systems   Review of Systems  Constitutional: Positive for activity change.  Respiratory: Negative for shortness of breath.   Cardiovascular: Negative for chest pain.  Musculoskeletal: Positive for arthralgias.  Neurological: Negative for headaches.  Hematological: Does not bruise/bleed easily.  All other systems reviewed and are negative.   Physical Exam Updated Vital Signs BP (!) 146/91 (BP Location: Left Arm)   Pulse 86   Temp 98.2 F (36.8 C) (Oral)   Resp 14   Ht 5\' 9"  (1.753 m)   Wt 94.8 kg   SpO2 94%   BMI 30.86 kg/m   Physical Exam Vitals and nursing note reviewed.  Constitutional:      Appearance: She is well-developed.  HENT:     Head: Normocephalic and atraumatic.  Eyes:     Extraocular Movements: EOM normal.  Cardiovascular:     Rate and Rhythm: Normal rate.  Pulmonary:     Effort: Pulmonary effort is normal.  Abdominal:     General: Bowel sounds are normal.  Musculoskeletal:        General: Swelling and tenderness present. No deformity.     Cervical back: Normal range of motion and neck supple.     Comments: Right wrist is edematous.  No gross deformity.  Skin:    General: Skin is warm and dry.  Neurological:     Mental Status: She is alert and oriented to person, place, and time.     Sensory: No sensory deficit.     ED  Results / Procedures / Treatments   Labs (all labs ordered are listed, but only abnormal results are displayed) Labs Reviewed - No data to display  EKG None  Radiology DG Wrist Complete Right  Result Date: 05/14/2020 CLINICAL DATA:  Right wrist pain, swelling, fall EXAM: RIGHT WRIST - COMPLETE 3+ VIEW COMPARISON:  None. FINDINGS: Comminuted intra-articular distal right radial fracture. Mildly displaced fracture fragments. Ulnar styloid fracture. No subluxation or dislocation. IMPRESSION: Comminuted intra-articular right distal radial fracture. Ulnar styloid fracture. Electronically Signed   By: 07/12/2020 M.D.   On: 05/14/2020 17:33    Procedures Procedures (including critical care time)  Medications Ordered in ED Medications  acetaminophen (TYLENOL) tablet 1,000 mg (1,000 mg Oral Given 05/14/20 1953)    ED Course  I have reviewed the triage vital signs and the nursing notes.  Pertinent labs & imaging results that were available during my care of the patient were reviewed by me and considered in my medical decision making (see chart for details).    MDM Rules/Calculators/A&P                          70 year old female comes in with chief complaint of fall.  She has wrist fracture.  The fracture is comminuted, but does not appear to be significantly displaced.  We will put her in a sugar tong splint and have her follow-up with orthopedic hand surgery.  Neurovascularly intact.  Final Clinical Impression(s) / ED Diagnoses Final diagnoses:  Other closed intra-articular fracture of distal end of right radius, initial encounter  Closed fracture of right wrist, initial encounter    Rx / DC Orders ED Discharge Orders         Ordered    HYDROcodone-acetaminophen (NORCO/VICODIN) 5-325 MG tablet  Every 12 hours PRN        05/14/20 2026           Varney Biles, MD 05/14/20 2211

## 2020-05-17 DIAGNOSIS — Z1212 Encounter for screening for malignant neoplasm of rectum: Secondary | ICD-10-CM | POA: Diagnosis not present

## 2020-05-17 DIAGNOSIS — Z1211 Encounter for screening for malignant neoplasm of colon: Secondary | ICD-10-CM | POA: Diagnosis not present

## 2020-05-19 DIAGNOSIS — M25531 Pain in right wrist: Secondary | ICD-10-CM | POA: Diagnosis not present

## 2020-05-19 DIAGNOSIS — S52531A Colles' fracture of right radius, initial encounter for closed fracture: Secondary | ICD-10-CM | POA: Diagnosis not present

## 2020-05-25 DIAGNOSIS — S52571A Other intraarticular fracture of lower end of right radius, initial encounter for closed fracture: Secondary | ICD-10-CM | POA: Diagnosis not present

## 2020-05-25 DIAGNOSIS — G8918 Other acute postprocedural pain: Secondary | ICD-10-CM | POA: Diagnosis not present

## 2020-05-25 DIAGNOSIS — S52531A Colles' fracture of right radius, initial encounter for closed fracture: Secondary | ICD-10-CM | POA: Diagnosis not present

## 2020-05-26 LAB — COLOGUARD: COLOGUARD: NEGATIVE

## 2020-05-26 LAB — EXTERNAL GENERIC LAB PROCEDURE: COLOGUARD: NEGATIVE

## 2020-06-09 DIAGNOSIS — S52531D Colles' fracture of right radius, subsequent encounter for closed fracture with routine healing: Secondary | ICD-10-CM | POA: Diagnosis not present

## 2020-06-09 DIAGNOSIS — Z4789 Encounter for other orthopedic aftercare: Secondary | ICD-10-CM | POA: Diagnosis not present

## 2020-06-09 DIAGNOSIS — S52531A Colles' fracture of right radius, initial encounter for closed fracture: Secondary | ICD-10-CM | POA: Diagnosis not present

## 2020-06-10 DIAGNOSIS — E785 Hyperlipidemia, unspecified: Secondary | ICD-10-CM | POA: Diagnosis not present

## 2020-06-10 DIAGNOSIS — I10 Essential (primary) hypertension: Secondary | ICD-10-CM | POA: Diagnosis not present

## 2020-06-10 DIAGNOSIS — K219 Gastro-esophageal reflux disease without esophagitis: Secondary | ICD-10-CM | POA: Diagnosis not present

## 2020-06-10 DIAGNOSIS — E1169 Type 2 diabetes mellitus with other specified complication: Secondary | ICD-10-CM | POA: Diagnosis not present

## 2020-06-10 DIAGNOSIS — M1990 Unspecified osteoarthritis, unspecified site: Secondary | ICD-10-CM | POA: Diagnosis not present

## 2020-06-10 DIAGNOSIS — F3341 Major depressive disorder, recurrent, in partial remission: Secondary | ICD-10-CM | POA: Diagnosis not present

## 2020-06-10 DIAGNOSIS — E11649 Type 2 diabetes mellitus with hypoglycemia without coma: Secondary | ICD-10-CM | POA: Diagnosis not present

## 2020-06-23 DIAGNOSIS — S52531D Colles' fracture of right radius, subsequent encounter for closed fracture with routine healing: Secondary | ICD-10-CM | POA: Diagnosis not present

## 2020-06-23 DIAGNOSIS — S52531A Colles' fracture of right radius, initial encounter for closed fracture: Secondary | ICD-10-CM | POA: Diagnosis not present

## 2020-06-23 DIAGNOSIS — Z4789 Encounter for other orthopedic aftercare: Secondary | ICD-10-CM | POA: Diagnosis not present

## 2020-06-23 DIAGNOSIS — M25631 Stiffness of right wrist, not elsewhere classified: Secondary | ICD-10-CM | POA: Diagnosis not present

## 2020-06-24 DIAGNOSIS — E119 Type 2 diabetes mellitus without complications: Secondary | ICD-10-CM | POA: Diagnosis not present

## 2020-06-24 DIAGNOSIS — H524 Presbyopia: Secondary | ICD-10-CM | POA: Diagnosis not present

## 2020-07-01 DIAGNOSIS — M25631 Stiffness of right wrist, not elsewhere classified: Secondary | ICD-10-CM | POA: Diagnosis not present

## 2020-07-07 DIAGNOSIS — M25631 Stiffness of right wrist, not elsewhere classified: Secondary | ICD-10-CM | POA: Diagnosis not present

## 2020-07-08 DIAGNOSIS — E1169 Type 2 diabetes mellitus with other specified complication: Secondary | ICD-10-CM | POA: Diagnosis not present

## 2020-07-08 DIAGNOSIS — E785 Hyperlipidemia, unspecified: Secondary | ICD-10-CM | POA: Diagnosis not present

## 2020-07-08 DIAGNOSIS — K219 Gastro-esophageal reflux disease without esophagitis: Secondary | ICD-10-CM | POA: Diagnosis not present

## 2020-07-08 DIAGNOSIS — M1990 Unspecified osteoarthritis, unspecified site: Secondary | ICD-10-CM | POA: Diagnosis not present

## 2020-07-08 DIAGNOSIS — I10 Essential (primary) hypertension: Secondary | ICD-10-CM | POA: Diagnosis not present

## 2020-07-08 DIAGNOSIS — F3341 Major depressive disorder, recurrent, in partial remission: Secondary | ICD-10-CM | POA: Diagnosis not present

## 2020-07-08 DIAGNOSIS — E11649 Type 2 diabetes mellitus with hypoglycemia without coma: Secondary | ICD-10-CM | POA: Diagnosis not present

## 2020-07-14 DIAGNOSIS — M25531 Pain in right wrist: Secondary | ICD-10-CM | POA: Diagnosis not present

## 2020-07-15 DIAGNOSIS — J321 Chronic frontal sinusitis: Secondary | ICD-10-CM | POA: Diagnosis not present

## 2020-07-15 DIAGNOSIS — Z9109 Other allergy status, other than to drugs and biological substances: Secondary | ICD-10-CM | POA: Diagnosis not present

## 2020-07-15 DIAGNOSIS — S62101S Fracture of unspecified carpal bone, right wrist, sequela: Secondary | ICD-10-CM | POA: Diagnosis not present

## 2020-07-19 DIAGNOSIS — M25631 Stiffness of right wrist, not elsewhere classified: Secondary | ICD-10-CM | POA: Diagnosis not present

## 2020-07-21 DIAGNOSIS — Z4789 Encounter for other orthopedic aftercare: Secondary | ICD-10-CM | POA: Diagnosis not present

## 2020-07-21 DIAGNOSIS — S52531A Colles' fracture of right radius, initial encounter for closed fracture: Secondary | ICD-10-CM | POA: Diagnosis not present

## 2020-07-28 DIAGNOSIS — M25631 Stiffness of right wrist, not elsewhere classified: Secondary | ICD-10-CM | POA: Diagnosis not present

## 2020-08-05 DIAGNOSIS — E1169 Type 2 diabetes mellitus with other specified complication: Secondary | ICD-10-CM | POA: Diagnosis not present

## 2020-08-05 DIAGNOSIS — K219 Gastro-esophageal reflux disease without esophagitis: Secondary | ICD-10-CM | POA: Diagnosis not present

## 2020-08-05 DIAGNOSIS — F3341 Major depressive disorder, recurrent, in partial remission: Secondary | ICD-10-CM | POA: Diagnosis not present

## 2020-08-05 DIAGNOSIS — I10 Essential (primary) hypertension: Secondary | ICD-10-CM | POA: Diagnosis not present

## 2020-08-05 DIAGNOSIS — M1990 Unspecified osteoarthritis, unspecified site: Secondary | ICD-10-CM | POA: Diagnosis not present

## 2020-08-05 DIAGNOSIS — E785 Hyperlipidemia, unspecified: Secondary | ICD-10-CM | POA: Diagnosis not present

## 2020-08-05 DIAGNOSIS — E11649 Type 2 diabetes mellitus with hypoglycemia without coma: Secondary | ICD-10-CM | POA: Diagnosis not present

## 2020-08-05 DIAGNOSIS — M25631 Stiffness of right wrist, not elsewhere classified: Secondary | ICD-10-CM | POA: Diagnosis not present

## 2020-08-19 DIAGNOSIS — S52531A Colles' fracture of right radius, initial encounter for closed fracture: Secondary | ICD-10-CM | POA: Diagnosis not present

## 2020-08-19 DIAGNOSIS — Z4789 Encounter for other orthopedic aftercare: Secondary | ICD-10-CM | POA: Diagnosis not present

## 2020-08-19 DIAGNOSIS — S52531D Colles' fracture of right radius, subsequent encounter for closed fracture with routine healing: Secondary | ICD-10-CM | POA: Diagnosis not present

## 2020-08-30 DIAGNOSIS — Z7984 Long term (current) use of oral hypoglycemic drugs: Secondary | ICD-10-CM | POA: Diagnosis not present

## 2020-08-30 DIAGNOSIS — W57XXXA Bitten or stung by nonvenomous insect and other nonvenomous arthropods, initial encounter: Secondary | ICD-10-CM | POA: Diagnosis not present

## 2020-08-30 DIAGNOSIS — S20469A Insect bite (nonvenomous) of unspecified back wall of thorax, initial encounter: Secondary | ICD-10-CM | POA: Diagnosis not present

## 2020-08-30 DIAGNOSIS — E1169 Type 2 diabetes mellitus with other specified complication: Secondary | ICD-10-CM | POA: Diagnosis not present

## 2020-09-14 DIAGNOSIS — M1990 Unspecified osteoarthritis, unspecified site: Secondary | ICD-10-CM | POA: Diagnosis not present

## 2020-09-14 DIAGNOSIS — E11649 Type 2 diabetes mellitus with hypoglycemia without coma: Secondary | ICD-10-CM | POA: Diagnosis not present

## 2020-09-14 DIAGNOSIS — I10 Essential (primary) hypertension: Secondary | ICD-10-CM | POA: Diagnosis not present

## 2020-09-14 DIAGNOSIS — E1169 Type 2 diabetes mellitus with other specified complication: Secondary | ICD-10-CM | POA: Diagnosis not present

## 2020-09-14 DIAGNOSIS — F3341 Major depressive disorder, recurrent, in partial remission: Secondary | ICD-10-CM | POA: Diagnosis not present

## 2020-09-14 DIAGNOSIS — E785 Hyperlipidemia, unspecified: Secondary | ICD-10-CM | POA: Diagnosis not present

## 2020-09-14 DIAGNOSIS — K219 Gastro-esophageal reflux disease without esophagitis: Secondary | ICD-10-CM | POA: Diagnosis not present

## 2020-10-06 DIAGNOSIS — G4733 Obstructive sleep apnea (adult) (pediatric): Secondary | ICD-10-CM | POA: Diagnosis not present

## 2020-10-06 DIAGNOSIS — I10 Essential (primary) hypertension: Secondary | ICD-10-CM | POA: Diagnosis not present

## 2020-10-06 DIAGNOSIS — Z7984 Long term (current) use of oral hypoglycemic drugs: Secondary | ICD-10-CM | POA: Diagnosis not present

## 2020-10-06 DIAGNOSIS — F3341 Major depressive disorder, recurrent, in partial remission: Secondary | ICD-10-CM | POA: Diagnosis not present

## 2020-10-06 DIAGNOSIS — E1169 Type 2 diabetes mellitus with other specified complication: Secondary | ICD-10-CM | POA: Diagnosis not present

## 2020-10-06 DIAGNOSIS — E785 Hyperlipidemia, unspecified: Secondary | ICD-10-CM | POA: Diagnosis not present

## 2020-10-21 DIAGNOSIS — S52531A Colles' fracture of right radius, initial encounter for closed fracture: Secondary | ICD-10-CM | POA: Diagnosis not present

## 2020-11-10 DIAGNOSIS — E1169 Type 2 diabetes mellitus with other specified complication: Secondary | ICD-10-CM | POA: Diagnosis not present

## 2020-11-10 DIAGNOSIS — M1990 Unspecified osteoarthritis, unspecified site: Secondary | ICD-10-CM | POA: Diagnosis not present

## 2020-11-10 DIAGNOSIS — K219 Gastro-esophageal reflux disease without esophagitis: Secondary | ICD-10-CM | POA: Diagnosis not present

## 2020-11-10 DIAGNOSIS — E11649 Type 2 diabetes mellitus with hypoglycemia without coma: Secondary | ICD-10-CM | POA: Diagnosis not present

## 2020-11-10 DIAGNOSIS — I10 Essential (primary) hypertension: Secondary | ICD-10-CM | POA: Diagnosis not present

## 2020-11-10 DIAGNOSIS — F3341 Major depressive disorder, recurrent, in partial remission: Secondary | ICD-10-CM | POA: Diagnosis not present

## 2020-11-10 DIAGNOSIS — E785 Hyperlipidemia, unspecified: Secondary | ICD-10-CM | POA: Diagnosis not present

## 2020-11-27 DIAGNOSIS — E11649 Type 2 diabetes mellitus with hypoglycemia without coma: Secondary | ICD-10-CM | POA: Diagnosis not present

## 2020-11-27 DIAGNOSIS — E785 Hyperlipidemia, unspecified: Secondary | ICD-10-CM | POA: Diagnosis not present

## 2020-11-27 DIAGNOSIS — I10 Essential (primary) hypertension: Secondary | ICD-10-CM | POA: Diagnosis not present

## 2020-11-27 DIAGNOSIS — E1169 Type 2 diabetes mellitus with other specified complication: Secondary | ICD-10-CM | POA: Diagnosis not present

## 2020-11-27 DIAGNOSIS — M1990 Unspecified osteoarthritis, unspecified site: Secondary | ICD-10-CM | POA: Diagnosis not present

## 2020-11-27 DIAGNOSIS — K219 Gastro-esophageal reflux disease without esophagitis: Secondary | ICD-10-CM | POA: Diagnosis not present

## 2020-11-27 DIAGNOSIS — F3341 Major depressive disorder, recurrent, in partial remission: Secondary | ICD-10-CM | POA: Diagnosis not present

## 2020-12-07 DIAGNOSIS — D225 Melanocytic nevi of trunk: Secondary | ICD-10-CM | POA: Diagnosis not present

## 2020-12-07 DIAGNOSIS — D2261 Melanocytic nevi of right upper limb, including shoulder: Secondary | ICD-10-CM | POA: Diagnosis not present

## 2020-12-07 DIAGNOSIS — D485 Neoplasm of uncertain behavior of skin: Secondary | ICD-10-CM | POA: Diagnosis not present

## 2020-12-07 DIAGNOSIS — L821 Other seborrheic keratosis: Secondary | ICD-10-CM | POA: Diagnosis not present

## 2020-12-07 DIAGNOSIS — L720 Epidermal cyst: Secondary | ICD-10-CM | POA: Diagnosis not present

## 2020-12-20 ENCOUNTER — Other Ambulatory Visit: Payer: Self-pay

## 2020-12-20 ENCOUNTER — Other Ambulatory Visit: Payer: Self-pay | Admitting: Internal Medicine

## 2020-12-20 ENCOUNTER — Ambulatory Visit
Admission: RE | Admit: 2020-12-20 | Discharge: 2020-12-20 | Disposition: A | Discharge status: Home / Self Care | Payer: Medicare PPO | Source: Ambulatory Visit | Attending: Internal Medicine | Admitting: Internal Medicine

## 2020-12-20 DIAGNOSIS — N1831 Chronic kidney disease, stage 3a: Secondary | ICD-10-CM | POA: Diagnosis not present

## 2020-12-20 DIAGNOSIS — M25559 Pain in unspecified hip: Secondary | ICD-10-CM | POA: Diagnosis not present

## 2020-12-20 DIAGNOSIS — M25551 Pain in right hip: Secondary | ICD-10-CM | POA: Diagnosis not present

## 2020-12-20 DIAGNOSIS — E1169 Type 2 diabetes mellitus with other specified complication: Secondary | ICD-10-CM | POA: Diagnosis not present

## 2020-12-20 DIAGNOSIS — Z7984 Long term (current) use of oral hypoglycemic drugs: Secondary | ICD-10-CM | POA: Diagnosis not present

## 2020-12-20 DIAGNOSIS — M25552 Pain in left hip: Secondary | ICD-10-CM | POA: Diagnosis not present

## 2020-12-21 DIAGNOSIS — G4733 Obstructive sleep apnea (adult) (pediatric): Secondary | ICD-10-CM | POA: Diagnosis not present

## 2020-12-30 DIAGNOSIS — L720 Epidermal cyst: Secondary | ICD-10-CM | POA: Diagnosis not present

## 2021-01-03 DIAGNOSIS — G4733 Obstructive sleep apnea (adult) (pediatric): Secondary | ICD-10-CM | POA: Diagnosis not present

## 2021-01-13 DIAGNOSIS — M25551 Pain in right hip: Secondary | ICD-10-CM | POA: Diagnosis not present

## 2021-01-13 DIAGNOSIS — M25552 Pain in left hip: Secondary | ICD-10-CM | POA: Diagnosis not present

## 2021-01-26 DIAGNOSIS — E669 Obesity, unspecified: Secondary | ICD-10-CM | POA: Diagnosis not present

## 2021-01-26 DIAGNOSIS — G8929 Other chronic pain: Secondary | ICD-10-CM | POA: Diagnosis not present

## 2021-01-26 DIAGNOSIS — E1122 Type 2 diabetes mellitus with diabetic chronic kidney disease: Secondary | ICD-10-CM | POA: Diagnosis not present

## 2021-01-26 DIAGNOSIS — E785 Hyperlipidemia, unspecified: Secondary | ICD-10-CM | POA: Diagnosis not present

## 2021-01-26 DIAGNOSIS — G4733 Obstructive sleep apnea (adult) (pediatric): Secondary | ICD-10-CM | POA: Diagnosis not present

## 2021-01-26 DIAGNOSIS — M1990 Unspecified osteoarthritis, unspecified site: Secondary | ICD-10-CM | POA: Diagnosis not present

## 2021-01-26 DIAGNOSIS — J45909 Unspecified asthma, uncomplicated: Secondary | ICD-10-CM | POA: Diagnosis not present

## 2021-01-26 DIAGNOSIS — F329 Major depressive disorder, single episode, unspecified: Secondary | ICD-10-CM | POA: Diagnosis not present

## 2021-01-26 DIAGNOSIS — N189 Chronic kidney disease, unspecified: Secondary | ICD-10-CM | POA: Diagnosis not present

## 2021-03-29 DIAGNOSIS — L708 Other acne: Secondary | ICD-10-CM | POA: Diagnosis not present

## 2021-04-14 DIAGNOSIS — Z0000 Encounter for general adult medical examination without abnormal findings: Secondary | ICD-10-CM | POA: Diagnosis not present

## 2021-04-14 DIAGNOSIS — Z7984 Long term (current) use of oral hypoglycemic drugs: Secondary | ICD-10-CM | POA: Diagnosis not present

## 2021-04-14 DIAGNOSIS — E559 Vitamin D deficiency, unspecified: Secondary | ICD-10-CM | POA: Diagnosis not present

## 2021-04-14 DIAGNOSIS — Z1159 Encounter for screening for other viral diseases: Secondary | ICD-10-CM | POA: Diagnosis not present

## 2021-04-14 DIAGNOSIS — F3341 Major depressive disorder, recurrent, in partial remission: Secondary | ICD-10-CM | POA: Diagnosis not present

## 2021-04-14 DIAGNOSIS — Z1389 Encounter for screening for other disorder: Secondary | ICD-10-CM | POA: Diagnosis not present

## 2021-04-14 DIAGNOSIS — I10 Essential (primary) hypertension: Secondary | ICD-10-CM | POA: Diagnosis not present

## 2021-04-14 DIAGNOSIS — E785 Hyperlipidemia, unspecified: Secondary | ICD-10-CM | POA: Diagnosis not present

## 2021-04-14 DIAGNOSIS — Z23 Encounter for immunization: Secondary | ICD-10-CM | POA: Diagnosis not present

## 2021-04-14 DIAGNOSIS — R202 Paresthesia of skin: Secondary | ICD-10-CM | POA: Diagnosis not present

## 2021-04-14 DIAGNOSIS — E1169 Type 2 diabetes mellitus with other specified complication: Secondary | ICD-10-CM | POA: Diagnosis not present

## 2021-04-14 DIAGNOSIS — J321 Chronic frontal sinusitis: Secondary | ICD-10-CM | POA: Diagnosis not present

## 2021-04-14 DIAGNOSIS — N1831 Chronic kidney disease, stage 3a: Secondary | ICD-10-CM | POA: Diagnosis not present

## 2021-05-11 ENCOUNTER — Ambulatory Visit
Admission: RE | Admit: 2021-05-11 | Discharge: 2021-05-11 | Disposition: A | Discharge status: Home / Self Care | Payer: Medicare PPO | Source: Ambulatory Visit | Attending: Internal Medicine | Admitting: Internal Medicine

## 2021-05-11 ENCOUNTER — Other Ambulatory Visit: Payer: Self-pay | Admitting: Internal Medicine

## 2021-05-11 DIAGNOSIS — R059 Cough, unspecified: Secondary | ICD-10-CM

## 2021-05-11 DIAGNOSIS — F3341 Major depressive disorder, recurrent, in partial remission: Secondary | ICD-10-CM | POA: Diagnosis not present

## 2021-05-11 DIAGNOSIS — I10 Essential (primary) hypertension: Secondary | ICD-10-CM | POA: Diagnosis not present

## 2021-05-11 DIAGNOSIS — E785 Hyperlipidemia, unspecified: Secondary | ICD-10-CM | POA: Diagnosis not present

## 2021-05-11 DIAGNOSIS — M1990 Unspecified osteoarthritis, unspecified site: Secondary | ICD-10-CM | POA: Diagnosis not present

## 2021-05-11 DIAGNOSIS — K219 Gastro-esophageal reflux disease without esophagitis: Secondary | ICD-10-CM | POA: Diagnosis not present

## 2021-05-11 DIAGNOSIS — E11649 Type 2 diabetes mellitus with hypoglycemia without coma: Secondary | ICD-10-CM | POA: Diagnosis not present

## 2021-05-11 DIAGNOSIS — E1169 Type 2 diabetes mellitus with other specified complication: Secondary | ICD-10-CM | POA: Diagnosis not present

## 2021-05-11 DIAGNOSIS — J329 Chronic sinusitis, unspecified: Secondary | ICD-10-CM | POA: Diagnosis not present

## 2021-06-28 DIAGNOSIS — E119 Type 2 diabetes mellitus without complications: Secondary | ICD-10-CM | POA: Diagnosis not present

## 2021-06-28 DIAGNOSIS — Z961 Presence of intraocular lens: Secondary | ICD-10-CM | POA: Diagnosis not present

## 2021-06-28 DIAGNOSIS — H524 Presbyopia: Secondary | ICD-10-CM | POA: Diagnosis not present

## 2021-06-28 DIAGNOSIS — H04123 Dry eye syndrome of bilateral lacrimal glands: Secondary | ICD-10-CM | POA: Diagnosis not present

## 2021-08-15 ENCOUNTER — Other Ambulatory Visit: Payer: Self-pay | Admitting: Internal Medicine

## 2021-08-15 DIAGNOSIS — R519 Headache, unspecified: Secondary | ICD-10-CM | POA: Diagnosis not present

## 2021-08-15 DIAGNOSIS — N1831 Chronic kidney disease, stage 3a: Secondary | ICD-10-CM | POA: Diagnosis not present

## 2021-08-15 DIAGNOSIS — R2689 Other abnormalities of gait and mobility: Secondary | ICD-10-CM | POA: Diagnosis not present

## 2021-08-15 DIAGNOSIS — I10 Essential (primary) hypertension: Secondary | ICD-10-CM | POA: Diagnosis not present

## 2021-08-15 DIAGNOSIS — E1169 Type 2 diabetes mellitus with other specified complication: Secondary | ICD-10-CM | POA: Diagnosis not present

## 2021-08-15 DIAGNOSIS — F3341 Major depressive disorder, recurrent, in partial remission: Secondary | ICD-10-CM | POA: Diagnosis not present

## 2021-08-18 ENCOUNTER — Ambulatory Visit
Admission: RE | Admit: 2021-08-18 | Discharge: 2021-08-18 | Disposition: A | Discharge status: Home / Self Care | Payer: Medicare PPO | Source: Ambulatory Visit | Attending: Internal Medicine | Admitting: Internal Medicine

## 2021-08-18 DIAGNOSIS — R2681 Unsteadiness on feet: Secondary | ICD-10-CM | POA: Diagnosis not present

## 2021-08-18 DIAGNOSIS — R42 Dizziness and giddiness: Secondary | ICD-10-CM | POA: Diagnosis not present

## 2021-08-18 DIAGNOSIS — R2689 Other abnormalities of gait and mobility: Secondary | ICD-10-CM

## 2021-09-14 DIAGNOSIS — F3341 Major depressive disorder, recurrent, in partial remission: Secondary | ICD-10-CM | POA: Diagnosis not present

## 2021-09-14 DIAGNOSIS — R55 Syncope and collapse: Secondary | ICD-10-CM | POA: Diagnosis not present

## 2021-09-14 DIAGNOSIS — N1831 Chronic kidney disease, stage 3a: Secondary | ICD-10-CM | POA: Diagnosis not present

## 2021-09-14 DIAGNOSIS — I6523 Occlusion and stenosis of bilateral carotid arteries: Secondary | ICD-10-CM | POA: Diagnosis not present

## 2021-09-14 DIAGNOSIS — I10 Essential (primary) hypertension: Secondary | ICD-10-CM | POA: Diagnosis not present

## 2021-09-14 DIAGNOSIS — M1990 Unspecified osteoarthritis, unspecified site: Secondary | ICD-10-CM | POA: Diagnosis not present

## 2021-09-14 DIAGNOSIS — Z9109 Other allergy status, other than to drugs and biological substances: Secondary | ICD-10-CM | POA: Diagnosis not present

## 2021-09-14 DIAGNOSIS — E1169 Type 2 diabetes mellitus with other specified complication: Secondary | ICD-10-CM | POA: Diagnosis not present

## 2021-09-15 DIAGNOSIS — M1990 Unspecified osteoarthritis, unspecified site: Secondary | ICD-10-CM | POA: Diagnosis not present

## 2021-09-15 DIAGNOSIS — E785 Hyperlipidemia, unspecified: Secondary | ICD-10-CM | POA: Diagnosis not present

## 2021-09-15 DIAGNOSIS — F3341 Major depressive disorder, recurrent, in partial remission: Secondary | ICD-10-CM | POA: Diagnosis not present

## 2021-09-15 DIAGNOSIS — E1169 Type 2 diabetes mellitus with other specified complication: Secondary | ICD-10-CM | POA: Diagnosis not present

## 2021-09-15 DIAGNOSIS — K219 Gastro-esophageal reflux disease without esophagitis: Secondary | ICD-10-CM | POA: Diagnosis not present

## 2021-09-15 DIAGNOSIS — I10 Essential (primary) hypertension: Secondary | ICD-10-CM | POA: Diagnosis not present

## 2021-09-30 IMAGING — MG MM DIGITAL DIAGNOSTIC UNILAT*L* W/ TOMO W/ CAD
6 series · 6 of 18 positions shown · non-contrast
Comparison: May 01, 2019

CLINICAL DATA: 68-year-old patient recalled from recent screening
mammogram for evaluation of a possible left breast mass.

EXAM:
DIGITAL DIAGNOSTIC LEFT MAMMOGRAM WITH TOMO
ULTRASOUND LEFT BREAST

[L CC synth-2D (1 of 2)]
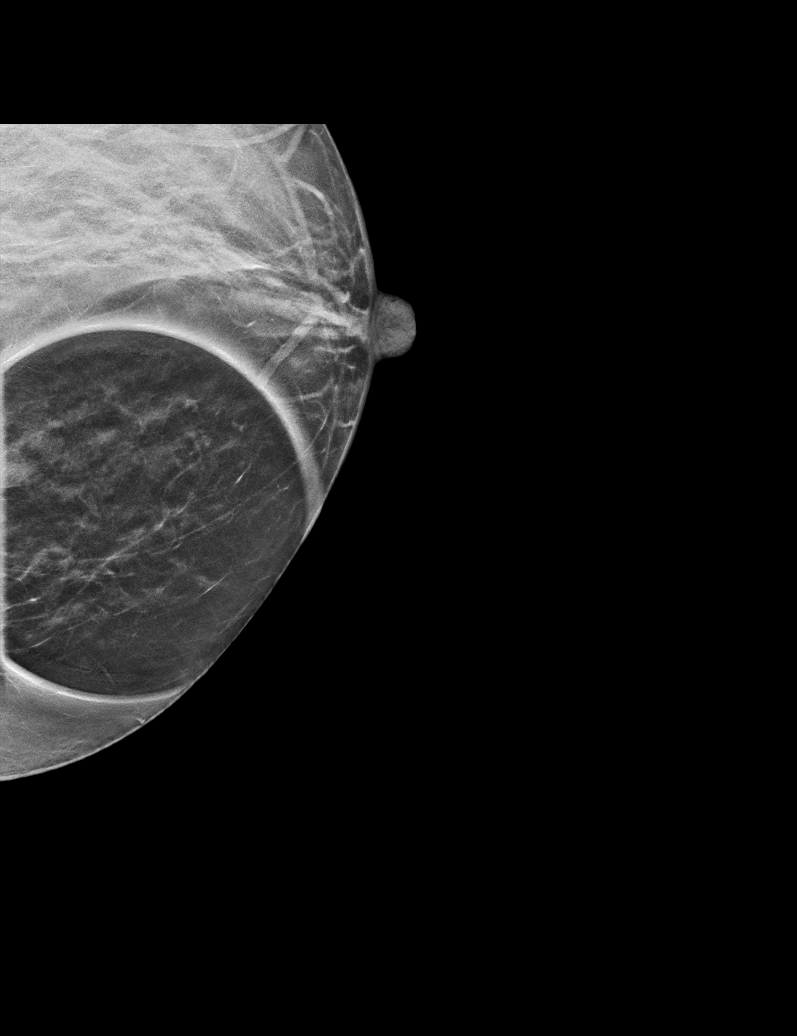

[L CC synth-2D (2 of 2)]
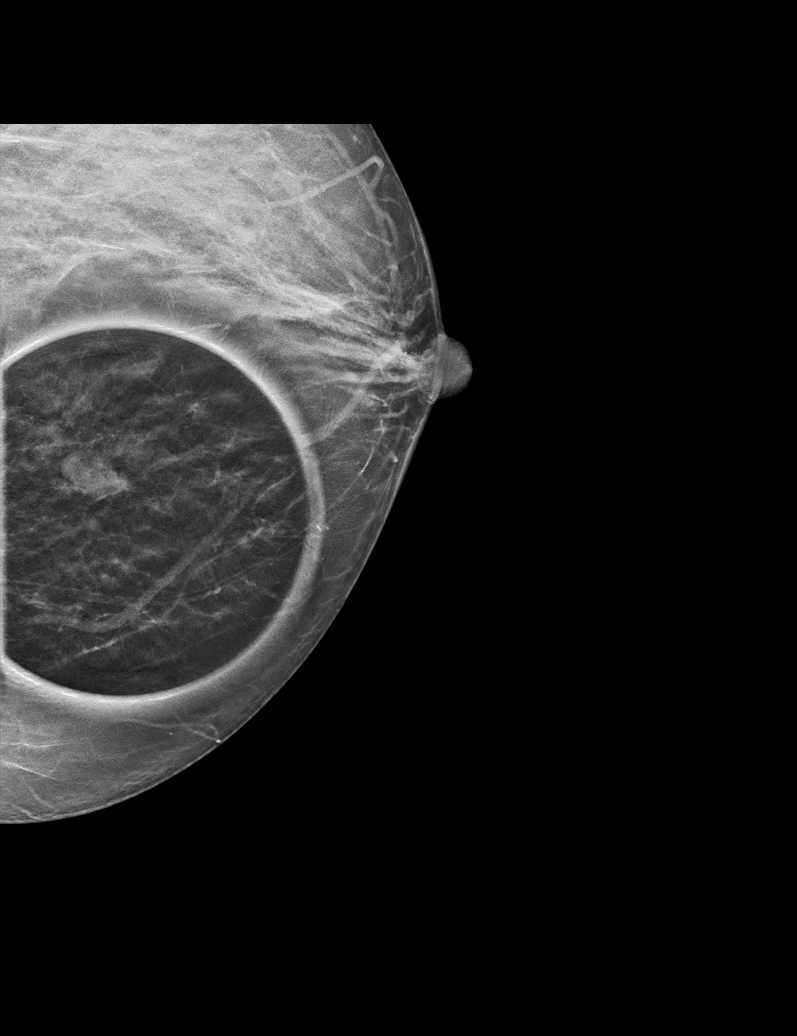

[L MLO synth-2D]
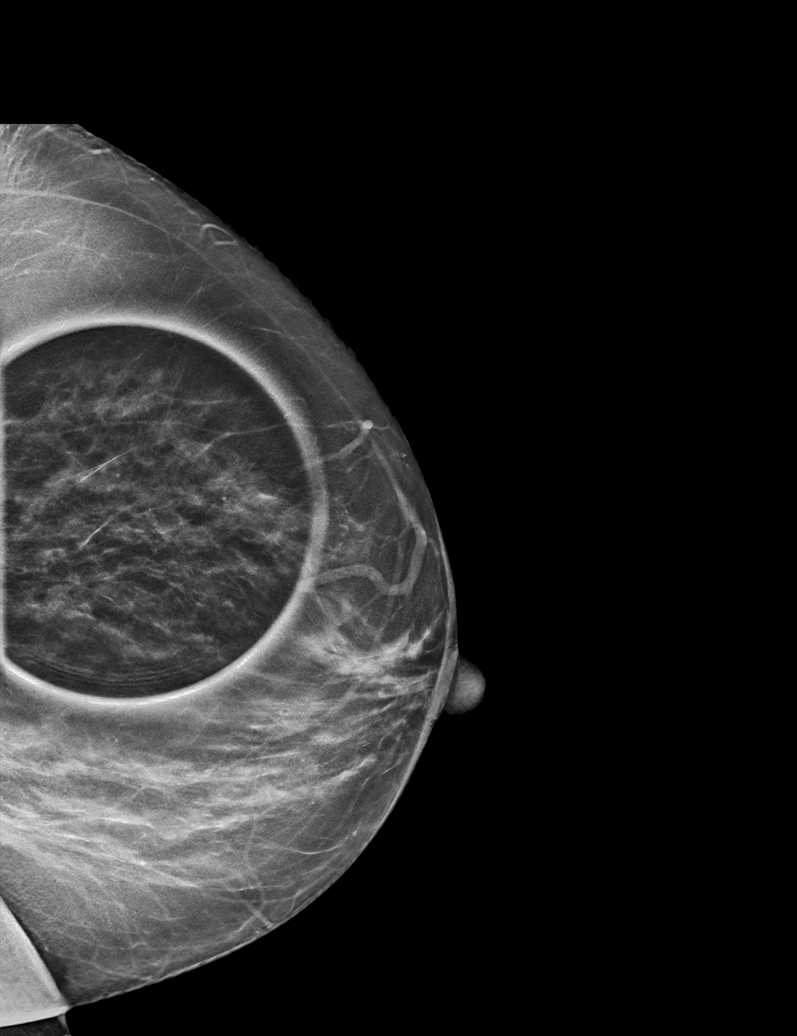

[L CC tomo (1 of 2) · tomo slice 19/36.0]
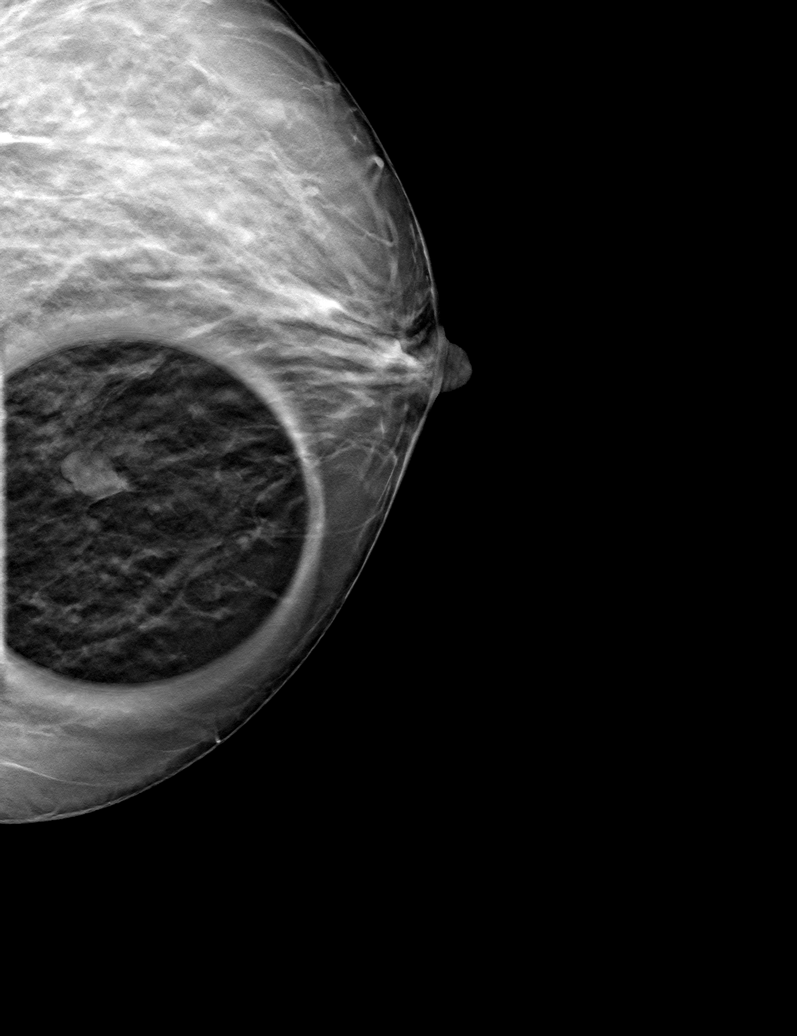

[L CC tomo (2 of 2) · tomo slice 17/34.0]
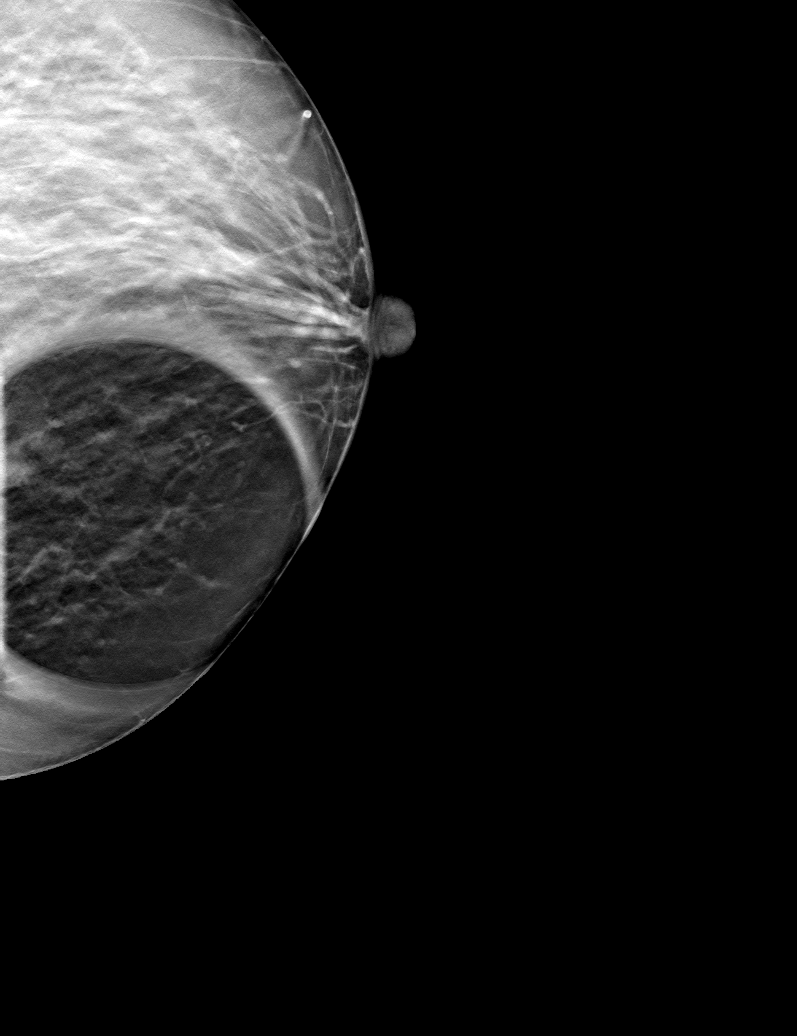

[L MLO tomo · tomo slice 23/44.0]
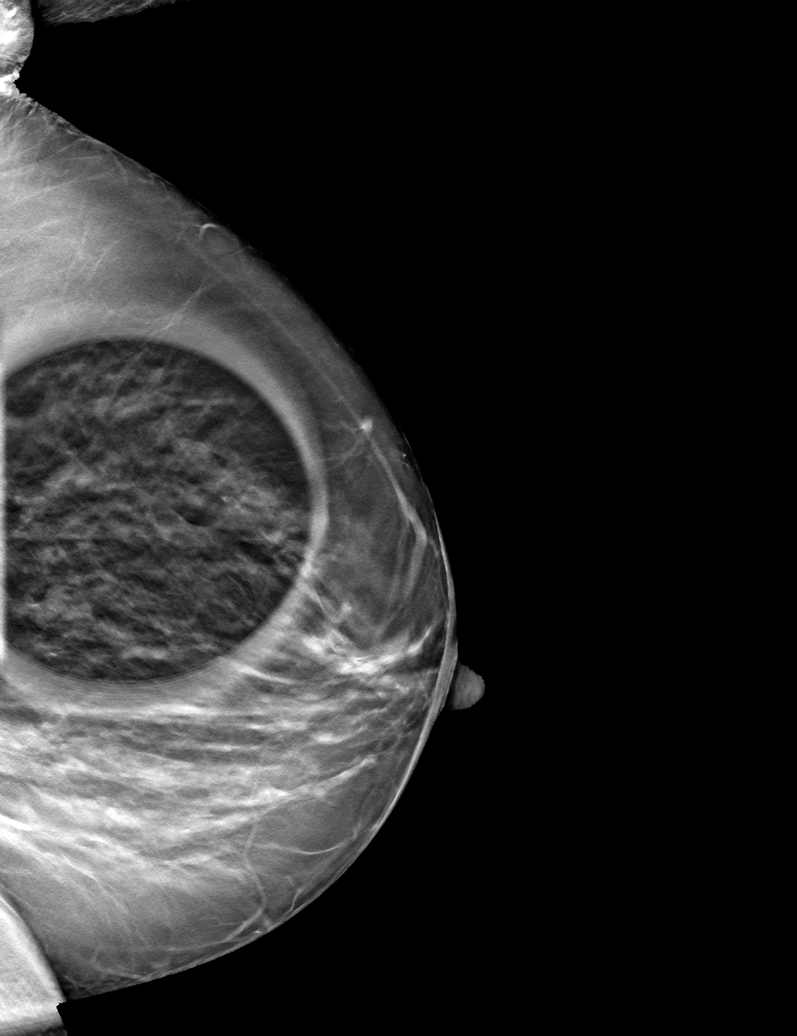

[6 of 18 positions shown; findings below may reference images not displayed]

ACR Breast Density Category c: The breast tissue is heterogeneously
dense, which may obscure small masses.
FINDINGS: Spot compression views of the upper inner left breast confirm a
circumscribed mass measuring approximately 1.2 cm, with a smaller
adjacent oval circumscribed mass.

Targeted ultrasound is performed, showing an oval simple cyst at 11
o'clock position 6 cm from the nipple measuring 1.2 x 1.1 x 0.5 cm.
Immediately adjacent to it is a smaller simple cyst measuring 0.50.6
x 0.3 cm. These account for the finding seen on the recent screening
mammogram.
IMPRESSION: Two benign cysts in the 11 o'clock position of the left breast 6 cm
from nipple. The largest measures 1.2 cm. No evidence of malignancy.

RECOMMENDATION:
Screening mammogram in one year.(Code:3O-M-GLJ)

I have discussed the findings and recommendations with the patient.
If applicable, a reminder letter will be sent to the patient
regarding the next appointment.

BI-RADS CATEGORY  2: Benign.

## 2021-09-30 IMAGING — US US BREAST*L* LIMITED INC AXILLA
1 series · 9 of 9 positions shown · non-contrast
Comparison: May 01, 2019

CLINICAL DATA: 68-year-old patient recalled from recent screening
mammogram for evaluation of a possible left breast mass.

EXAM:
DIGITAL DIAGNOSTIC LEFT MAMMOGRAM WITH TOMO
ULTRASOUND LEFT BREAST

[Series 1: us breast*left* limited inc axilla · 0.06mm/px · 9 of 9 slices shown]
[im 1/9]
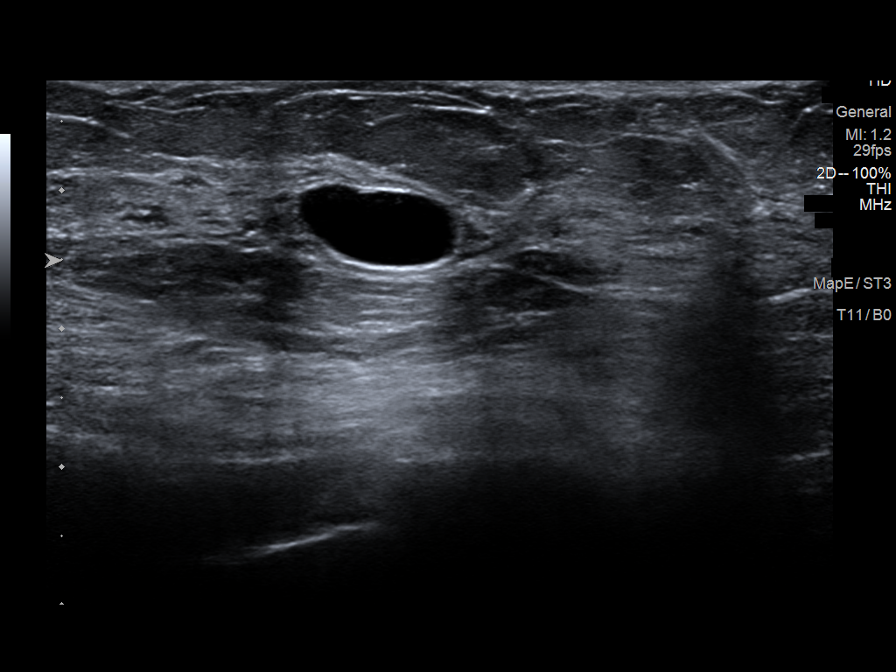
[im 2/9]
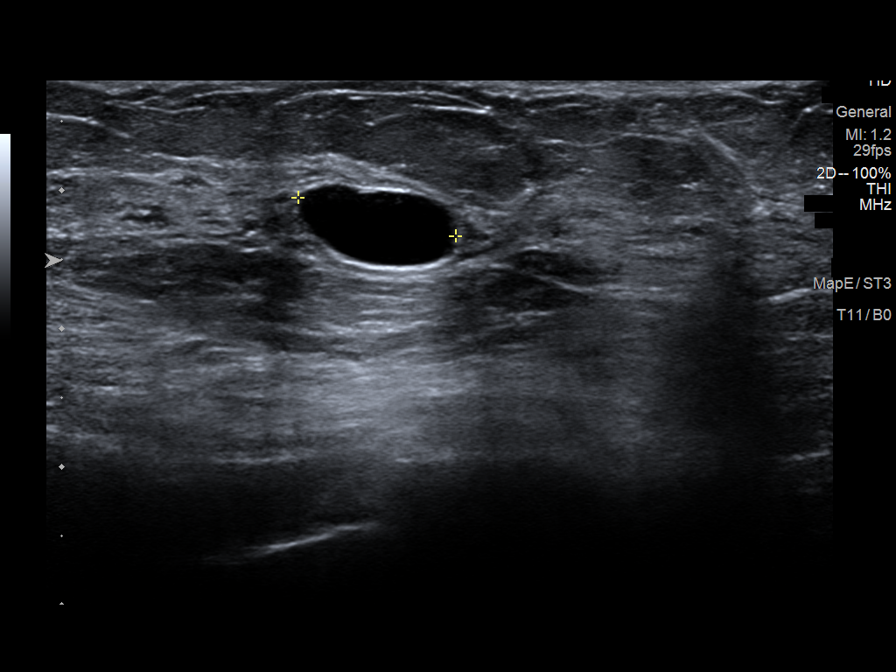
[im 3/9]
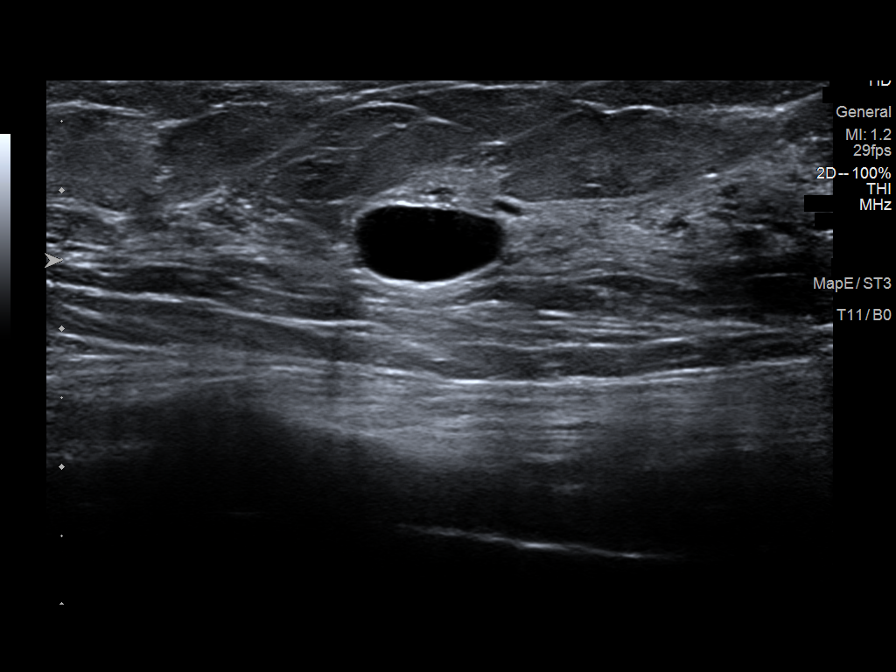
[im 4/9]
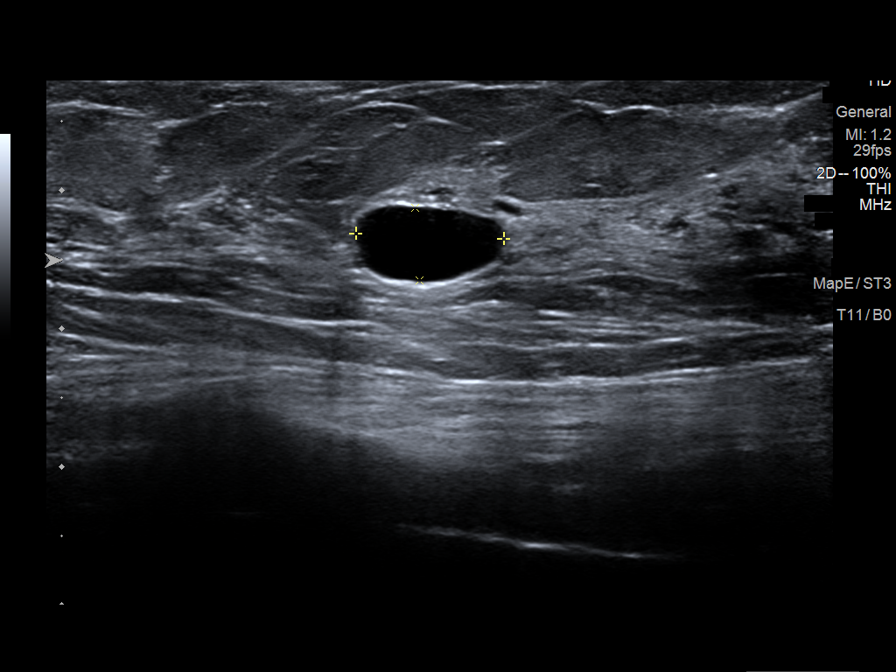
[im 5/9]
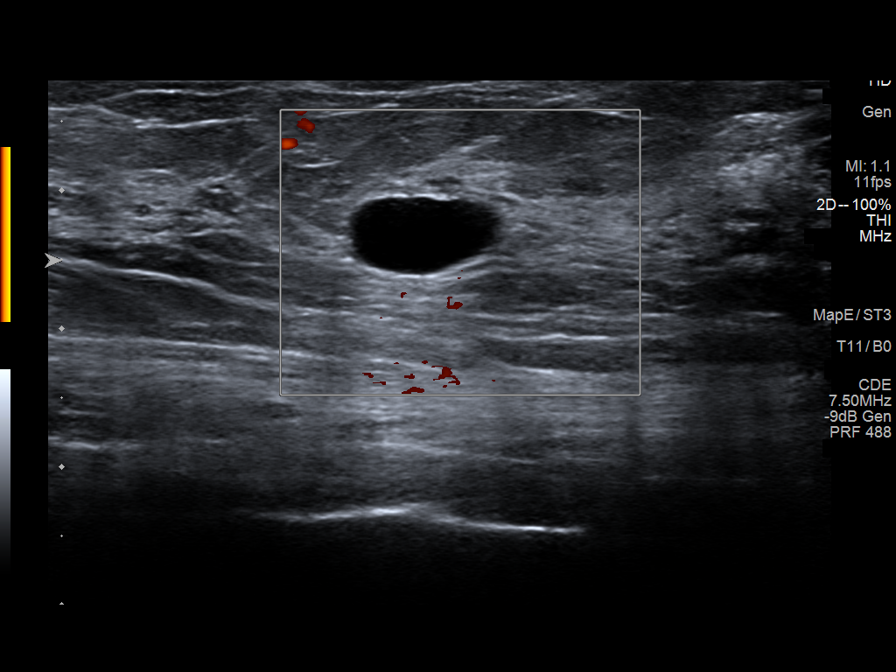
[im 6/9]
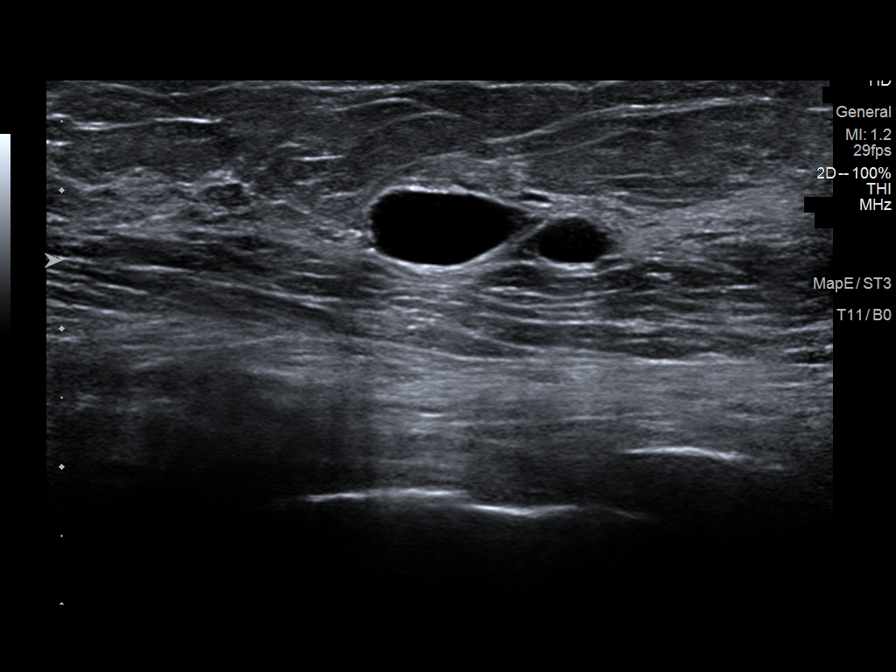
[im 7/9]
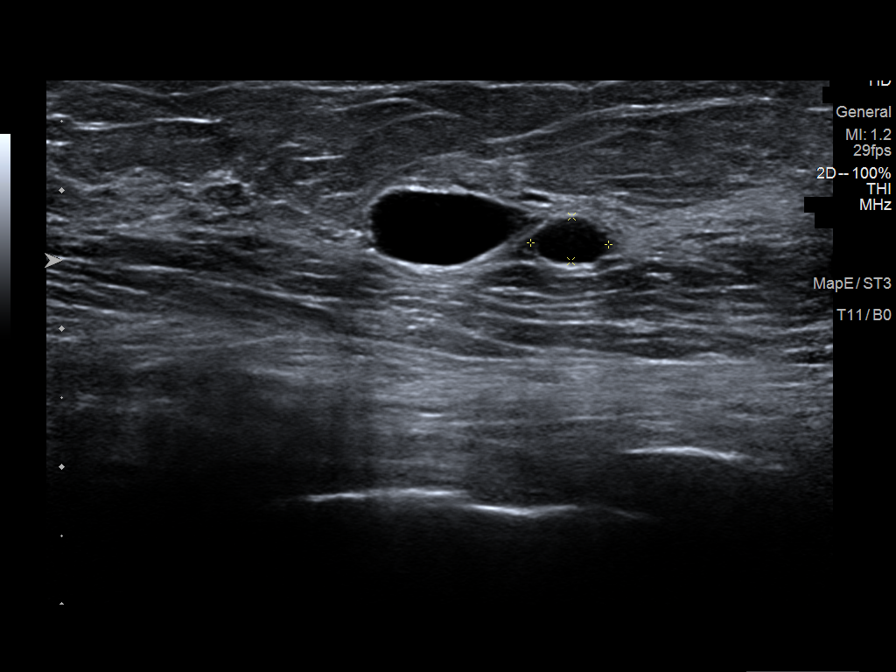
[im 8/9]
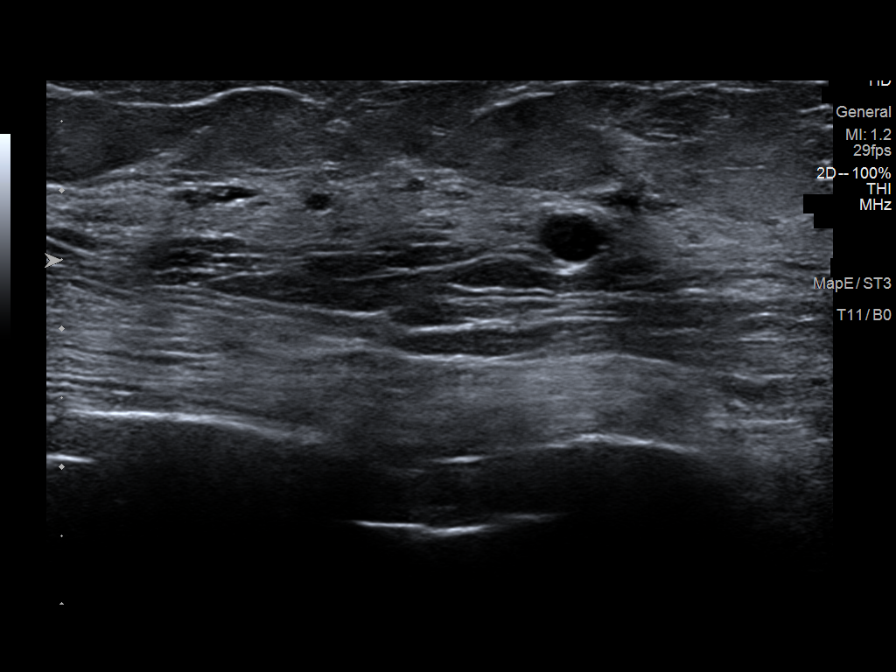
[im 9/9]
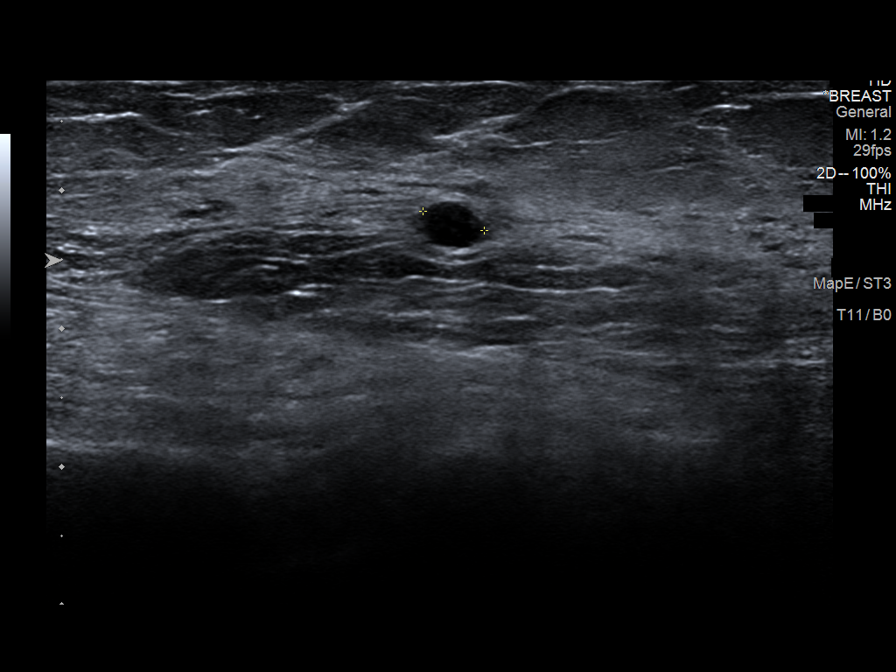

[9 of 9 positions shown; findings below may reference images not displayed]

ACR Breast Density Category c: The breast tissue is heterogeneously
dense, which may obscure small masses.
FINDINGS: Spot compression views of the upper inner left breast confirm a
circumscribed mass measuring approximately 1.2 cm, with a smaller
adjacent oval circumscribed mass.

Targeted ultrasound is performed, showing an oval simple cyst at 11
o'clock position 6 cm from the nipple measuring 1.2 x 1.1 x 0.5 cm.
Immediately adjacent to it is a smaller simple cyst measuring 0.50.6
x 0.3 cm. These account for the finding seen on the recent screening
mammogram.
IMPRESSION: Two benign cysts in the 11 o'clock position of the left breast 6 cm
from nipple. The largest measures 1.2 cm. No evidence of malignancy.

RECOMMENDATION:
Screening mammogram in one year.(Code:3O-M-GLJ)

I have discussed the findings and recommendations with the patient.
If applicable, a reminder letter will be sent to the patient
regarding the next appointment.

BI-RADS CATEGORY  2: Benign.

## 2021-10-14 NOTE — Therapy (Addendum)
OUTPATIENT PHYSICAL THERAPY LOWER EXTREMITY EVALUATION   Patient Name: Ariel Meyer MRN: [id number] DOB:1950/09/09, 71 y.o., female Today's Date: 10/14/2021     Past Medical History:  Diagnosis Date   Anxiety    Arthritis    Back problem    Chronic kidney disease    Complication of anesthesia    wakes up durning surgery   Depression    Diabetes mellitus without complication (HCC)    Dizziness    GERD (gastroesophageal reflux disease)    Hyperlipidemia    Hypertension    Pneumonia    Seasonal allergies    Shortness of breath dyspnea    Sleep apnea    Past Surgical History:  Procedure Laterality Date   BACK SURGERY     KYPHOPLASTY N/A 08/12/2012   Procedure: KYPHOPLASTY;  Surgeon: Clydene Fake, MD;  Location: [facility];  Service: Neurosurgery;  Laterality: N/A;  L1 Balloon Kyphoplasty   NASAL SINUS SURGERY     TONSILLECTOMY     TUBAL LIGATION     Patient Active Problem List   Diagnosis Date Noted   Upper airway cough syndrome 06/21/2015   Acute encephalopathy 05/10/2014   Alcohol withdrawal delirium (HCC) 05/10/2014   Difficult intubation    Acute respiratory failure with hypoxia (HCC)    Angioedema 05/06/2014   Goiter 05/06/2014   Hypertension 05/06/2014   Acute sinusitis 02/10/2014   Nausea with vomiting 02/10/2014   DM (diabetes mellitus), type 2 with renal complications (HCC) 02/08/2014   Renal insufficiency 02/07/2014   Acute renal failure (HCC) 02/07/2014   Enteritis due to Clostridium difficile 02/07/2014   Hypotension 02/07/2014   Benign hypertension 02/07/2014   Obesity, morbid (HCC) 02/07/2014   GERD (gastroesophageal reflux disease) 02/07/2014   Hyperlipidemia 02/07/2014   Sciatica 06/10/2012   Lumbar compression fracture (HCC) 06/10/2012    PCP: Lorenda Ishihara, MD  REFERRING PROVIDER: Lorenda Ishihara, MD  REFERRING DIAG: M25.552 (ICD-10-CM) - Pain in left hip  THERAPY DIAG:  No diagnosis found.  Rationale  for Evaluation and Treatment Rehabilitation  ONSET DATE: 5 years ago  SUBJECTIVE:   SUBJECTIVE STATEMENT: Pt reports primary c/o Rt>Lt lateral hip pain of insidious onset lasting about 5 years. She reports having three previous rounds of PT for this problem over the past five years, which she reports was not very helpful. She also reports having one corticosteroid injection in her Rt hip about 8 months ago, as well as two injections into her Lt hip over the past four. She reports the injections helped her Lt hip, but not her Rt hip. X-rays of her hips from August, 2022 were negative. She reports it is tender to the touch on the lateral aspect of her Lt hip. She also reports that the pain can sometimes travel to the front of her knee. Aggravating factors include walking/ standing, laying on either side, getting into/ out of her car, getting out of bed in the morning. Easing factors include rest, heat. Current pain is 5-6/10. Worst pain is 9-10/10. Best pain is 4/10. Pt denies any unexplained weight change, although she reports 25-lb weight loss over 1.5 years, which she reports is due to diet change in order to decrease her A1c levels. She also denies N/T, saddle anesthesia, or changes in bowel/ bladder function. Pt reports having nausea/ vomiting about 1 month ago due to medication change, although this has been solved.  PERTINENT HISTORY: Anxiety, depression, hx of L1 balloon kyphoplasty in 2014, HTN, DMII, kidney disease  PAIN:  Are you having pain? Yes: NPRS scale: 5-6/10 Pain location: Lt lateral hip Pain description: achy, sharp Aggravating factors: walking/ standing, laying on either side, getting into/ out of her car, getting out of bed in the morning Relieving factors: rest, heat  PRECAUTIONS: None  WEIGHT BEARING RESTRICTIONS No  FALLS:  Has patient fallen in last 6 months? No  LIVING ENVIRONMENT: Lives with: lives with her family Lives in: House/apartment Stairs: Yes: Internal:  15 steps; on right going up, on left going up, and can reach both and External: 4 steps; on right going up, on left going up, and can reach both Has following equipment at home: Single point cane  OCCUPATION: Retired  PLOF: Independent  PATIENT GOALS Walk, exercise with less pain  Screening for Suicide  Answer the following questions with Yes or No and place an "x" beside the action taken.  1. Over the past two weeks, have you felt down, depressed, or hopeless?   Yes  2. Within the past two weeks, have you felt little interest or pleasure in life?  No  If YES to either #1 or #2, then ask #3  3. Have you had thoughts that life is not worth living or that you might be       better off dead?   NO  If answer is NO and suspicion is low, then end   4. Over this past week, have you had any thoughts about hurting or even killing yourself?    If NO, then end. Patient in no immediate danger   5. If so, do you believe that you intend to or will harm yourself?       If NO, then end. Patient in no immediate danger   6.  Do you have a plan as to how you would hurt yourself?     7.  Over this past week, have you actually done anything to hurt yourself?    IF YES answers to either #4, #5, #6 or #7, then patient is AT RISK for suicide   Actions Taken  __X__  Screening negative; no further action required  ____  Screening positive; no immediate danger and patient already in treatment with a  mental health provider. Advise patient to speak to their mental health provider.  ____  Screening positive; no immediate danger. Patient advised to contact a mental  health provider for further assessment.   ____  Screening positive; in immediate danger as patient states intention of killing self,  has plan and a sense of imminence. Do not leave alone. Seek permission from  patient to contact a family member to inform them. Direct patient to go to ED.   OBJECTIVE:   DIAGNOSTIC FINDINGS:  12/20/2020: DG Hips Bilat with or without Pelvis 3-4 Views: IMPRESSION: Negative.  PATIENT SURVEYS:  FOTO 36%, predicted 51% in 13 visits  COGNITION:  Overall cognitive status: Within functional limits for tasks assessed     SENSATION: Not tested   MUSCLE LENGTH: Ober's test: (+) BIL Thomas test: (+)  POSTURE: weight shift left  PALPATION: TTP to Lt/ Rt greater trochanter, Rt glute med  LOWER EXTREMITY ROM:  A/PROM Right eval Left eval  Hip flexion 95/110p! 90/110p!  Hip abduction 22/30p! 28/32p!  Hip adduction 17p! 15p!  Hip internal rotation 20/32p! 32/38p!  Hip external rotation 30/42 28/36   (Blank rows = not tested)  LOWER EXTREMITY MMT:  MMT Right eval Left eval  Hip flexion 4/5 4/5  Hip extension 3/5 3/5  Hip abduction 3/5p! 3/5p!  Hip internal rotation 5/5 4+/5p!  Hip external rotation 5/5 5/5  Knee flexion 5/5 5/5  Knee extension 5/5 5/5   (Blank rows = not tested)  LOWER EXTREMITY SPECIAL TESTS:  FABER: (+) on Rt FADDIR: (-) Trendelenburg sign: (+) BIL, Lt>Rt  FUNCTIONAL TESTS:  5xSTS: 38 seconds with UE support and increased BIL knee and Lt hip pain Squat: 50%, BIL knee crepitus throughout, pain in Lt hip SLS: Unable to perform without UE support  GAIT: Distance walked: 4720ft Assistive device utilized: None Level of assistance: Complete Independence Comments: Rt antalgic gait with off-loading to Lt and decreased stance on Rt    TODAY'S TREATMENT: 10/17/2021: demonstrated and issued HEP   PATIENT EDUCATION:  Education details: Pt educated on probable underlying pathophysiology behind her pain presentation, POC, prognosis, FOTO, and HEP Person educated: Patient Education method: Explanation, Demonstration, and Handouts Education comprehension: verbalized understanding and returned demonstration   HOME EXERCISE PROGRAM: Access Code: [email or web address] URL: [email or web address] Date: 10/17/2021 Prepared by: Carmelina Daneucker  Fernande Treiber  Exercises - Standing ITB Stretch  - 1 x daily - 7 x weekly - 2-min hold - Seated Piriformis Stretch  - 1 x daily - 7 x weekly - 2-min hold - Hip Flexor Stretch at Edge of Bed  - 1 x daily - 7 x weekly - 2-min hold - Supine Bridge  - 1 x daily - 7 x weekly - 3 sets - 10 reps - 3-sec hold - Sidelying Hip Abduction  - 1 x daily - 7 x weekly - 3 sets - 10 reps - 3-sec hold  ASSESSMENT:  CLINICAL IMPRESSION: Patient is a 71 y.o. F who was seen today for physical therapy evaluation and treatment for chronic BIL Rt>Lt lateral hip pain. Upon assessment, pt's primary impairments include tight BIL IT band and hip flexors, TTP to Lt/ Rt greater trochanter and Rt glute med, weak and painful BIL global hip MMT, limited and painful BIL hip A/PROM, painful and limited functional squat, and decreased 5xSTS. Ruling Up BIL Rt>Lt IT band syndrome due to positive Ober's BIL, TTP to BIL greater trochanter and Rt glute med, report of lateral hip pain, and negative BIL hip X-ray results. Pt will benefit from skilled PT to address her primary impairments and return to her prior level of function with less limitation.   OBJECTIVE IMPAIRMENTS Abnormal gait, decreased activity tolerance, decreased balance, decreased endurance, decreased mobility, difficulty walking, decreased ROM, decreased strength, hypomobility, increased edema, impaired flexibility, improper body mechanics, postural dysfunction, and pain.   ACTIVITY LIMITATIONS carrying, lifting, bending, sitting, standing, squatting, sleeping, stairs, transfers, bed mobility, dressing, and locomotion level  PARTICIPATION LIMITATIONS: meal prep, cleaning, laundry, interpersonal relationship, driving, shopping, community activity, and yard work  PERSONAL FACTORS Past/current experiences, Time since onset of injury/illness/exacerbation, and 3+ comorbidities: See medical hx  are also affecting patient's functional outcome.   REHAB POTENTIAL: Fair Due to time  since onset of sxs, hx of failed PT, multiple comorbidities  CLINICAL DECISION MAKING: Evolving/moderate complexity  EVALUATION COMPLEXITY: Moderate   GOALS: Goals reviewed with patient? Yes  SHORT TERM GOALS: Target date: 11/14/2021   Pt will report understanding and adherence to initial HEP in order to promote independence in the management of primary impairments. Baseline: HEP provided at eval Goal status: INITIAL   LONG TERM GOALS: Target date: 12/13/2021   Pt will achieve a FOTO score of 51% in order to demonstrate improved functional ability as it relates to her primary impairments. Baseline: 36% Goal  status: INITIAL  2.  Pt will achieve a 5xSTS without use of UE in 20 seconds or less in order to promote safe transfers. Baseline: 38 seconds with UE support Goal status: INITIAL  3.  Pt will achieve 4+/5 global BIL hip strength in order to progress her exercise regimen with less limitation. Baseline: See MMT chart Goal status: INITIAL  4.  Pt will report ability to walk/ stand >10 minutes with 0-3/10 pain in order to grocery shop with less limitation. Baseline: Unable to walk/ stand any amount of time (>6/10 pain) Goal status: INITIAL    PLAN: PT FREQUENCY: 2x/week  PT DURATION: 8 weeks  PLANNED INTERVENTIONS: Therapeutic exercises, Therapeutic activity, Neuromuscular re-education, Balance training, Gait training, Patient/Family education, Joint manipulation, Joint mobilization, Stair training, DME instructions, Aquatic Therapy, Dry Needling, Electrical stimulation, Spinal mobilization, Cryotherapy, Moist heat, Taping, Vasopneumatic device, Biofeedback, Ionotophoresis 4mg /ml Dexamethasone, Manual therapy, and Re-evaluation  PLAN FOR NEXT SESSION: Progress hip stretching/ strengthening, perform brief lumbar assessment as time allows  Referring diagnosis? M25.552 (ICD-10-CM) - Pain in left hip Treatment diagnosis? (if different than referring diagnosis) Pain in right hip,  pain in left hip, difficulty in walking, not elsewhere classified What was this (referring dx) caused by? []  Surgery []  Fall [x]  Ongoing issue []  Arthritis []  Other: ____________  Laterality: []  Rt []  Lt [x]  Both  Check all possible CPT codes:  *CHOOSE 10 OR LESS*    [x]  97110 (Therapeutic Exercise)  [x]  92507 (SLP Treatment)  [x]  97112 (Neuro Re-ed)   []  92526 (Swallowing Treatment)   [x]  (Gait Training)   [x]  (Cognitive Training, 1st 15 minutes) [x]  (Manual Therapy)   []  97130 (Cognitive Training, each add'l 15 minutes)  [x]  97164 (Re-evaluation)                              [x]  Other, List CPT Code ____________  [x]  97530 (Therapeutic Activities)     [x]  97535 (Self Care)   []  All codes above (97110 - 97535)  []  97012 (Mechanical Traction)  [x]  97014 (E-stim Unattended)  [x]  97032 (E-stim manual)  [x]  97033 (Ionto)  []  97035 (Ultrasound)  []  97760 (Orthotic Fit) []  97750 (Physical Performance Training) [x]  (Aquatic Therapy) []  97034 (Contrast Bath) []  97018 (Paraffin) []  97597 (Wound Care 1st 20 sq cm) []  97598 (Wound Care each add'l 20 sq cm) [x]  97016 (Vasopneumatic Device) []  (Orthotic Training) []  (Prosthetic Training)   , PT, DPT 10/14/21 9:19 AM  , PT, DPT 10/19/21 9:27 AM

## 2021-10-17 ENCOUNTER — Ambulatory Visit: Payer: Medicare PPO | Attending: Internal Medicine

## 2021-10-17 DIAGNOSIS — M25552 Pain in left hip: Secondary | ICD-10-CM

## 2021-10-17 DIAGNOSIS — M6281 Muscle weakness (generalized): Secondary | ICD-10-CM

## 2021-10-17 DIAGNOSIS — M25551 Pain in right hip: Secondary | ICD-10-CM

## 2021-10-17 DIAGNOSIS — R262 Difficulty in walking, not elsewhere classified: Secondary | ICD-10-CM

## 2021-10-25 ENCOUNTER — Ambulatory Visit: Payer: Medicare PPO

## 2021-10-25 DIAGNOSIS — M6281 Muscle weakness (generalized): Secondary | ICD-10-CM

## 2021-10-25 DIAGNOSIS — M25552 Pain in left hip: Secondary | ICD-10-CM

## 2021-10-25 DIAGNOSIS — R262 Difficulty in walking, not elsewhere classified: Secondary | ICD-10-CM | POA: Diagnosis not present

## 2021-10-25 DIAGNOSIS — M25551 Pain in right hip: Secondary | ICD-10-CM | POA: Diagnosis not present

## 2021-10-25 NOTE — Therapy (Signed)
OUTPATIENT PHYSICAL THERAPY TREATMENT NOTE   Patient Name: Ariel Meyer MRN: [id number] DOB:01/21/1951, 71 y.o., female Today's Date: 10/25/2021  PCP: Lorenda IshiharaVaradarajan, Rupashree, MD REFERRING PROVIDER: Lorenda IshiharaVaradarajan, Rupashree, MD  END OF SESSION:   PT End of Session - 10/25/21 1526     Visit Number 2    Number of Visits 17    Date for PT Re-Evaluation 12/19/21    Authorization Type [employer] MCR    Authorization Time Period FOTO v6, v10, kx mod v15    Progress Note Due on Visit 10    PT Start Time 1530    PT Stop Time 1615    PT Time Calculation (min) 45 min    Activity Tolerance Patient tolerated treatment well    Behavior During Therapy WFL for tasks assessed/performed             Past Medical History:  Diagnosis Date   Anxiety    Arthritis    Back problem    Chronic kidney disease    Complication of anesthesia    wakes up durning surgery   Depression    Diabetes mellitus without complication (HCC)    Dizziness    GERD (gastroesophageal reflux disease)    Hyperlipidemia    Hypertension    Pneumonia    Seasonal allergies    Shortness of breath dyspnea    Sleep apnea    Past Surgical History:  Procedure Laterality Date   BACK SURGERY     KYPHOPLASTY N/A 08/12/2012   Procedure: KYPHOPLASTY;  Surgeon: Clydene FakeJames R Hirsch, MD;  Location: [facility];  Service: Neurosurgery;  Laterality: N/A;  L1 Balloon Kyphoplasty   NASAL SINUS SURGERY     TONSILLECTOMY     TUBAL LIGATION     Patient Active Problem List   Diagnosis Date Noted   Upper airway cough syndrome 06/21/2015   Acute encephalopathy 05/10/2014   Alcohol withdrawal delirium (HCC) 05/10/2014   Difficult intubation    Acute respiratory failure with hypoxia (HCC)    Angioedema 05/06/2014   Goiter 05/06/2014   Hypertension 05/06/2014   Acute sinusitis 02/10/2014   Nausea with vomiting 02/10/2014   DM (diabetes mellitus), type 2 with renal complications (HCC) 02/08/2014   Renal insufficiency  02/07/2014   Acute renal failure (HCC) 02/07/2014   Enteritis due to Clostridium difficile 02/07/2014   Hypotension 02/07/2014   Benign hypertension 02/07/2014   Obesity, morbid (HCC) 02/07/2014   GERD (gastroesophageal reflux disease) 02/07/2014   Hyperlipidemia 02/07/2014   Sciatica 06/10/2012   Lumbar compression fracture (HCC) 06/10/2012    REFERRING DIAG: M25.552 (ICD-10-CM) - Pain in left hip   THERAPY DIAG:  Pain in right hip  Pain in left hip  Difficulty in walking, not elsewhere classified  Muscle weakness (generalized)  Rationale for Evaluation and Treatment Rehabilitation  PERTINENT HISTORY: Anxiety, depression, hx of L1 balloon kyphoplasty in 2014, HTN, DMII, kidney disease  PRECAUTIONS: None  SUBJECTIVE: Patient report that things like getting out of bed and climbing stairs exacerbate her pain.  PAIN:  Are you having pain? Yes: NPRS scale: 7/10 Pain location: Lt lateral hip Pain description: achy, sharp Aggravating factors: walking/ standing, laying on either side, getting into/ out of her car, getting out of bed in the morning Relieving factors: rest, heat   OBJECTIVE: (objective measures completed at initial evaluation unless otherwise dated)   DIAGNOSTIC FINDINGS: 12/20/2020: DG Hips Bilat with or without Pelvis 3-4 Views: IMPRESSION: Negative.   PATIENT SURVEYS:  FOTO 36%, predicted 51% in  13 visits   COGNITION:           Overall cognitive status: Within functional limits for tasks assessed                          SENSATION: Not tested     MUSCLE LENGTH: Ober's test: (+) BIL Thomas test: (+)   POSTURE: weight shift left   PALPATION: TTP to Lt/ Rt greater trochanter, Rt glute med   LOWER EXTREMITY ROM:   A/PROM Right eval Left eval  Hip flexion 95/110p! 90/110p!  Hip abduction 22/30p! 28/32p!  Hip adduction 17p! 15p!  Hip internal rotation 20/32p! 32/38p!  Hip external rotation 30/42 28/36   (Blank rows = not tested)   LOWER  EXTREMITY MMT:   MMT Right eval Left eval  Hip flexion 4/5 4/5  Hip extension 3/5 3/5  Hip abduction 3/5p! 3/5p!  Hip internal rotation 5/5 4+/5p!  Hip external rotation 5/5 5/5  Knee flexion 5/5 5/5  Knee extension 5/5 5/5   (Blank rows = not tested)   LOWER EXTREMITY SPECIAL TESTS:  FABER: (+) on Rt FADDIR: (-) Trendelenburg sign: (+) BIL, Lt>Rt   FUNCTIONAL TESTS:  5xSTS: 38 seconds with UE support and increased BIL knee and Lt hip pain Squat: 50%, BIL knee crepitus throughout, pain in Lt hip SLS: Unable to perform without UE support   GAIT: Distance walked: 101ft Assistive device utilized: None Level of assistance: Complete Independence Comments: Rt antalgic gait with off-loading to Lt and decreased stance on Rt       TODAY'S TREATMENT: [facility] Adult PT Treatment:                                                DATE: 10/25/2021 Therapeutic Exercise: Nustep level 5 x 5 mins Standing marching 2x10 BIL Standing hip abduction 2x10 BIL Standing hip extension 2x10 BIL Mini squats 2x10 Bridges 2x10 Supine hip adduction ball squeeze 5" hold 2x10 Supine clamshell GTB 2x10 Supine figure 4 piriformis stretch 2x30" BIL Modified thomas stretch EOM x1' BIL   10/17/2021: demonstrated and issued HEP     PATIENT EDUCATION:  Education details: Pt educated on probable underlying pathophysiology behind her pain presentation, POC, prognosis, FOTO, and HEP Person educated: Patient Education method: Explanation, Demonstration, and Handouts Education comprehension: verbalized understanding and returned demonstration     HOME EXERCISE PROGRAM: Access Code: [email or web address] URL: [email or web address] Date: 10/17/2021 Prepared by: Carmelina Dane   Exercises - Standing ITB Stretch  - 1 x daily - 7 x weekly - 2-min hold - Seated Piriformis Stretch  - 1 x daily - 7 x weekly - 2-min hold - Hip Flexor Stretch at Edge of Bed  - 1 x daily - 7 x weekly - 2-min hold - Supine  Bridge  - 1 x daily - 7 x weekly - 3 sets - 10 reps - 3-sec hold - Sidelying Hip Abduction  - 1 x daily - 7 x weekly - 3 sets - 10 reps - 3-sec hold   ASSESSMENT:   CLINICAL IMPRESSION: Patient presents to PT with high levels of pain in her R hip and reports HEP compliance. She brought her laptop with her today to provide an updated list of medications. Session today focused on proximal hip strengthening as well as stretching and improving standing activity tolerance. She  did need an occasional rest break during the session. Patient was able to tolerate all prescribed exercises with no adverse effects. Patient continues to benefit from skilled PT services and should be progressed as able to improve functional independence.      OBJECTIVE IMPAIRMENTS Abnormal gait, decreased activity tolerance, decreased balance, decreased endurance, decreased mobility, difficulty walking, decreased ROM, decreased strength, hypomobility, increased edema, impaired flexibility, improper body mechanics, postural dysfunction, and pain.    ACTIVITY LIMITATIONS carrying, lifting, bending, sitting, standing, squatting, sleeping, stairs, transfers, bed mobility, dressing, and locomotion level   PARTICIPATION LIMITATIONS: meal prep, cleaning, laundry, interpersonal relationship, driving, shopping, community activity, and yard work   PERSONAL FACTORS Past/current experiences, Time since onset of injury/illness/exacerbation, and 3+ comorbidities: See medical hx  are also affecting patient's functional outcome.    REHAB POTENTIAL: Fair Due to time since onset of sxs, hx of failed PT, multiple comorbidities   CLINICAL DECISION MAKING: Evolving/moderate complexity   EVALUATION COMPLEXITY: Moderate     GOALS: Goals reviewed with patient? Yes   SHORT TERM GOALS: Target date: 11/14/2021    Pt will report understanding and adherence to initial HEP in order to promote independence in the management of primary  impairments. Baseline: HEP provided at eval Goal status: INITIAL     LONG TERM GOALS: Target date: 12/13/2021    Pt will achieve a FOTO score of 51% in order to demonstrate improved functional ability as it relates to her primary impairments. Baseline: 36% Goal status: INITIAL   2.  Pt will achieve a 5xSTS without use of UE in 20 seconds or less in order to promote safe transfers. Baseline: 38 seconds with UE support Goal status: INITIAL   3.  Pt will achieve 4+/5 global BIL hip strength in order to progress her exercise regimen with less limitation. Baseline: See MMT chart Goal status: INITIAL   4.  Pt will report ability to walk/ stand >10 minutes with 0-3/10 pain in order to grocery shop with less limitation. Baseline: Unable to walk/ stand any amount of time (>6/10 pain) Goal status: INITIAL       PLAN: PT FREQUENCY: 2x/week   PT DURATION: 8 weeks   PLANNED INTERVENTIONS: Therapeutic exercises, Therapeutic activity, Neuromuscular re-education, Balance training, Gait training, Patient/Family education, Joint manipulation, Joint mobilization, Stair training, DME instructions, Aquatic Therapy, Dry Needling, Electrical stimulation, Spinal mobilization, Cryotherapy, Moist heat, Taping, Vasopneumatic device, Biofeedback, Ionotophoresis 4mg /ml Dexamethasone, Manual therapy, and Re-evaluation   PLAN FOR NEXT SESSION: Progress hip stretching/ strengthening, perform brief lumbar assessment as time allows    , PTA 10/25/2021, 4:20 PM

## 2021-10-28 ENCOUNTER — Ambulatory Visit: Payer: Medicare PPO

## 2021-10-28 DIAGNOSIS — M25552 Pain in left hip: Secondary | ICD-10-CM

## 2021-10-28 DIAGNOSIS — R262 Difficulty in walking, not elsewhere classified: Secondary | ICD-10-CM | POA: Diagnosis not present

## 2021-10-28 DIAGNOSIS — M25551 Pain in right hip: Secondary | ICD-10-CM | POA: Diagnosis not present

## 2021-10-28 DIAGNOSIS — M6281 Muscle weakness (generalized): Secondary | ICD-10-CM | POA: Diagnosis not present

## 2021-10-28 NOTE — Therapy (Signed)
OUTPATIENT PHYSICAL THERAPY TREATMENT NOTE   Patient Name: Thelda Gagan MRN: [id number] DOB:[birth year], 71 y.o., female Today's Date: 10/28/2021  PCP: Lorenda Ishihara, MD REFERRING PROVIDER: Lorenda Ishihara, MD  END OF SESSION:   PT End of Session - 10/28/21 1036     Visit Number 3    Number of Visits 17    Date for PT Re-Evaluation 12/19/21    Authorization Type [employer] MCR    Authorization Time Period FOTO v6, v10, kx mod v15    Progress Note Due on Visit 10    PT Start Time 1036   Pt arrived 6 mins late to appt   PT Stop Time 1115    PT Time Calculation (min) 39 min    Activity Tolerance Patient tolerated treatment well    Behavior During Therapy WFL for tasks assessed/performed              Past Medical History:  Diagnosis Date   Anxiety    Arthritis    Back problem    Chronic kidney disease    Complication of anesthesia    wakes up durning surgery   Depression    Diabetes mellitus without complication (HCC)    Dizziness    GERD (gastroesophageal reflux disease)    Hyperlipidemia    Hypertension    Pneumonia    Seasonal allergies    Shortness of breath dyspnea    Sleep apnea    Past Surgical History:  Procedure Laterality Date   BACK SURGERY     KYPHOPLASTY N/A 08/12/2012   Procedure: KYPHOPLASTY;  Surgeon: Clydene Fake, MD;  Location: [facility];  Service: Neurosurgery;  Laterality: N/A;  L1 Balloon Kyphoplasty   NASAL SINUS SURGERY     TONSILLECTOMY     TUBAL LIGATION     Patient Active Problem List   Diagnosis Date Noted   Upper airway cough syndrome 06/21/2015   Acute encephalopathy 05/10/2014   Alcohol withdrawal delirium (HCC) 05/10/2014   Difficult intubation    Acute respiratory failure with hypoxia (HCC)    Angioedema 05/06/2014   Goiter 05/06/2014   Hypertension 05/06/2014   Acute sinusitis 02/10/2014   Nausea with vomiting 02/10/2014   DM (diabetes mellitus), type 2 with renal complications (HCC)  02/08/2014   Renal insufficiency 02/07/2014   Acute renal failure (HCC) 02/07/2014   Enteritis due to Clostridium difficile 02/07/2014   Hypotension 02/07/2014   Benign hypertension 02/07/2014   Obesity, morbid (HCC) 02/07/2014   GERD (gastroesophageal reflux disease) 02/07/2014   Hyperlipidemia 02/07/2014   Sciatica 06/10/2012   Lumbar compression fracture (HCC) 06/10/2012    REFERRING DIAG: M25.552 (ICD-10-CM) - Pain in left hip   THERAPY DIAG:  Pain in right hip  Pain in left hip  Difficulty in walking, not elsewhere classified  Muscle weakness (generalized)  Rationale for Evaluation and Treatment Rehabilitation  PERTINENT HISTORY: Anxiety, depression, hx of L1 balloon kyphoplasty in 2014, HTN, DMII, kidney disease  PRECAUTIONS: None  SUBJECTIVE: Patient presents to PT with continued hip pain, R>L.  PAIN:  Are you having pain? Yes: NPRS scale: 4/10 Pain location: Rt lateral hip Pain description: achy, sharp Aggravating factors: walking/ standing, laying on either side, getting into/ out of her car, getting out of bed in the morning Relieving factors: rest, heat   OBJECTIVE: (objective measures completed at initial evaluation unless otherwise dated)   DIAGNOSTIC FINDINGS: 12/20/2020: DG Hips Bilat with or without Pelvis 3-4 Views: IMPRESSION: Negative.   PATIENT SURVEYS:  FOTO 36%,  predicted 51% in 13 visits   COGNITION:           Overall cognitive status: Within functional limits for tasks assessed                          SENSATION: Not tested     MUSCLE LENGTH: Ober's test: (+) BIL Thomas test: (+)   POSTURE: weight shift left   PALPATION: TTP to Lt/ Rt greater trochanter, Rt glute med   LOWER EXTREMITY ROM:   A/PROM Right eval Left eval  Hip flexion 95/110p! 90/110p!  Hip abduction 22/30p! 28/32p!  Hip adduction 17p! 15p!  Hip internal rotation 20/32p! 32/38p!  Hip external rotation 30/42 28/36   (Blank rows = not tested)   LOWER  EXTREMITY MMT:   MMT Right eval Left eval  Hip flexion 4/5 4/5  Hip extension 3/5 3/5  Hip abduction 3/5p! 3/5p!  Hip internal rotation 5/5 4+/5p!  Hip external rotation 5/5 5/5  Knee flexion 5/5 5/5  Knee extension 5/5 5/5   (Blank rows = not tested)   LOWER EXTREMITY SPECIAL TESTS:  FABER: (+) on Rt FADDIR: (-) Trendelenburg sign: (+) BIL, Lt>Rt   FUNCTIONAL TESTS:  5xSTS: 38 seconds with UE support and increased BIL knee and Lt hip pain Squat: 50%, BIL knee crepitus throughout, pain in Lt hip SLS: Unable to perform without UE support   GAIT: Distance walked: 50ft Assistive device utilized: None Level of assistance: Complete Independence Comments: Rt antalgic gait with off-loading to Lt and decreased stance on Rt       TODAY'S TREATMENT: [facility] Adult PT Treatment:                                                DATE: 10/28/2021 Therapeutic Exercise: Nustep level 5 x 6 mins Standing marching 2x10 BIL Standing hip abduction 2x10 BIL Standing hip extension 2x10 BIL Mini squats 2x10 Bridges 2x10 Supine hip adduction ball squeeze 5" hold 2x10 Supine clamshell GTB 2x10 Modified thomas stretch EOM x1' BIL   [facility] Adult PT Treatment:                                                DATE: 10/25/2021 Therapeutic Exercise: Nustep level 5 x 5 mins Standing marching 2x10 BIL Standing hip abduction 2x10 BIL Standing hip extension 2x10 BIL Mini squats 2x10 Bridges 2x10 Supine hip adduction ball squeeze 5" hold 2x10 Supine clamshell GTB 2x10 Supine figure 4 piriformis stretch 2x30" BIL Modified thomas stretch EOM x1' BIL   10/17/2021: demonstrated and issued HEP     PATIENT EDUCATION:  Education details: Pt educated on probable underlying pathophysiology behind her pain presentation, POC, prognosis, FOTO, and HEP Person educated: Patient Education method: Explanation, Demonstration, and Handouts Education comprehension: verbalized understanding and returned demonstration      HOME EXERCISE PROGRAM: Access Code: [email or web address] URL: [email or web address] Date: 10/17/2021 Prepared by: Vanessa Swifton   Exercises - Standing ITB Stretch  - 1 x daily - 7 x weekly - 2-min hold - Seated Piriformis Stretch  - 1 x daily - 7 x weekly - 2-min hold - Hip Flexor Stretch at Edge of Bed  - 1 x daily - 7  x weekly - 2-min hold - Supine Bridge  - 1 x daily - 7 x weekly - 3 sets - 10 reps - 3-sec hold - Sidelying Hip Abduction  - 1 x daily - 7 x weekly - 3 sets - 10 reps - 3-sec hold   ASSESSMENT:   CLINICAL IMPRESSION: Patient presents to PT with lessened pain in her R hip this session and reports HEP compliance. Session today focused on proximal hip strengthening and stretching and improving standing activity tolerance. She needed occasional rest breaks during standing exercises due to fatigue. Patient continues to benefit from skilled PT services and should be progressed as able to improve functional independence.   OBJECTIVE IMPAIRMENTS Abnormal gait, decreased activity tolerance, decreased balance, decreased endurance, decreased mobility, difficulty walking, decreased ROM, decreased strength, hypomobility, increased edema, impaired flexibility, improper body mechanics, postural dysfunction, and pain.    ACTIVITY LIMITATIONS carrying, lifting, bending, sitting, standing, squatting, sleeping, stairs, transfers, bed mobility, dressing, and locomotion level   PARTICIPATION LIMITATIONS: meal prep, cleaning, laundry, interpersonal relationship, driving, shopping, community activity, and yard work   PERSONAL FACTORS Past/current experiences, Time since onset of injury/illness/exacerbation, and 3+ comorbidities: See medical hx  are also affecting patient's functional outcome.    REHAB POTENTIAL: Fair Due to time since onset of sxs, hx of failed PT, multiple comorbidities   CLINICAL DECISION MAKING: Evolving/moderate complexity   EVALUATION COMPLEXITY: Moderate      GOALS: Goals reviewed with patient? Yes   SHORT TERM GOALS: Target date: 11/14/2021    Pt will report understanding and adherence to initial HEP in order to promote independence in the management of primary impairments. Baseline: HEP provided at eval Goal status: INITIAL     LONG TERM GOALS: Target date: 12/13/2021    Pt will achieve a FOTO score of 51% in order to demonstrate improved functional ability as it relates to her primary impairments. Baseline: 36% Goal status: INITIAL   2.  Pt will achieve a 5xSTS without use of UE in 20 seconds or less in order to promote safe transfers. Baseline: 38 seconds with UE support Goal status: INITIAL   3.  Pt will achieve 4+/5 global BIL hip strength in order to progress her exercise regimen with less limitation. Baseline: See MMT chart Goal status: INITIAL   4.  Pt will report ability to walk/ stand >10 minutes with 0-3/10 pain in order to grocery shop with less limitation. Baseline: Unable to walk/ stand any amount of time (>6/10 pain) Goal status: INITIAL       PLAN: PT FREQUENCY: 2x/week   PT DURATION: 8 weeks   PLANNED INTERVENTIONS: Therapeutic exercises, Therapeutic activity, Neuromuscular re-education, Balance training, Gait training, Patient/Family education, Joint manipulation, Joint mobilization, Stair training, DME instructions, Aquatic Therapy, Dry Needling, Electrical stimulation, Spinal mobilization, Cryotherapy, Moist heat, Taping, Vasopneumatic device, Biofeedback, Ionotophoresis 4mg /ml Dexamethasone, Manual therapy, and Re-evaluation   PLAN FOR NEXT SESSION: Progress hip stretching/ strengthening, perform brief lumbar assessment as time allows    , PTA 10/28/2021, 11:17 AM

## 2021-10-30 ENCOUNTER — Ambulatory Visit: Payer: Medicare PPO | Admitting: Physical Therapy

## 2021-11-01 ENCOUNTER — Ambulatory Visit: Payer: Medicare PPO | Admitting: Physical Therapy

## 2021-11-07 ENCOUNTER — Ambulatory Visit: Payer: Medicare PPO

## 2021-11-09 NOTE — Therapy (Signed)
OUTPATIENT PHYSICAL THERAPY TREATMENT NOTE   Patient Name: Ariel Meyer MRN: [id number] DOB:10-May-1951, 71 y.o., female Today's Date: 11/10/2021  PCP: Leeroy Cha, MD REFERRING PROVIDER: Leeroy Cha, MD  END OF SESSION:   PT End of Session - 11/10/21 1139     Visit Number 4    Number of Visits 17    Date for PT Re-Evaluation 12/19/21    Authorization Type [employer] MCR    Authorization Time Period FOTO v6, v10, kx mod v15    Progress Note Due on Visit 10    PT Start Time 1145    PT Stop Time 1230    PT Time Calculation (min) 45 min    Activity Tolerance Patient tolerated treatment well    Behavior During Therapy WFL for tasks assessed/performed               Past Medical History:  Diagnosis Date   Anxiety    Arthritis    Back problem    Chronic kidney disease    Complication of anesthesia    wakes up durning surgery   Depression    Diabetes mellitus without complication (HCC)    Dizziness    GERD (gastroesophageal reflux disease)    Hyperlipidemia    Hypertension    Pneumonia    Seasonal allergies    Shortness of breath dyspnea    Sleep apnea    Past Surgical History:  Procedure Laterality Date   BACK SURGERY     KYPHOPLASTY N/A 08/12/2012   Procedure: KYPHOPLASTY;  Surgeon: Otilio Connors, MD;  Location: [facility];  Service: Neurosurgery;  Laterality: N/A;  L1 Balloon Kyphoplasty   NASAL SINUS SURGERY     TONSILLECTOMY     TUBAL LIGATION     Patient Active Problem List   Diagnosis Date Noted   Upper airway cough syndrome 06/21/2015   Acute encephalopathy 05/10/2014   Alcohol withdrawal delirium ([place]) 05/10/2014   Difficult intubation    Acute respiratory failure with hypoxia ([place])    Angioedema 05/06/2014   Goiter 05/06/2014   Hypertension 05/06/2014   Acute sinusitis 02/10/2014   Nausea with vomiting 02/10/2014   DM (diabetes mellitus), type 2 with renal complications ([place]) 0000000   Renal insufficiency  02/07/2014   Acute renal failure ([place]) 02/07/2014   Enteritis due to Clostridium difficile 02/07/2014   Hypotension 02/07/2014   Benign hypertension 02/07/2014   Obesity, morbid ([place]) 02/07/2014   GERD (gastroesophageal reflux disease) 02/07/2014   Hyperlipidemia 02/07/2014   Sciatica 06/10/2012   Lumbar compression fracture ([place]) 06/10/2012    REFERRING DIAG: M25.552 (ICD-10-CM) - Pain in left hip   THERAPY DIAG:  Pain in right hip  Pain in left hip  Difficulty in walking, not elsewhere classified  Muscle weakness (generalized)  Rationale for Evaluation and Treatment Rehabilitation  PERTINENT HISTORY: Anxiety, depression, hx of L1 balloon kyphoplasty in 2014, HTN, DMII, kidney disease  PRECAUTIONS: None  SUBJECTIVE: Patient presents to PT with continued hip pain, R>L.  PAIN:  Are you having pain? Yes: NPRS scale: 4/10 Pain location: Rt lateral hip Pain description: achy, sharp Aggravating factors: walking/ standing, laying on either side, getting into/ out of her car, getting out of bed in the morning Relieving factors: rest, heat   OBJECTIVE: (objective measures completed at initial evaluation unless otherwise dated)   DIAGNOSTIC FINDINGS: 12/20/2020: DG Hips Bilat with or without Pelvis 3-4 Views: IMPRESSION: Negative.   PATIENT SURVEYS:  FOTO 36%, predicted 51% in 13 visits  COGNITION:           Overall cognitive status: Within functional limits for tasks assessed                          SENSATION: Not tested     MUSCLE LENGTH: Ober's test: (+) BIL Thomas test: (+)   POSTURE: weight shift left   PALPATION: TTP to Lt/ Rt greater trochanter, Rt glute med   LOWER EXTREMITY ROM:   A/PROM Right eval Left eval  Hip flexion 95/110p! 90/110p!  Hip abduction 22/30p! 28/32p!  Hip adduction 17p! 15p!  Hip internal rotation 20/32p! 32/38p!  Hip external rotation 30/42 28/36   (Blank rows = not tested)   LOWER EXTREMITY MMT:   MMT Right eval  Left eval  Hip flexion 4/5 4/5  Hip extension 3/5 3/5  Hip abduction 3/5p! 3/5p!  Hip internal rotation 5/5 4+/5p!  Hip external rotation 5/5 5/5  Knee flexion 5/5 5/5  Knee extension 5/5 5/5   (Blank rows = not tested)   LOWER EXTREMITY SPECIAL TESTS:  FABER: (+) on Rt FADDIR: (-) Trendelenburg sign: (+) BIL, Lt>Rt   FUNCTIONAL TESTS:  5xSTS: 38 seconds with UE support and increased BIL knee and Lt hip pain Squat: 50%, BIL knee crepitus throughout, pain in Lt hip SLS: Unable to perform without UE support   GAIT: Distance walked: 29ft Assistive device utilized: None Level of assistance: Complete Independence Comments: Rt antalgic gait with off-loading to Lt and decreased stance on Rt       TODAY'S TREATMENT: [facility] Adult PT Treatment:                                                DATE: 11/10/2021 Aquatic therapy at [facility]- [address] - therapeutic pool temp 91 degrees Pt enters building ambulating independently.  Treatment took place in water 3.8 to  4 ft 8 in.feet deep depending upon activity.  Pt entered and exited the pool via stair and handrails independently.  Pt pain level 4/10 at initiation of water walking.  Therapeutic Exercise: Walking forward/backwards/side stepping Runners stretch on bottom step x30" BIL Hamstring stretch on bottom step x30" BIL Figure 4 squat stretch, BIL UE support 2x30" BIL At edge of pool, pt performed LE exercise: Hip abd/add x20 BIL Hip ext/flex with knee straight x 20 BIL Hip Circles CC/CCW x10 each BIL Marching hip flexion to knee extension 2x10 BIL Squats 2x20 Step ups on submerged step 2x10 Rt Step up and overs on submerged step 2x10 Rt Hip flexion to 90 with ER/IR x20 each  Pt requires the buoyancy of water for active assisted exercises with buoyancy supported for strengthening and AROM exercises. Hydrostatic pressure also supports joints by unweighting joint load by at least 50 % in 3-4 feet depth water. 80% in  chest to neck deep water. Water will provide assistance with movement using the current and laminar flow while the buoyancy reduces weight bearing. Pt requires the viscosity of the water for resistance with strengthening exercises.   [facility] Adult PT Treatment:  DATE: 10/28/2021 Therapeutic Exercise: Nustep level 5 x 6 mins Standing marching 2x10 BIL Standing hip abduction 2x10 BIL Standing hip extension 2x10 BIL Mini squats 2x10 Bridges 2x10 Supine hip adduction ball squeeze 5" hold 2x10 Supine clamshell GTB 2x10 Modified thomas stretch EOM x1' BIL   [facility] Adult PT Treatment:                                                DATE: 10/25/2021 Therapeutic Exercise: Nustep level 5 x 5 mins Standing marching 2x10 BIL Standing hip abduction 2x10 BIL Standing hip extension 2x10 BIL Mini squats 2x10 Bridges 2x10 Supine hip adduction ball squeeze 5" hold 2x10 Supine clamshell GTB 2x10 Supine figure 4 piriformis stretch 2x30" BIL Modified thomas stretch EOM x1' BIL       PATIENT EDUCATION:  Education details: Pt educated on probable underlying pathophysiology behind her pain presentation, POC, prognosis, FOTO, and HEP Person educated: Patient Education method: Explanation, Demonstration, and Handouts Education comprehension: verbalized understanding and returned demonstration     HOME EXERCISE PROGRAM: Access Code: [email or web address] URL: [email or web address] Date: 10/17/2021 Prepared by: Carmelina Dane   Exercises - Standing ITB Stretch  - 1 x daily - 7 x weekly - 2-min hold - Seated Piriformis Stretch  - 1 x daily - 7 x weekly - 2-min hold - Hip Flexor Stretch at Edge of Bed  - 1 x daily - 7 x weekly - 2-min hold - Supine Bridge  - 1 x daily - 7 x weekly - 3 sets - 10 reps - 3-sec hold - Sidelying Hip Abduction  - 1 x daily - 7 x weekly - 3 sets - 10 reps - 3-sec hold   ASSESSMENT:   CLINICAL IMPRESSION: Patient presents  for her first aquatic therapy session reporting 4/10 pain at the beginning of the session. She was initially apprehensive about the water, but quickly became acclimated and was able to ambulate independently in the water. Session today focused on BIL LE strengthening and general conditioning in the aquatic environment for use of buoyancy to offload joints and the viscosity of water as resistance during therapeutic exercise. Patient was able to tolerate all prescribed exercises in the aquatic environment with no adverse effects and reports 3/10 pain at the end of the session. Patient continues to benefit from skilled PT services on land and aquatic based and should be progressed as able to improve functional independence.   OBJECTIVE IMPAIRMENTS Abnormal gait, decreased activity tolerance, decreased balance, decreased endurance, decreased mobility, difficulty walking, decreased ROM, decreased strength, hypomobility, increased edema, impaired flexibility, improper body mechanics, postural dysfunction, and pain.    ACTIVITY LIMITATIONS carrying, lifting, bending, sitting, standing, squatting, sleeping, stairs, transfers, bed mobility, dressing, and locomotion level   PARTICIPATION LIMITATIONS: meal prep, cleaning, laundry, interpersonal relationship, driving, shopping, community activity, and yard work   PERSONAL FACTORS Past/current experiences, Time since onset of injury/illness/exacerbation, and 3+ comorbidities: See medical hx  are also affecting patient's functional outcome.    REHAB POTENTIAL: Fair Due to time since onset of sxs, hx of failed PT, multiple comorbidities   CLINICAL DECISION MAKING: Evolving/moderate complexity   EVALUATION COMPLEXITY: Moderate     GOALS: Goals reviewed with patient? Yes   SHORT TERM GOALS: Target date: 11/14/2021    Pt will report understanding and adherence to initial HEP in order to promote independence in the management  of primary impairments. Baseline: HEP  provided at eval Goal status: INITIAL     LONG TERM GOALS: Target date: 12/13/2021    Pt will achieve a FOTO score of 51% in order to demonstrate improved functional ability as it relates to her primary impairments. Baseline: 36% Goal status: INITIAL   2.  Pt will achieve a 5xSTS without use of UE in 20 seconds or less in order to promote safe transfers. Baseline: 38 seconds with UE support Goal status: INITIAL   3.  Pt will achieve 4+/5 global BIL hip strength in order to progress her exercise regimen with less limitation. Baseline: See MMT chart Goal status: INITIAL   4.  Pt will report ability to walk/ stand >10 minutes with 0-3/10 pain in order to grocery shop with less limitation. Baseline: Unable to walk/ stand any amount of time (>6/10 pain) Goal status: INITIAL       PLAN: PT FREQUENCY: 2x/week   PT DURATION: 8 weeks   PLANNED INTERVENTIONS: Therapeutic exercises, Therapeutic activity, Neuromuscular re-education, Balance training, Gait training, Patient/Family education, Joint manipulation, Joint mobilization, Stair training, DME instructions, Aquatic Therapy, Dry Needling, Electrical stimulation, Spinal mobilization, Cryotherapy, Moist heat, Taping, Vasopneumatic device, Biofeedback, Ionotophoresis 4mg /ml Dexamethasone, Manual therapy, and Re-evaluation   PLAN FOR NEXT SESSION: Progress hip stretching/ strengthening, perform brief lumbar assessment as time allows    Evelene Croon, PTA 11/10/2021, 11:39 AM

## 2021-11-10 ENCOUNTER — Ambulatory Visit: Payer: Medicare PPO

## 2021-11-10 DIAGNOSIS — M6281 Muscle weakness (generalized): Secondary | ICD-10-CM | POA: Diagnosis not present

## 2021-11-10 DIAGNOSIS — R262 Difficulty in walking, not elsewhere classified: Secondary | ICD-10-CM

## 2021-11-10 DIAGNOSIS — M25551 Pain in right hip: Secondary | ICD-10-CM

## 2021-11-10 DIAGNOSIS — M25552 Pain in left hip: Secondary | ICD-10-CM | POA: Diagnosis not present

## 2021-11-15 ENCOUNTER — Ambulatory Visit: Payer: Medicare PPO | Attending: Internal Medicine

## 2021-11-15 DIAGNOSIS — M25552 Pain in left hip: Secondary | ICD-10-CM | POA: Diagnosis not present

## 2021-11-15 DIAGNOSIS — M25551 Pain in right hip: Secondary | ICD-10-CM | POA: Insufficient documentation

## 2021-11-15 DIAGNOSIS — M6281 Muscle weakness (generalized): Secondary | ICD-10-CM | POA: Insufficient documentation

## 2021-11-15 DIAGNOSIS — R262 Difficulty in walking, not elsewhere classified: Secondary | ICD-10-CM | POA: Diagnosis not present

## 2021-11-15 NOTE — Therapy (Signed)
OUTPATIENT PHYSICAL THERAPY TREATMENT NOTE   Patient Name: Ariel Meyer MRN: [id number] DOB:09/08/1950, 71 y.o., female Today's Date: 11/15/2021  PCP: Varadarajan, Rupashree, MD REFERRING PROVIDER: Varadarajan, Rupashree, MD  END OF SESSION:   PT End of Session - 11/15/21 1446     Visit Number 5    Number of Visits 17    Date for PT Re-Evaluation 12/19/21    Authorization Type [employer] MCR    Authorization Time Period FOTO v6, v10, kx mod v15    Progress Note Due on Visit 10    PT Start Time 1445    PT Stop Time 1530    PT Time Calculation (min) 45 min                Past Medical History:  Diagnosis Date   Anxiety    Arthritis    Back problem    Chronic kidney disease    Complication of anesthesia    wakes up durning surgery   Depression    Diabetes mellitus without complication (HCC)    Dizziness    GERD (gastroesophageal reflux disease)    Hyperlipidemia    Hypertension    Pneumonia    Seasonal allergies    Shortness of breath dyspnea    Sleep apnea    Past Surgical History:  Procedure Laterality Date   BACK SURGERY     KYPHOPLASTY N/A 08/12/2012   Procedure: KYPHOPLASTY;  Surgeon: James R Hirsch, MD;  Location: [facility];  Service: Neurosurgery;  Laterality: N/A;  L1 Balloon Kyphoplasty   NASAL SINUS SURGERY     TONSILLECTOMY     TUBAL LIGATION     Patient Active Problem List   Diagnosis Date Noted   Upper airway cough syndrome 06/21/2015   Acute encephalopathy 05/10/2014   Alcohol withdrawal delirium (HCC) 05/10/2014   Difficult intubation    Acute respiratory failure with hypoxia (HCC)    Angioedema 05/06/2014   Goiter 05/06/2014   Hypertension 05/06/2014   Acute sinusitis 02/10/2014   Nausea with vomiting 02/10/2014   DM (diabetes mellitus), type 2 with renal complications (HCC) 02/08/2014   Renal insufficiency 02/07/2014   Acute renal failure (HCC) 02/07/2014   Enteritis due to Clostridium difficile 02/07/2014   Hypotension  02/07/2014   Benign hypertension 02/07/2014   Obesity, morbid (HCC) 02/07/2014   GERD (gastroesophageal reflux disease) 02/07/2014   Hyperlipidemia 02/07/2014   Sciatica 06/10/2012   Lumbar compression fracture (HCC) 06/10/2012    REFERRING DIAG: M25.552 (ICD-10-CM) - Pain in left hip   THERAPY DIAG:  Pain in right hip  Pain in left hip  Difficulty in walking, not elsewhere classified  Muscle weakness (generalized)  Rationale for Evaluation and Treatment Rehabilitation  PERTINENT HISTORY: Anxiety, depression, hx of L1 balloon kyphoplasty in 2014, HTN, DMII, kidney disease  PRECAUTIONS: None  SUBJECTIVE: Patient presents to PT with continued hip pain, R>L. She reports it feels better with rest.   PAIN:  Are you having pain? Yes: NPRS scale: 8/10 Pain location: Rt lateral hip Pain description: achy, sharp Aggravating factors: walking/ standing, laying on either side, getting into/ out of her car, getting out of bed in the morning Relieving factors: rest, heat   OBJECTIVE: (objective measures completed at initial evaluation unless otherwise dated)   DIAGNOSTIC FINDINGS: 12/20/2020: DG Hips Bilat with or without Pelvis 3-4 Views: IMPRESSION: Negative.   PATIENT SURVEYS:  FOTO 36%, predicted 51% in 13 visits   COGNITION:             Overall cognitive status: Within functional limits for tasks assessed                          SENSATION: Not tested     MUSCLE LENGTH: Ober's test: (+) BIL Thomas test: (+)   POSTURE: weight shift left   PALPATION: TTP to Lt/ Rt greater trochanter, Rt glute med   LOWER EXTREMITY ROM:   A/PROM Right eval Left eval  Hip flexion 95/110p! 90/110p!  Hip abduction 22/30p! 28/32p!  Hip adduction 17p! 15p!  Hip internal rotation 20/32p! 32/38p!  Hip external rotation 30/42 28/36   (Blank rows = not tested)   LOWER EXTREMITY MMT:   MMT Right eval Left eval  Hip flexion 4/5 4/5  Hip extension 3/5 3/5  Hip abduction 3/5p! 3/5p!   Hip internal rotation 5/5 4+/5p!  Hip external rotation 5/5 5/5  Knee flexion 5/5 5/5  Knee extension 5/5 5/5   (Blank rows = not tested)   LOWER EXTREMITY SPECIAL TESTS:  FABER: (+) on Rt FADDIR: (-) Trendelenburg sign: (+) BIL, Lt>Rt   FUNCTIONAL TESTS:  5xSTS: 38 seconds with UE support and increased BIL knee and Lt hip pain Squat: 50%, BIL knee crepitus throughout, pain in Lt hip SLS: Unable to perform without UE support   GAIT: Distance walked: 20ft Assistive device utilized: None Level of assistance: Complete Independence Comments: Rt antalgic gait with off-loading to Lt and decreased stance on Rt       TODAY'S TREATMENT: [facility] Adult PT Treatment:                                                DATE: 11/15/2021 Therapeutic Exercise: Nustep level 4 x 6 mins Standing marching x10 BIL Standing hip abduction x10 BIL Standing hip extension x10 BIL  Standing hamstring curl x10 BIL Mini squats x10 Bridges 2x10 Supine hip adduction ball squeeze 5" hold 2x10 Supine clamshell GTB 2x10 Supine figure 4 piriformis stretch 2x30" BIL   [facility] Adult PT Treatment:                                                DATE: 11/10/2021 Aquatic therapy at [facility]- [address] - therapeutic pool temp 91 degrees Pt enters building ambulating independently.  Treatment took place in water 3.8 to  4 ft 8 in.feet deep depending upon activity.  Pt entered and exited the pool via stair and handrails independently.  Pt pain level 4/10 at initiation of water walking.  Therapeutic Exercise: Walking forward/backwards/side stepping Runners stretch on bottom step x30" BIL Hamstring stretch on bottom step x30" BIL Figure 4 squat stretch, BIL UE support 2x30" BIL At edge of pool, pt performed LE exercise: Hip abd/add x20 BIL Hip ext/flex with knee straight x 20 BIL Hip Circles CC/CCW x10 each BIL Marching hip flexion to knee extension 2x10 BIL Squats 2x20 Step ups on submerged step 2x10  Rt Step up and overs on submerged step 2x10 Rt Hip flexion to 90 with ER/IR x20 each  Pt requires the buoyancy of water for active assisted exercises with buoyancy supported for strengthening and AROM exercises. Hydrostatic pressure also supports joints by unweighting joint load by at least 50 %   in 3-4 feet depth water. 80% in chest to neck deep water. Water will provide assistance with movement using the current and laminar flow while the buoyancy reduces weight bearing. Pt requires the viscosity of the water for resistance with strengthening exercises.   [facility] Adult PT Treatment:                                                DATE: 10/28/2021 Therapeutic Exercise: Nustep level 5 x 6 mins Standing marching 2x10 BIL Standing hip abduction 2x10 BIL Standing hip extension 2x10 BIL Mini squats 2x10 Bridges 2x10 Supine hip adduction ball squeeze 5" hold 2x10 Supine clamshell GTB 2x10 Modified thomas stretch EOM x1' BIL   PATIENT EDUCATION:  Education details: Pt educated on probable underlying pathophysiology behind her pain presentation, POC, prognosis, FOTO, and HEP Person educated: Patient Education method: Explanation, Demonstration, and Handouts Education comprehension: verbalized understanding and returned demonstration     HOME EXERCISE PROGRAM: Access Code: [email or web address] URL: [email or web address] Date: 10/17/2021 Prepared by: Tucker Yarborough   Exercises - Standing ITB Stretch  - 1 x daily - 7 x weekly - 2-min hold - Seated Piriformis Stretch  - 1 x daily - 7 x weekly - 2-min hold - Hip Flexor Stretch at Edge of Bed  - 1 x daily - 7 x weekly - 2-min hold - Supine Bridge  - 1 x daily - 7 x weekly - 3 sets - 10 reps - 3-sec hold - Sidelying Hip Abduction  - 1 x daily - 7 x weekly - 3 sets - 10 reps - 3-sec hold   ASSESSMENT:   CLINICAL IMPRESSION: Patient presents to PT with continued pain in bilateral hips, R>L and reports increased pain this session due to  increased activity yesterday. She reports that she felt good after the last aquatic session. Session today continued to focus on proximal hip strengthening with slightly regressed exercises due to high levels of pain this session. Patient continues to benefit from skilled PT services and should be progressed as able to improve functional independence.  OBJECTIVE IMPAIRMENTS Abnormal gait, decreased activity tolerance, decreased balance, decreased endurance, decreased mobility, difficulty walking, decreased ROM, decreased strength, hypomobility, increased edema, impaired flexibility, improper body mechanics, postural dysfunction, and pain.    ACTIVITY LIMITATIONS carrying, lifting, bending, sitting, standing, squatting, sleeping, stairs, transfers, bed mobility, dressing, and locomotion level   PARTICIPATION LIMITATIONS: meal prep, cleaning, laundry, interpersonal relationship, driving, shopping, community activity, and yard work   PERSONAL FACTORS Past/current experiences, Time since onset of injury/illness/exacerbation, and 3+ comorbidities: See medical hx  are also affecting patient's functional outcome.    REHAB POTENTIAL: Fair Due to time since onset of sxs, hx of failed PT, multiple comorbidities   CLINICAL DECISION MAKING: Evolving/moderate complexity   EVALUATION COMPLEXITY: Moderate     GOALS: Goals reviewed with patient? Yes   SHORT TERM GOALS: Target date: 11/14/2021    Pt will report understanding and adherence to initial HEP in order to promote independence in the management of primary impairments. Baseline: HEP provided at eval Patient reports adherence 11/14/21 Goal status: MET     LONG TERM GOALS: Target date: 12/13/2021    Pt will achieve a FOTO score of 51% in order to demonstrate improved functional ability as it relates to her primary impairments. Baseline: 36% Goal status: INITIAL   2.  Pt will achieve   a 5xSTS without use of UE in 20 seconds or less in order to promote  safe transfers. Baseline: 38 seconds with UE support Goal status: INITIAL   3.  Pt will achieve 4+/5 global BIL hip strength in order to progress her exercise regimen with less limitation. Baseline: See MMT chart Goal status: INITIAL   4.  Pt will report ability to walk/ stand >10 minutes with 0-3/10 pain in order to grocery shop with less limitation. Baseline: Unable to walk/ stand any amount of time (>6/10 pain) Goal status: INITIAL       PLAN: PT FREQUENCY: 2x/week   PT DURATION: 8 weeks   PLANNED INTERVENTIONS: Therapeutic exercises, Therapeutic activity, Neuromuscular re-education, Balance training, Gait training, Patient/Family education, Joint manipulation, Joint mobilization, Stair training, DME instructions, Aquatic Therapy, Dry Needling, Electrical stimulation, Spinal mobilization, Cryotherapy, Moist heat, Taping, Vasopneumatic device, Biofeedback, Ionotophoresis 75m/ml Dexamethasone, Manual therapy, and Re-evaluation   PLAN FOR NEXT SESSION: Progress hip stretching/ strengthening, perform brief lumbar assessment as time allows    SEvelene Croon PTA 11/15/2021, 2:46 PM

## 2021-11-16 NOTE — Therapy (Signed)
OUTPATIENT PHYSICAL THERAPY TREATMENT NOTE   Patient Name: Ariel Meyer MRN: [id number] DOB:23-Apr-1951, 71 y.o., female Today's Date: 11/17/2021  PCP: Leeroy Cha, MD REFERRING PROVIDER: Leeroy Cha, MD  END OF SESSION:   PT End of Session - 11/17/21 1219     Visit Number 6    Number of Visits 17    Date for PT Re-Evaluation 12/19/21    Authorization Type [employer] MCR    Authorization Time Period FOTO v6, v10, kx mod v15    Progress Note Due on Visit 10    PT Start Time 1230    PT Stop Time 1315    PT Time Calculation (min) 45 min    Activity Tolerance Patient tolerated treatment well;Patient limited by pain    Behavior During Therapy WFL for tasks assessed/performed                 Past Medical History:  Diagnosis Date   Anxiety    Arthritis    Back problem    Chronic kidney disease    Complication of anesthesia    wakes up durning surgery   Depression    Diabetes mellitus without complication (HCC)    Dizziness    GERD (gastroesophageal reflux disease)    Hyperlipidemia    Hypertension    Pneumonia    Seasonal allergies    Shortness of breath dyspnea    Sleep apnea    Past Surgical History:  Procedure Laterality Date   BACK SURGERY     KYPHOPLASTY N/A 08/12/2012   Procedure: KYPHOPLASTY;  Surgeon: Otilio Connors, MD;  Location: [facility];  Service: Neurosurgery;  Laterality: N/A;  L1 Balloon Kyphoplasty   NASAL SINUS SURGERY     TONSILLECTOMY     TUBAL LIGATION     Patient Active Problem List   Diagnosis Date Noted   Upper airway cough syndrome 06/21/2015   Acute encephalopathy 05/10/2014   Alcohol withdrawal delirium ([place]) 05/10/2014   Difficult intubation    Acute respiratory failure with hypoxia ([place])    Angioedema 05/06/2014   Goiter 05/06/2014   Hypertension 05/06/2014   Acute sinusitis 02/10/2014   Nausea with vomiting 02/10/2014   DM (diabetes mellitus), type 2 with renal complications ([place]) 17/40/8144    Renal insufficiency 02/07/2014   Acute renal failure ([place]) 02/07/2014   Enteritis due to Clostridium difficile 02/07/2014   Hypotension 02/07/2014   Benign hypertension 02/07/2014   Obesity, morbid ([place]) 02/07/2014   GERD (gastroesophageal reflux disease) 02/07/2014   Hyperlipidemia 02/07/2014   Sciatica 06/10/2012   Lumbar compression fracture ([place]) 06/10/2012    REFERRING DIAG: M25.552 (ICD-10-CM) - Pain in left hip   THERAPY DIAG:  Pain in right hip  Pain in left hip  Difficulty in walking, not elsewhere classified  Muscle weakness (generalized)  Rationale for Evaluation and Treatment Rehabilitation  PERTINENT HISTORY: Anxiety, depression, hx of L1 balloon kyphoplasty in 2014, HTN, DMII, kidney disease  PRECAUTIONS: None  SUBJECTIVE: Patient reports continued hip pain that is better today than it was earlier this week.  PAIN:  Are you having pain? Yes: NPRS scale: 6/10 Pain location: Rt lateral hip Pain description: achy, sharp Aggravating factors: walking/ standing, laying on either side, getting into/ out of her car, getting out of bed in the morning Relieving factors: rest, heat   OBJECTIVE: (objective measures completed at initial evaluation unless otherwise dated)   DIAGNOSTIC FINDINGS: 12/20/2020: DG Hips Bilat with or without Pelvis 3-4 Views: IMPRESSION: Negative.   PATIENT  SURVEYS:  FOTO 36%, predicted 51% in 13 visits   COGNITION:           Overall cognitive status: Within functional limits for tasks assessed                          SENSATION: Not tested     MUSCLE LENGTH: Ober's test: (+) BIL Thomas test: (+)   POSTURE: weight shift left   PALPATION: TTP to Lt/ Rt greater trochanter, Rt glute med   LOWER EXTREMITY ROM:   A/PROM Right eval Left eval  Hip flexion 95/110p! 90/110p!  Hip abduction 22/30p! 28/32p!  Hip adduction 17p! 15p!  Hip internal rotation 20/32p! 32/38p!  Hip external rotation 30/42 28/36   (Blank rows = not  tested)   LOWER EXTREMITY MMT:   MMT Right eval Left eval  Hip flexion 4/5 4/5  Hip extension 3/5 3/5  Hip abduction 3/5p! 3/5p!  Hip internal rotation 5/5 4+/5p!  Hip external rotation 5/5 5/5  Knee flexion 5/5 5/5  Knee extension 5/5 5/5   (Blank rows = not tested)   LOWER EXTREMITY SPECIAL TESTS:  FABER: (+) on Rt FADDIR: (-) Trendelenburg sign: (+) BIL, Lt>Rt   FUNCTIONAL TESTS:  5xSTS: 38 seconds with UE support and increased BIL knee and Lt hip pain Squat: 50%, BIL knee crepitus throughout, pain in Lt hip SLS: Unable to perform without UE support   GAIT: Distance walked: 97f Assistive device utilized: None Level of assistance: Complete Independence Comments: Rt antalgic gait with off-loading to Lt and decreased stance on Rt       [facility]                                                DATE: 11/10/2021 [address] therapeutic pool temp 91 degrees Pt enters building ambulating independently.  Treatment took place in water 3.8 to  4 ft 8 in.feet deep depending upon activity.  Pt entered and exited the pool via stair and handrails independently.  Pt pain level 6/10 at initiation of water walking.  Therapeutic Exercise: Walking forward/backwards/side stepping Runners stretch on bottom step x30" BIL Hamstring stretch on bottom step x30" BIL Figure 4 squat stretch, BIL UE support 2x30" BIL STS from 3rd step from bottom, BIL UE support x10 Forward marching walk x1 lap holding yellow noodle At edge of pool, pt performed LE exercise: Hip abd/add x20 BIL Hip ext/flex with knee straight x 20 BIL Marching hip flexion to knee extension 2x10 BIL Squats x20 (small range) Heel/toe raises x20 Hip flexion to 90 with ER/IR x10 each  Pt requires the buoyancy of water for active assisted exercises with buoyancy supported for strengthening and AROM exercises. Hydrostatic pressure also supports joints by  unweighting joint load by at least 50 % in 3-4 feet depth water. 80% in chest to neck deep water. Water will provide assistance with movement using the current and laminar flow while the buoyancy reduces weight bearing. Pt requires the viscosity of the water for resistance with strengthening exercises.  [facility]  [facility]: 11/15/2021 Therapeutic Exercise: Nustep level 4 x 6 mins Standing marching x10 BIL Standing hip abduction x10 BIL Standing hip extension x10 BIL  Standing hamstring curl x10 BIL Mini squats x10 Bridges 2x10 Supine hip adduction ball squeeze 5" hold 2x10 Supine clamshell GTB 2x10 Supine figure 4 piriformis stretch 2x30" BIL   [facility] Adult PT Treatment:                                                DATE: 11/10/2021 Aquatic therapy at [address] - therapeutic pool temp 91 degrees Pt enters building ambulating independently.  Treatment took place in water 3.8 to  4 ft 8 in.feet deep depending upon activity.  Pt entered and exited the pool via stair and handrails independently.  Pt pain level 4/10 at initiation of water walking.  Therapeutic Exercise: Walking forward/backwards/side stepping Runners stretch on bottom step x30" BIL Hamstring stretch on bottom step x30" BIL Figure 4 squat stretch, BIL UE support 2x30" BIL At edge of pool, pt performed LE exercise: Hip abd/add x20 BIL Hip ext/flex with knee straight x 20 BIL Hip Circles CC/CCW x10 each BIL Marching hip flexion to knee extension 2x10 BIL Squats 2x20 Step ups on submerged step 2x10 Rt Step up and overs on submerged step 2x10 Rt Hip flexion to 90 with ER/IR x20 each  Pt requires the buoyancy of water for active assisted exercises with buoyancy supported for strengthening and AROM exercises. Hydrostatic pressure also supports joints by unweighting joint load by at least 50 % in 3-4 feet depth water. 80% in chest to neck deep water.  Water will provide assistance with movement using the current and laminar flow while the buoyancy reduces weight bearing. Pt requires the viscosity of the water for resistance with strengthening exercises.   PATIENT EDUCATION:  Education details: Pt educated on probable underlying pathophysiology behind her pain presentation, POC, prognosis, FOTO, and HEP Person educated: Patient Education method: Explanation, Demonstration, and Handouts Education comprehension: verbalized understanding and returned demonstration     HOME EXERCISE PROGRAM: Access Code: [email or web address] URL: [email or web address] Date: 10/17/2021 Prepared by: Vanessa Williamson   Exercises - Standing ITB Stretch  - 1 x daily - 7 x weekly - 2-min hold - Seated Piriformis Stretch  - 1 x daily - 7 x weekly - 2-min hold - Hip Flexor Stretch at Edge of Bed  - 1 x daily - 7 x weekly - 2-min hold - Supine Bridge  - 1 x daily - 7 x weekly - 3 sets - 10 reps - 3-sec hold - Sidelying Hip Abduction  - 1 x daily - 7 x weekly - 3 sets - 10 reps - 3-sec hold   ASSESSMENT:   CLINICAL IMPRESSION: Patient presents to aquatic session with continued pain in bilateral hips, R>L and reports decreased pain this session from earlier this week. Session today focused on BIL proximal hip and LE strengthening in the aquatic environment for use of buoyancy to offload joints and the viscosity of water as resistance during therapeutic exercise. Patient was able to tolerate all prescribed exercises in the aquatic environment with no adverse effects and reports 5/10 pain at the end of the session. Patient continues to benefit from skilled PT services on land and aquatic based and should be progressed as able to improve functional independence.  OBJECTIVE IMPAIRMENTS Abnormal gait, decreased  activity tolerance, decreased balance, decreased endurance, decreased mobility, difficulty walking, decreased ROM, decreased strength, hypomobility, increased  edema, impaired flexibility, improper body mechanics, postural dysfunction, and pain.    ACTIVITY LIMITATIONS carrying, lifting, bending, sitting, standing, squatting, sleeping, stairs, transfers, bed mobility, dressing, and locomotion level   PARTICIPATION LIMITATIONS: meal prep, cleaning, laundry, interpersonal relationship, driving, shopping, community activity, and yard work   PERSONAL FACTORS Past/current experiences, Time since onset of injury/illness/exacerbation, and 3+ comorbidities: See medical hx  are also affecting patient's functional outcome.    REHAB POTENTIAL: Fair Due to time since onset of sxs, hx of failed PT, multiple comorbidities   CLINICAL DECISION MAKING: Evolving/moderate complexity   EVALUATION COMPLEXITY: Moderate     GOALS: Goals reviewed with patient? Yes   SHORT TERM GOALS: Target date: 11/14/2021    Pt will report understanding and adherence to initial HEP in order to promote independence in the management of primary impairments. Baseline: HEP provided at eval Patient reports adherence 11/14/21 Goal status: MET     LONG TERM GOALS: Target date: 12/13/2021    Pt will achieve a FOTO score of 51% in order to demonstrate improved functional ability as it relates to her primary impairments. Baseline: 36% Goal status: INITIAL   2.  Pt will achieve a 5xSTS without use of UE in 20 seconds or less in order to promote safe transfers. Baseline: 38 seconds with UE support Goal status: INITIAL   3.  Pt will achieve 4+/5 global BIL hip strength in order to progress her exercise regimen with less limitation. Baseline: See MMT chart Goal status: INITIAL   4.  Pt will report ability to walk/ stand >10 minutes with 0-3/10 pain in order to grocery shop with less limitation. Baseline: Unable to walk/ stand any amount of time (>6/10 pain) Goal status: INITIAL       PLAN: PT FREQUENCY: 2x/week   PT DURATION: 8 weeks   PLANNED INTERVENTIONS: Therapeutic exercises,  Therapeutic activity, Neuromuscular re-education, Balance training, Gait training, Patient/Family education, Joint manipulation, Joint mobilization, Stair training, DME instructions, Aquatic Therapy, Dry Needling, Electrical stimulation, Spinal mobilization, Cryotherapy, Moist heat, Taping, Vasopneumatic device, Biofeedback, Ionotophoresis 80m/ml Dexamethasone, Manual therapy, and Re-evaluation   PLAN FOR NEXT SESSION: Progress hip stretching/ strengthening, perform brief lumbar assessment as time allows    SEvelene Croon PTA 11/17/2021, 12:31 PM

## 2021-11-17 ENCOUNTER — Ambulatory Visit: Payer: Medicare PPO

## 2021-11-17 DIAGNOSIS — R262 Difficulty in walking, not elsewhere classified: Secondary | ICD-10-CM

## 2021-11-17 DIAGNOSIS — M25552 Pain in left hip: Secondary | ICD-10-CM

## 2021-11-17 DIAGNOSIS — M6281 Muscle weakness (generalized): Secondary | ICD-10-CM | POA: Diagnosis not present

## 2021-11-17 DIAGNOSIS — M25551 Pain in right hip: Secondary | ICD-10-CM

## 2021-11-21 ENCOUNTER — Ambulatory Visit: Payer: Medicare PPO

## 2021-11-21 DIAGNOSIS — M25552 Pain in left hip: Secondary | ICD-10-CM

## 2021-11-21 DIAGNOSIS — R262 Difficulty in walking, not elsewhere classified: Secondary | ICD-10-CM | POA: Diagnosis not present

## 2021-11-21 DIAGNOSIS — M6281 Muscle weakness (generalized): Secondary | ICD-10-CM

## 2021-11-21 DIAGNOSIS — M25551 Pain in right hip: Secondary | ICD-10-CM | POA: Diagnosis not present

## 2021-11-21 NOTE — Therapy (Signed)
OUTPATIENT PHYSICAL THERAPY TREATMENT NOTE   Patient Name: Ariel Meyer MRN: [id number] DOB:1950-10-10, 71 y.o., female Today's Date: 11/21/2021  PCP: Leeroy Cha, MD REFERRING PROVIDER: Leeroy Cha, MD  END OF SESSION:   PT End of Session - 11/21/21 1401     Visit Number 7    Number of Visits 17    Date for PT Re-Evaluation 12/19/21    Authorization Type [employer] MCR    Authorization Time Period FOTO v6, v10, kx mod v15    Progress Note Due on Visit 10    PT Start Time 1402    PT Stop Time 2035    PT Time Calculation (min) 40 min    Activity Tolerance Patient tolerated treatment well;Patient limited by pain    Behavior During Therapy WFL for tasks assessed/performed                  Past Medical History:  Diagnosis Date   Anxiety    Arthritis    Back problem    Chronic kidney disease    Complication of anesthesia    wakes up durning surgery   Depression    Diabetes mellitus without complication (HCC)    Dizziness    GERD (gastroesophageal reflux disease)    Hyperlipidemia    Hypertension    Pneumonia    Seasonal allergies    Shortness of breath dyspnea    Sleep apnea    Past Surgical History:  Procedure Laterality Date   BACK SURGERY     KYPHOPLASTY N/A 08/12/2012   Procedure: KYPHOPLASTY;  Surgeon: Otilio Connors, MD;  Location: [facility];  Service: Neurosurgery;  Laterality: N/A;  L1 Balloon Kyphoplasty   NASAL SINUS SURGERY     TONSILLECTOMY     TUBAL LIGATION     Patient Active Problem List   Diagnosis Date Noted   Upper airway cough syndrome 06/21/2015   Acute encephalopathy 05/10/2014   Alcohol withdrawal delirium ([place]) 05/10/2014   Difficult intubation    Acute respiratory failure with hypoxia ([place])    Angioedema 05/06/2014   Goiter 05/06/2014   Hypertension 05/06/2014   Acute sinusitis 02/10/2014   Nausea with vomiting 02/10/2014   DM (diabetes mellitus), type 2 with renal complications ([place])  59/74/1638   Renal insufficiency 02/07/2014   Acute renal failure ([place]) 02/07/2014   Enteritis due to Clostridium difficile 02/07/2014   Hypotension 02/07/2014   Benign hypertension 02/07/2014   Obesity, morbid ([place]) 02/07/2014   GERD (gastroesophageal reflux disease) 02/07/2014   Hyperlipidemia 02/07/2014   Sciatica 06/10/2012   Lumbar compression fracture ([place]) 06/10/2012    REFERRING DIAG: M25.552 (ICD-10-CM) - Pain in left hip   THERAPY DIAG:  Pain in right hip  Pain in left hip  Difficulty in walking, not elsewhere classified  Muscle weakness (generalized)  Rationale for Evaluation and Treatment Rehabilitation  PERTINENT HISTORY: Anxiety, depression, hx of L1 balloon kyphoplasty in 2014, HTN, DMII, kidney disease  PRECAUTIONS: None  SUBJECTIVE: Pt reports continued soreness and pain about her Rt hip, although she thinks she is making some progress with PT.  PAIN:  Are you having pain? Yes: NPRS scale: 2-3/10 Pain location: Rt lateral hip Pain description: achy, sharp Aggravating factors: walking/ standing, laying on either side, getting into/ out of her car, getting out of bed in the morning Relieving factors: rest, heat   OBJECTIVE: (objective measures completed at initial evaluation unless otherwise dated)   DIAGNOSTIC FINDINGS: 12/20/2020: DG Hips Bilat with or without Pelvis 3-4  Views: IMPRESSION: Negative.   PATIENT SURVEYS:  FOTO 36%, predicted 51% in 13 visits 11/21/2021: 43%   COGNITION:           Overall cognitive status: Within functional limits for tasks assessed                          SENSATION: Not tested     MUSCLE LENGTH: Ober's test: (+) BIL Thomas test: (+)   POSTURE: weight shift left   PALPATION: TTP to Lt/ Rt greater trochanter, Rt glute med   LOWER EXTREMITY ROM:   A/PROM Right eval Left eval  Hip flexion 95/110p! 90/110p!  Hip abduction 22/30p! 28/32p!  Hip adduction 17p! 15p!  Hip internal rotation 20/32p! 32/38p!   Hip external rotation 30/42 28/36   (Blank rows = not tested)   LOWER EXTREMITY MMT:   MMT Right eval Left eval Right 11/21/2021 Left 11/21/2021  Hip flexion 4/5 4/5 4/5 4+/5  Hip extension 3/5 3/5 4/5 4/5  Hip abduction 3/5p! 3/5p! 4+/5 3+/5p!  Hip internal rotation 5/5 4+/5p! 5/5 5/5  Hip external rotation 5/5 5/5 5/5 5/5  Knee flexion 5/5 5/5    Knee extension 5/5 5/5     (Blank rows = not tested)   LOWER EXTREMITY SPECIAL TESTS:  FABER: (+) on Rt FADDIR: (-) Trendelenburg sign: (+) BIL, Lt>Rt   FUNCTIONAL TESTS:  5xSTS: 38 seconds with UE support and increased BIL knee and Lt hip pain Squat: 50%, BIL knee crepitus throughout, pain in Lt hip SLS: Unable to perform without UE support  11/21/2021: 5xSTS: 21 seconds   GAIT: Distance walked: 38f Assistive device utilized: None Level of assistance: Complete Independence Comments: Rt antalgic gait with off-loading to Lt and decreased stance on Rt       TODAY'S TREATMENT:  [facility] Adult PT Treatment:                                                DATE: 11/21/2021 Therapeutic Exercise: Bridge isometric with marching 2x20 Supine 90/90 abdominal isometric with handhold resistance 3x30sec Hooklying LTR 2x10 Sidelying hip abduction with RTB above knees 2x10 with 5-sec hold BIL Sidelying IT band stretch with leg off edge of table x1 min BIL Manual Therapy: N/A Neuromuscular re-ed: N/A Therapeutic Activity: Re-administration of FOTO with pt education Re-assessment of objective measures with pt education Modalities: N/A Self Care: N/A   [facility] Adult PT Treatment:                                                DATE: 11/10/2021 [address] therapeutic pool temp 91 degrees Pt enters building ambulating independently.  Treatment took place in water 3.8 to  4 ft 8 in.feet deep depending upon activity.  Pt entered and exited the pool via stair and handrails independently.  Pt pain level  6/10 at initiation of water walking.  Therapeutic Exercise: Walking forward/backwards/side stepping Runners stretch on bottom step x30" BIL Hamstring stretch on bottom step x30" BIL Figure 4 squat stretch, BIL UE support 2x30" BIL STS from 3rd step from bottom, BIL UE support x10 Forward marching walk x1 lap holding yellow noodle At edge of  pool, pt performed LE exercise: Hip abd/add x20 BIL Hip ext/flex with knee straight x 20 BIL Marching hip flexion to knee extension 2x10 BIL Squats x20 (small range) Heel/toe raises x20 Hip flexion to 90 with ER/IR x10 each  Pt requires the buoyancy of water for active assisted exercises with buoyancy supported for strengthening and AROM exercises. Hydrostatic pressure also supports joints by unweighting joint load by at least 50 % in 3-4 feet depth water. 80% in chest to neck deep water. Water will provide assistance with movement using the current and laminar flow while the buoyancy reduces weight bearing. Pt requires the viscosity of the water for resistance with strengthening exercises.  [facility] Adult PT Treatment:                                                DATE: 11/15/2021 Therapeutic Exercise: Nustep level 4 x 6 mins Standing marching x10 BIL Standing hip abduction x10 BIL Standing hip extension x10 BIL  Standing hamstring curl x10 BIL Mini squats x10 Bridges 2x10 Supine hip adduction ball squeeze 5" hold 2x10 Supine clamshell GTB 2x10 Supine figure 4 piriformis stretch 2x30" BIL     PATIENT EDUCATION:  Education details: Pt educated on probable underlying pathophysiology behind her pain presentation, POC, prognosis, FOTO, and HEP Person educated: Patient Education method: Consulting civil engineer, Demonstration, and Handouts Education comprehension: verbalized understanding and returned demonstration     HOME EXERCISE PROGRAM: Access Code: [email or web address] URL: [email or web address] Date: 10/17/2021 Prepared by: Vanessa     Exercises - Standing ITB Stretch  - 1 x daily - 7 x weekly - 2-min hold - Seated Piriformis Stretch  - 1 x daily - 7 x weekly - 2-min hold - Hip Flexor Stretch at Edge of Bed  - 1 x daily - 7 x weekly - 2-min hold - Supine Bridge  - 1 x daily - 7 x weekly - 3 sets - 10 reps - 3-sec hold - Sidelying Hip Abduction  - 1 x daily - 7 x weekly - 3 sets - 10 reps - 3-sec hold   ASSESSMENT:   CLINICAL IMPRESSION: Upon re-assessment of objective measures, the pt has made excellent progress in global hip strength, FOTO score, functional standing/ walking ability, and 5xSTS. She tolerated progress exercises well today, demonstrating good form and mild increase in pain with the selected program. She will continue to benefit from skilled PT to address her primary impairments and return to her prior level of function with less limitation.  OBJECTIVE IMPAIRMENTS Abnormal gait, decreased activity tolerance, decreased balance, decreased endurance, decreased mobility, difficulty walking, decreased ROM, decreased strength, hypomobility, increased edema, impaired flexibility, improper body mechanics, postural dysfunction, and pain.    ACTIVITY LIMITATIONS carrying, lifting, bending, sitting, standing, squatting, sleeping, stairs, transfers, bed mobility, dressing, and locomotion level   PARTICIPATION LIMITATIONS: meal prep, cleaning, laundry, interpersonal relationship, driving, shopping, community activity, and yard work   PERSONAL FACTORS Past/current experiences, Time since onset of injury/illness/exacerbation, and 3+ comorbidities: See medical hx  are also affecting patient's functional outcome.        GOALS: Goals reviewed with patient? Yes   SHORT TERM GOALS: Target date: 11/14/2021    Pt will report understanding and adherence to initial HEP in order to promote independence in the management of primary impairments. Baseline: HEP provided at eval Patient reports adherence 11/14/21 Goal status: MET  LONG TERM GOALS: Target date: 12/13/2021    Pt will achieve a FOTO score of 51% in order to demonstrate improved functional ability as it relates to her primary impairments. Baseline: 36% 11/21/2021: 43% Goal status: PROGRESSING   2.  Pt will achieve a 5xSTS without use of UE in 20 seconds or less in order to promote safe transfers. Baseline: 38 seconds with UE support 11/21/2021: 21 seconds Goal status: PROGRESSING   3.  Pt will achieve 4+/5 global BIL hip strength in order to progress her exercise regimen with less limitation. Baseline: See MMT chart 11/21/2021: See updated MMT chart Goal status: PROGRESSING   4.  Pt will report ability to walk/ stand >10 minutes with 0-3/10 pain in order to grocery shop with less limitation. Baseline: Unable to walk/ stand any amount of time (>6/10 pain) 11/21/2021: Pt reports walking/ standing roughly 10 minutes with >6/10 pain Goal status: PROGRESSING       PLAN: PT FREQUENCY: 2x/week   PT DURATION: 8 weeks   PLANNED INTERVENTIONS: Therapeutic exercises, Therapeutic activity, Neuromuscular re-education, Balance training, Gait training, Patient/Family education, Joint manipulation, Joint mobilization, Stair training, DME instructions, Aquatic Therapy, Dry Needling, Electrical stimulation, Spinal mobilization, Cryotherapy, Moist heat, Taping, Vasopneumatic device, Biofeedback, Ionotophoresis 79m/ml Dexamethasone, Manual therapy, and Re-evaluation   PLAN FOR NEXT SESSION: Progress hip stretching/ strengthening, perform brief lumbar assessment as time allows    YVanessa Porter PT, DPT 11/21/21 2:43 PM

## 2021-11-23 NOTE — Therapy (Signed)
OUTPATIENT PHYSICAL THERAPY TREATMENT NOTE     Patient Name: Ariel Meyer MRN: [id number] DOB:[birth year], 71 y.o., female Today's Date: 11/21/2021   PCP: Leeroy Cha, MD REFERRING PROVIDER: Leeroy Cha, MD  END OF SESSION:   PT End of Session - 11/24/21 1149     Visit Number 8    Number of Visits 17    Date for PT Re-Evaluation 12/19/21    Authorization Type [employer] MCR    Authorization Time Period FOTO v6, v10, kx mod v15    Progress Note Due on Visit 10    PT Start Time 1145    PT Stop Time 1230    PT Time Calculation (min) 45 min    Activity Tolerance Patient tolerated treatment well;Patient limited by pain    Behavior During Therapy WFL for tasks assessed/performed                   Past Medical History:  Diagnosis Date   Anxiety    Arthritis    Back problem    Chronic kidney disease    Complication of anesthesia    wakes up durning surgery   Depression    Diabetes mellitus without complication (HCC)    Dizziness    GERD (gastroesophageal reflux disease)    Hyperlipidemia    Hypertension    Pneumonia    Seasonal allergies    Shortness of breath dyspnea    Sleep apnea    Past Surgical History:  Procedure Laterality Date   BACK SURGERY     KYPHOPLASTY N/A 08/12/2012   Procedure: KYPHOPLASTY;  Surgeon: Otilio Connors, MD;  Location: [facility];  Service: Neurosurgery;  Laterality: N/A;  L1 Balloon Kyphoplasty   NASAL SINUS SURGERY     TONSILLECTOMY     TUBAL LIGATION     Patient Active Problem List   Diagnosis Date Noted   Upper airway cough syndrome 06/21/2015   Acute encephalopathy 05/10/2014   Alcohol withdrawal delirium ([place]) 05/10/2014   Difficult intubation    Acute respiratory failure with hypoxia ([place])    Angioedema 05/06/2014   Goiter 05/06/2014   Hypertension 05/06/2014   Acute sinusitis 02/10/2014   Nausea with vomiting 02/10/2014   DM (diabetes mellitus), type 2 with renal complications ([place])  69/48/5462   Renal insufficiency 02/07/2014   Acute renal failure ([place]) 02/07/2014   Enteritis due to Clostridium difficile 02/07/2014   Hypotension 02/07/2014   Benign hypertension 02/07/2014   Obesity, morbid ([place]) 02/07/2014   GERD (gastroesophageal reflux disease) 02/07/2014   Hyperlipidemia 02/07/2014   Sciatica 06/10/2012   Lumbar compression fracture ([place]) 06/10/2012    REFERRING DIAG: M25.552 (ICD-10-CM) - Pain in left hip   THERAPY DIAG:  Pain in right hip  Pain in left hip  Difficulty in walking, not elsewhere classified  Muscle weakness (generalized)  Rationale for Evaluation and Treatment Rehabilitation  PERTINENT HISTORY: Anxiety, depression, hx of L1 balloon kyphoplasty in 2014, HTN, DMII, kidney disease  PRECAUTIONS: None  SUBJECTIVE: Patient reports pain and soreness in shoulder, RT hip and leg after last PT session, stating it has not gotten better yet.  PAIN:  Are you having pain? Yes: NPRS scale: 9.5/10 Pain location: Rt lateral hip Pain description: achy, sharp Aggravating factors: walking/ standing, laying on either side, getting into/ out of her car, getting out of bed in the morning Relieving factors: rest, heat   OBJECTIVE: (objective measures completed at initial evaluation unless otherwise dated)   DIAGNOSTIC FINDINGS: 12/20/2020: DG Hips  Bilat with or without Pelvis 3-4 Views: IMPRESSION: Negative.   PATIENT SURVEYS:  FOTO 36%, predicted 51% in 13 visits 11/21/2021: 43%   COGNITION:           Overall cognitive status: Within functional limits for tasks assessed                          SENSATION: Not tested     MUSCLE LENGTH: Ober's test: (+) BIL Thomas test: (+)   POSTURE: weight shift left   PALPATION: TTP to Lt/ Rt greater trochanter, Rt glute med   LOWER EXTREMITY ROM:   A/PROM Right eval Left eval  Hip flexion 95/110p! 90/110p!  Hip abduction 22/30p! 28/32p!  Hip adduction 17p! 15p!  Hip internal rotation 20/32p!  32/38p!  Hip external rotation 30/42 28/36   (Blank rows = not tested)   LOWER EXTREMITY MMT:   MMT Right eval Left eval Right 11/21/2021 Left 11/21/2021  Hip flexion 4/5 4/5 4/5 4+/5  Hip extension 3/5 3/5 4/5 4/5  Hip abduction 3/5p! 3/5p! 4+/5 3+/5p!  Hip internal rotation 5/5 4+/5p! 5/5 5/5  Hip external rotation 5/5 5/5 5/5 5/5  Knee flexion 5/5 5/5    Knee extension 5/5 5/5     (Blank rows = not tested)   LOWER EXTREMITY SPECIAL TESTS:  FABER: (+) on Rt FADDIR: (-) Trendelenburg sign: (+) BIL, Lt>Rt   FUNCTIONAL TESTS:  5xSTS: 38 seconds with UE support and increased BIL knee and Lt hip pain Squat: 50%, BIL knee crepitus throughout, pain in Lt hip SLS: Unable to perform without UE support  11/21/2021: 5xSTS: 21 seconds   GAIT: Distance walked: 41f Assistive device utilized: None Level of assistance: Complete Independence Comments: Rt antalgic gait with off-loading to Lt and decreased stance on Rt       [facility]                                                DATE: 11/24/2021 [address] therapeutic pool temp 92 degrees Pt enters building ambulating independently.  Treatment took place in water 3.8 to  4 ft 8 in.feet deep depending upon activity.  Pt entered and exited the pool via stair and handrails independently, but with decreased speed.  Pt pain level 9.5/10 at initiation of water walking.  Therapeutic Exercise: Walking forward/backwards/side stepping Runners stretch on bottom step x30" BIL Forward marching walk x1 lap holding yellow noodle At edge of pool, pt performed LE exercise: Hip abd/add x20 BIL Hip ext/flex with knee straight x 20 BIL Marching hip flexion to knee extension 2x10 BIL Squats 2x20 (small range) Heel/toe raises x20 Hip flexion to 90 with ER/IR x10 each  Pt requires the buoyancy of water for active assisted exercises with buoyancy supported for strengthening  and AROM exercises. Hydrostatic pressure also supports joints by unweighting joint load by at least 50 % in 3-4 feet depth water. 80% in chest to neck deep water. Water will provide assistance with movement using the current and laminar flow while the buoyancy reduces weight bearing. Pt requires the viscosity of the water for resistance with strengthening exercises.  [facility]  [facility]: [facility]20[facility] Therapeutic Exercise: Bridge isometric with marching 2x20 Supine 90/90 abdominal isometric with handhold resistance 3x30sec Hooklying LTR 2x10 Sidelying hip abduction with RTB above knees 2x10 with 5-sec hold BIL Sidelying IT band stretch with leg off edge of table x1 min BIL Manual Therapy: N/A Neuromuscular re-ed: N/A Therapeutic Activity: Re-administration of FOTO with pt education Re-assessment of objective measures with pt education Modalities: N/A Self Care: N/A   [facility] Adult PT Treatment:                                                DATE: 11/17/2021 Aquatic therapy at [address] - therapeutic pool temp 91 degrees Pt enters building ambulating independently.  Treatment took place in water 3.8 to  4 ft 8 in.feet deep depending upon activity.  Pt entered and exited the pool via stair and handrails independently.  Pt pain level 6/10 at initiation of water walking.  Therapeutic Exercise: Walking forward/backwards/side stepping Runners stretch on bottom step x30" BIL Hamstring stretch on bottom step x30" BIL Figure 4 squat stretch, BIL UE support 2x30" BIL STS from 3rd step from bottom, BIL UE support x10 Forward marching walk x1 lap holding yellow noodle At edge of pool, pt performed LE exercise: Hip abd/add x20 BIL Hip ext/flex with knee straight x 20 BIL Marching hip flexion to knee extension 2x10 BIL Squats x20 (small range) Heel/toe raises x20 Hip flexion to 90 with ER/IR x10 each  Pt requires the  buoyancy of water for active assisted exercises with buoyancy supported for strengthening and AROM exercises. Hydrostatic pressure also supports joints by unweighting joint load by at least 50 % in 3-4 feet depth water. 80% in chest to neck deep water. Water will provide assistance with movement using the current and laminar flow while the buoyancy reduces weight bearing. Pt requires the viscosity of the water for resistance with strengthening exercises.   PATIENT EDUCATION:  Education details: Pt educated on probable underlying pathophysiology behind her pain presentation, POC, prognosis, FOTO, and HEP Person educated: Patient Education method: Explanation, Demonstration, and Handouts Education comprehension: verbalized understanding and returned demonstration     HOME EXERCISE PROGRAM: Access Code: [email or web address] URL: [email or web address] Date: 10/17/2021 Prepared by: Vanessa Pence   Exercises - Standing ITB Stretch  - 1 x daily - 7 x weekly - 2-min hold - Seated Piriformis Stretch  - 1 x daily - 7 x weekly - 2-min hold - Hip Flexor Stretch at Edge of Bed  - 1 x daily - 7 x weekly - 2-min hold - Supine Bridge  - 1 x daily - 7 x weekly - 3 sets - 10 reps - 3-sec hold - Sidelying Hip Abduction  - 1 x daily - 7 x weekly - 3 sets - 10 reps - 3-sec hold   ASSESSMENT:   CLINICAL IMPRESSION: Patient presents to aquatic PT with elevated pain levels this session and reports the pain and soreness increased after her last land session. Session today continued to focus on LE strengthening and general conditioning in the aquatic environment for use of buoyancy to offload joints and the viscosity of water as resistance during therapeutic exercise. Patient was able to tolerate all prescribed exercises in the aquatic environment with no adverse effects and reports 7.5/10 pain at the end of the session. Patient continues to benefit from skilled PT services on  land and aquatic based and should  be progressed as able to improve functional independence.   OBJECTIVE IMPAIRMENTS Abnormal gait, decreased activity tolerance, decreased balance, decreased endurance, decreased mobility, difficulty walking, decreased ROM, decreased strength, hypomobility, increased edema, impaired flexibility, improper body mechanics, postural dysfunction, and pain.    ACTIVITY LIMITATIONS carrying, lifting, bending, sitting, standing, squatting, sleeping, stairs, transfers, bed mobility, dressing, and locomotion level   PARTICIPATION LIMITATIONS: meal prep, cleaning, laundry, interpersonal relationship, driving, shopping, community activity, and yard work   PERSONAL FACTORS Past/current experiences, Time since onset of injury/illness/exacerbation, and 3+ comorbidities: See medical hx  are also affecting patient's functional outcome.        GOALS: Goals reviewed with patient? Yes   SHORT TERM GOALS: Target date: 11/14/2021    Pt will report understanding and adherence to initial HEP in order to promote independence in the management of primary impairments. Baseline: HEP provided at eval Patient reports adherence 11/14/21 Goal status: MET     LONG TERM GOALS: Target date: 12/13/2021    Pt will achieve a FOTO score of 51% in order to demonstrate improved functional ability as it relates to her primary impairments. Baseline: 36% 11/21/2021: 43% Goal status: PROGRESSING   2.  Pt will achieve a 5xSTS without use of UE in 20 seconds or less in order to promote safe transfers. Baseline: 38 seconds with UE support 11/21/2021: 21 seconds Goal status: PROGRESSING   3.  Pt will achieve 4+/5 global BIL hip strength in order to progress her exercise regimen with less limitation. Baseline: See MMT chart 11/21/2021: See updated MMT chart Goal status: PROGRESSING   4.  Pt will report ability to walk/ stand >10 minutes with 0-3/10 pain in order to grocery shop with less limitation. Baseline: Unable to walk/ stand any  amount of time (>6/10 pain) 11/21/2021: Pt reports walking/ standing roughly 10 minutes with >6/10 pain Goal status: PROGRESSING       PLAN: PT FREQUENCY: 2x/week   PT DURATION: 8 weeks   PLANNED INTERVENTIONS: Therapeutic exercises, Therapeutic activity, Neuromuscular re-education, Balance training, Gait training, Patient/Family education, Joint manipulation, Joint mobilization, Stair training, DME instructions, Aquatic Therapy, Dry Needling, Electrical stimulation, Spinal mobilization, Cryotherapy, Moist heat, Taping, Vasopneumatic device, Biofeedback, Ionotophoresis 57m/ml Dexamethasone, Manual therapy, and Re-evaluation   PLAN FOR NEXT SESSION: Progress hip stretching/ strengthening, perform brief lumbar assessment as time allows    SEvelene Croon PTA 11/24/21 12:32 PM

## 2021-11-24 ENCOUNTER — Ambulatory Visit: Payer: Medicare PPO

## 2021-11-24 DIAGNOSIS — M25552 Pain in left hip: Secondary | ICD-10-CM

## 2021-11-24 DIAGNOSIS — M25551 Pain in right hip: Secondary | ICD-10-CM

## 2021-11-24 DIAGNOSIS — R262 Difficulty in walking, not elsewhere classified: Secondary | ICD-10-CM

## 2021-11-24 DIAGNOSIS — M6281 Muscle weakness (generalized): Secondary | ICD-10-CM | POA: Diagnosis not present

## 2021-11-28 ENCOUNTER — Ambulatory Visit: Payer: Medicare PPO | Admitting: Physical Therapy

## 2021-11-28 DIAGNOSIS — I10 Essential (primary) hypertension: Secondary | ICD-10-CM | POA: Diagnosis not present

## 2021-11-28 DIAGNOSIS — G4733 Obstructive sleep apnea (adult) (pediatric): Secondary | ICD-10-CM | POA: Diagnosis not present

## 2021-11-28 DIAGNOSIS — I951 Orthostatic hypotension: Secondary | ICD-10-CM | POA: Diagnosis not present

## 2021-11-28 DIAGNOSIS — F329 Major depressive disorder, single episode, unspecified: Secondary | ICD-10-CM | POA: Diagnosis not present

## 2021-11-28 DIAGNOSIS — E669 Obesity, unspecified: Secondary | ICD-10-CM | POA: Diagnosis not present

## 2021-11-28 DIAGNOSIS — F419 Anxiety disorder, unspecified: Secondary | ICD-10-CM | POA: Diagnosis not present

## 2021-11-28 DIAGNOSIS — E785 Hyperlipidemia, unspecified: Secondary | ICD-10-CM | POA: Diagnosis not present

## 2021-11-28 DIAGNOSIS — E1122 Type 2 diabetes mellitus with diabetic chronic kidney disease: Secondary | ICD-10-CM | POA: Diagnosis not present

## 2021-11-28 DIAGNOSIS — J45909 Unspecified asthma, uncomplicated: Secondary | ICD-10-CM | POA: Diagnosis not present

## 2021-11-28 DIAGNOSIS — Z888 Allergy status to other drugs, medicaments and biological substances status: Secondary | ICD-10-CM | POA: Diagnosis not present

## 2021-12-02 ENCOUNTER — Ambulatory Visit: Payer: Medicare PPO | Admitting: Physical Therapy

## 2021-12-02 ENCOUNTER — Encounter: Payer: Self-pay | Admitting: Physical Therapy

## 2021-12-02 DIAGNOSIS — M25551 Pain in right hip: Secondary | ICD-10-CM

## 2021-12-02 DIAGNOSIS — M25552 Pain in left hip: Secondary | ICD-10-CM

## 2021-12-02 DIAGNOSIS — R262 Difficulty in walking, not elsewhere classified: Secondary | ICD-10-CM

## 2021-12-02 DIAGNOSIS — M6281 Muscle weakness (generalized): Secondary | ICD-10-CM | POA: Diagnosis not present

## 2021-12-02 NOTE — Therapy (Signed)
OUTPATIENT PHYSICAL THERAPY TREATMENT NOTE     Patient Name: Ariel Meyer MRN: [id number] DOB:04-03-1951, 71 y.o., female Today's Date: 11/21/2021   PCP: Ariel Cha, MD REFERRING PROVIDER: Leeroy Cha, MD  END OF SESSION:   PT End of Session - 12/02/21 1032     Visit Number 9    Number of Visits 17    Date for PT Re-Evaluation 12/19/21    Authorization Type [employer] M[employer] - new auth requested 7/22    Authorization Time Period FOTO v6, v10, kx mod v15    Progress Note Due on Visit 10    PT Start Time 1031    PT Stop Time 1111    PT Time Calculation (min) 40 min    Activity Tolerance Patient tolerated treatment well;Patient limited by pain    Behavior During Therapy WFL for tasks assessed/performed                   Past Medical History:  Diagnosis Date   Anxiety    Arthritis    Back problem    Chronic kidney disease    Complication of anesthesia    wakes up durning surgery   Depression    Diabetes mellitus without complication ([place])    Dizziness    GERD (gastroesophageal reflux disease)    Hyperlipidemia    Hypertension    Pneumonia    Seasonal allergies    Shortness of breath dyspnea    Sleep apnea    Past Surgical History:  Procedure Laterality Date   BACK SURGERY     KYPHOPLASTY N/A 08/12/2012   Procedure: KYPHOPLASTY;  Surgeon: Ariel Connors, MD;  Location: [facility];  Service: Neurosurgery;  Laterality: N/A;  L1 Balloon Kyphoplasty   NASAL SINUS SURGERY     TONSILLECTOMY     TUBAL LIGATION     Patient Active Problem List   Diagnosis Date Noted   Upper airway cough syndrome 06/21/2015   Acute encephalopathy 05/10/2014   Alcohol withdrawal delirium ([place]) 05/10/2014   Difficult intubation    Acute respiratory failure with hypoxia ([place])    Angioedema 05/06/2014   Goiter 05/06/2014   Hypertension 05/06/2014   Acute sinusitis 02/10/2014   Nausea with vomiting 02/10/2014   DM (diabetes mellitus), type 2 with  renal complications ([place]) 54/65/0354   Renal insufficiency 02/07/2014   Acute renal failure ([place]) 02/07/2014   Enteritis due to Clostridium difficile 02/07/2014   Hypotension 02/07/2014   Benign hypertension 02/07/2014   Obesity, morbid ([place]) 02/07/2014   GERD (gastroesophageal reflux disease) 02/07/2014   Hyperlipidemia 02/07/2014   Sciatica 06/10/2012   Lumbar compression fracture ([place]) 06/10/2012    REFERRING DIAG: M25.552 (ICD-10-CM) - Pain in left hip   THERAPY DIAG:  Pain in right hip  Pain in left hip  Difficulty in walking, not elsewhere classified  Rationale for Evaluation and Treatment Rehabilitation  PERTINENT HISTORY: Anxiety, depression, hx of L1 balloon kyphoplasty in 2014, HTN, DMII, kidney disease  PRECAUTIONS: None  SUBJECTIVE: Pt reports that her back and shoulders are sore after having to pick up he husband after she fell out of bed.  PAIN:  Are you having pain? Yes: NPRS scale: 5/10 Pain location: Rt lateral hip Pain description: achy, sharp Aggravating factors: walking/ standing, laying on either side, getting into/ out of her car, getting out of bed in the morning Relieving factors: rest, heat   OBJECTIVE: (objective measures completed at initial evaluation unless otherwise dated)   DIAGNOSTIC FINDINGS: 12/20/2020: Ariel Meyer  Hips Bilat with or without Pelvis 3-4 Views: IMPRESSION: Negative.   PATIENT SURVEYS:  FOTO 36%, predicted 51% in 13 visits 11/21/2021: 43%   COGNITION:           Overall cognitive status: Within functional limits for tasks assessed                          SENSATION: Not tested     MUSCLE LENGTH: Ober's test: (+) BIL Thomas test: (+)   POSTURE: weight shift left   PALPATION: TTP to Lt/ Rt greater trochanter, Rt glute med   LOWER EXTREMITY ROM:   A/PROM Right eval Left eval  Hip flexion 95/110p! 90/110p!  Hip abduction 22/30p! 28/32p!  Hip adduction 17p! 15p!  Hip internal rotation 20/32p! 32/38p!  Hip external  rotation 30/42 28/36   (Blank rows = not tested)   LOWER EXTREMITY MMT:   MMT Right eval Left eval Right 11/21/2021 Left 11/21/2021  Hip flexion 4/5 4/5 4/5 4+/5  Hip extension 3/5 3/5 4/5 4/5  Hip abduction 3/5p! 3/5p! 4+/5 3+/5p!  Hip internal rotation 5/5 4+/5p! 5/5 5/5  Hip external rotation 5/5 5/5 5/5 5/5  Knee flexion 5/5 5/5    Knee extension 5/5 5/5     (Blank rows = not tested)   LOWER EXTREMITY SPECIAL TESTS:  FABER: (+) on Rt FADDIR: (-) Trendelenburg sign: (+) BIL, Lt>Rt   FUNCTIONAL TESTS:  5xSTS: 38 seconds with UE support and increased BIL knee and Lt hip pain Squat: 50%, BIL knee crepitus throughout, pain in Lt hip SLS: Unable to perform without UE support  11/21/2021: 5xSTS: 21 seconds   GAIT: Distance walked: 37f Assistive device utilized: None Level of assistance: Complete Independence Comments: Rt antalgic gait with off-loading to Lt and decreased stance on Rt       TODAY'S TREATMENT:  [facility] Adult PT Treatment:                                                DATE: 12/02/2021 Therapeutic Exercise: nu-step L5 523mhile taking subjective and planning session with patient Hooklying LTR 2x10 Supine clam - GTB - 3x10 Hip adduction squeeze - 5'' hold 2x10 STS from raised seat - 2x5 - 2 airex  Neuromuscular re-ed: Tandem stance - 4570 bouts Rocker board DF/PF (blue)  Manual Therapy LAD R hip   [facility] Adult PT Treatment:                                                DATE: 11/24/2021 Aquatic therapy at [address] - therapeutic pool temp 92 degrees Pt enters building ambulating independently.  Treatment took place in water 3.8 to  4 ft 8 in.feet deep depending upon activity.  Pt entered and exited the pool via stair and handrails independently, but with decreased speed.  Pt pain level 9.5/10 at initiation of water walking.  Therapeutic Exercise: Walking forward/backwards/side stepping Runners stretch on bottom step x30"  BIL Forward marching walk x1 lap holding yellow noodle At edge of pool, pt performed LE exercise: Hip abd/add x20 BIL Hip ext/flex with knee straight x 20 BIL Marching hip flexion to knee extension 2x10 BIL Squats 2x20 (small  range) Heel/toe raises x20 Hip flexion to 90 with ER/IR x10 each  Pt requires the buoyancy of water for active assisted exercises with buoyancy supported for strengthening and AROM exercises. Hydrostatic pressure also supports joints by unweighting joint load by at least 50 % in 3-4 feet depth water. 80% in chest to neck deep water. Water will provide assistance with movement using the current and laminar flow while the buoyancy reduces weight bearing. Pt requires the viscosity of the water for resistance with strengthening exercises.  [facility] Adult PT Treatment:                                                DATE: 11/21/2021 Therapeutic Exercise: Bridge isometric with marching 2x20 Supine 90/90 abdominal isometric with handhold resistance 3x30sec Hooklying LTR 2x10 Sidelying hip abduction with RTB above knees 2x10 with 5-sec hold BIL Sidelying IT band stretch with leg off edge of table x1 min BIL Manual Therapy: N/A Neuromuscular re-ed: N/A Therapeutic Activity: Re-administration of FOTO with pt education Re-assessment of objective measures with pt education Modalities: N/A Self Care: N/A   [facility] Adult PT Treatment:                                                DATE: 11/17/2021 Aquatic therapy at [address] - therapeutic pool temp 91 degrees Pt enters building ambulating independently.  Treatment took place in water 3.8 to  4 ft 8 in.feet deep depending upon activity.  Pt entered and exited the pool via stair and handrails independently.  Pt pain level 6/10 at initiation of water walking.  Therapeutic Exercise: Walking forward/backwards/side stepping Runners stretch on bottom step x30" BIL Hamstring stretch on bottom step x30" BIL Figure  4 squat stretch, BIL UE support 2x30" BIL STS from 3rd step from bottom, BIL UE support x10 Forward marching walk x1 lap holding yellow noodle At edge of pool, pt performed LE exercise: Hip abd/add x20 BIL Hip ext/flex with knee straight x 20 BIL Marching hip flexion to knee extension 2x10 BIL Squats x20 (small range) Heel/toe raises x20 Hip flexion to 90 with ER/IR x10 each  Pt requires the buoyancy of water for active assisted exercises with buoyancy supported for strengthening and AROM exercises. Hydrostatic pressure also supports joints by unweighting joint load by at least 50 % in 3-4 feet depth water. 80% in chest to neck deep water. Water will provide assistance with movement using the current and laminar flow while the buoyancy reduces weight bearing. Pt requires the viscosity of the water for resistance with strengthening exercises.   PATIENT EDUCATION:  Education details: Pt educated on probable underlying pathophysiology behind her pain presentation, POC, prognosis, FOTO, and HEP Person educated: Patient Education method: Explanation, Demonstration, and Handouts Education comprehension: verbalized understanding and returned demonstration     HOME EXERCISE PROGRAM: Access Code: [email or web address] URL: [email or web address] Date: 10/17/2021 Prepared by: Vanessa Arroyo   Exercises - Standing ITB Stretch  - 1 x daily - 7 x weekly - 2-min hold - Seated Piriformis Stretch  - 1 x daily - 7 x weekly - 2-min hold - Hip Flexor Stretch at Edge of Bed  - 1 x daily - 7 x weekly - 2-min hold -  Supine Bridge  - 1 x daily - 7 x weekly - 3 sets - 10 reps - 3-sec hold - Sidelying Hip Abduction  - 1 x daily - 7 x weekly - 3 sets - 10 reps - 3-sec hold   ASSESSMENT:   CLINICAL IMPRESSION: Meshell did well with therapy today.  Pt reports pain reduction following MT and therex.  We added in some balance today to good effect.  Will continue to progress intensity as  able.   OBJECTIVE IMPAIRMENTS Abnormal gait, decreased activity tolerance, decreased balance, decreased endurance, decreased mobility, difficulty walking, decreased ROM, decreased strength, hypomobility, increased edema, impaired flexibility, improper body mechanics, postural dysfunction, and pain.    ACTIVITY LIMITATIONS carrying, lifting, bending, sitting, standing, squatting, sleeping, stairs, transfers, bed mobility, dressing, and locomotion level   PARTICIPATION LIMITATIONS: meal prep, cleaning, laundry, interpersonal relationship, driving, shopping, community activity, and yard work   PERSONAL FACTORS Past/current experiences, Time since onset of injury/illness/exacerbation, and 3+ comorbidities: See medical hx  are also affecting patient's functional outcome.        GOALS: Goals reviewed with patient? Yes   SHORT TERM GOALS: Target date: 11/14/2021    Pt will report understanding and adherence to initial HEP in order to promote independence in the management of primary impairments. Baseline: HEP provided at eval Patient reports adherence 11/14/21 Goal status: MET     LONG TERM GOALS: Target date: 12/13/2021    Pt will achieve a FOTO score of 51% in order to demonstrate improved functional ability as it relates to her primary impairments. Baseline: 36% 11/21/2021: 43% Goal status: PROGRESSING   2.  Pt will achieve a 5xSTS without use of UE in 20 seconds or less in order to promote safe transfers. Baseline: 38 seconds with UE support 11/21/2021: 21 seconds Goal status: PROGRESSING   3.  Pt will achieve 4+/5 global BIL hip strength in order to progress her exercise regimen with less limitation. Baseline: See MMT chart 11/21/2021: See updated MMT chart Goal status: PROGRESSING   4.  Pt will report ability to walk/ stand >10 minutes with 0-3/10 pain in order to grocery shop with less limitation. Baseline: Unable to walk/ stand any amount of time (>6/10 pain) 11/21/2021: Pt reports  walking/ standing roughly 10 minutes with >6/10 pain Goal status: PROGRESSING       PLAN: PT FREQUENCY: 2x/week   PT DURATION: 8 weeks   PLANNED INTERVENTIONS: Therapeutic exercises, Therapeutic activity, Neuromuscular re-education, Balance training, Gait training, Patient/Family education, Joint manipulation, Joint mobilization, Stair training, DME instructions, Aquatic Therapy, Dry Needling, Electrical stimulation, Spinal mobilization, Cryotherapy, Moist heat, Taping, Vasopneumatic device, Biofeedback, Ionotophoresis 64m/ml Dexamethasone, Manual therapy, and Re-evaluation   PLAN FOR NEXT SESSION: Progress hip stretching/ strengthening, perform brief lumbar assessment as time allows    KKevan NyReinhartsen PT 12/02/21 11:15 AM

## 2021-12-07 ENCOUNTER — Encounter: Payer: Self-pay | Admitting: Physical Therapy

## 2021-12-07 ENCOUNTER — Ambulatory Visit: Payer: Medicare PPO | Admitting: Physical Therapy

## 2021-12-07 DIAGNOSIS — M6281 Muscle weakness (generalized): Secondary | ICD-10-CM | POA: Diagnosis not present

## 2021-12-07 DIAGNOSIS — R262 Difficulty in walking, not elsewhere classified: Secondary | ICD-10-CM | POA: Diagnosis not present

## 2021-12-07 DIAGNOSIS — M25551 Pain in right hip: Secondary | ICD-10-CM | POA: Diagnosis not present

## 2021-12-07 DIAGNOSIS — M25552 Pain in left hip: Secondary | ICD-10-CM

## 2021-12-07 NOTE — Therapy (Signed)
Progress Note Reporting Period 6/6 to 7/27  See note below for Objective Data and Assessment of Progress/Goals.        Patient Name: Ariel Meyer MRN: [id number] DOB:27-Oct-1950, 71 y.o., female Today's Date: 11/21/2021   PCP: Leeroy Cha, MD REFERRING PROVIDER: Leeroy Cha, MD  END OF SESSION:   PT End of Session - 12/07/21 1135     Visit Number 10    Number of Visits 17    Date for PT Re-Evaluation 12/19/21    Authorization Type [employer] MCR - new auth requested 7/22    Authorization Time Period FOTO v6, v10, kx mod v15    Progress Note Due on Visit 68    PT Start Time 1130    PT Stop Time 1211    PT Time Calculation (min) 41 min    Activity Tolerance Patient tolerated treatment well;Patient limited by pain    Behavior During Therapy WFL for tasks assessed/performed                   Past Medical History:  Diagnosis Date   Anxiety    Arthritis    Back problem    Chronic kidney disease    Complication of anesthesia    wakes up durning surgery   Depression    Diabetes mellitus without complication ([place])    Dizziness    GERD (gastroesophageal reflux disease)    Hyperlipidemia    Hypertension    Pneumonia    Seasonal allergies    Shortness of breath dyspnea    Sleep apnea    Past Surgical History:  Procedure Laterality Date   BACK SURGERY     KYPHOPLASTY N/A 08/12/2012   Procedure: KYPHOPLASTY;  Surgeon: Otilio Connors, MD;  Location: [facility];  Service: Neurosurgery;  Laterality: N/A;  L1 Balloon Kyphoplasty   NASAL SINUS SURGERY     TONSILLECTOMY     TUBAL LIGATION     Patient Active Problem List   Diagnosis Date Noted   Upper airway cough syndrome 06/21/2015   Acute encephalopathy 05/10/2014   Alcohol withdrawal delirium ([place]) 05/10/2014   Difficult intubation    Acute respiratory failure with hypoxia ([place])    Angioedema 05/06/2014   Goiter 05/06/2014   Hypertension 05/06/2014   Acute sinusitis 02/10/2014    Nausea with vomiting 02/10/2014   DM (diabetes mellitus), type 2 with renal complications ([place]) 27/07/5007   Renal insufficiency 02/07/2014   Acute renal failure ([place]) 02/07/2014   Enteritis due to Clostridium difficile 02/07/2014   Hypotension 02/07/2014   Benign hypertension 02/07/2014   Obesity, morbid ([place]) 02/07/2014   GERD (gastroesophageal reflux disease) 02/07/2014   Hyperlipidemia 02/07/2014   Sciatica 06/10/2012   Lumbar compression fracture ([place]) 06/10/2012    REFERRING DIAG: M25.552 (ICD-10-CM) - Pain in left hip   THERAPY DIAG:  Pain in right hip  Pain in left hip  Rationale for Evaluation and Treatment Rehabilitation  PERTINENT HISTORY: Anxiety, depression, hx of L1 balloon kyphoplasty in 2014, HTN, DMII, kidney disease  PRECAUTIONS: None  SUBJECTIVE: Pt reports that her back and shoulders are sore after having to pick up he husband after she fell out of bed.  PAIN:  Are you having pain? Yes: NPRS scale: 5/10 Pain location: Rt lateral hip Pain description: achy, sharp Aggravating factors: walking/ standing, laying on either side, getting into/ out of her car, getting out of bed in the morning Relieving factors: rest, heat   OBJECTIVE: (objective measures completed at initial evaluation  unless otherwise dated)   DIAGNOSTIC FINDINGS: 12/20/2020: DG Hips Bilat with or without Pelvis 3-4 Views: IMPRESSION: Negative.   PATIENT SURVEYS:  FOTO 36%, predicted 51% in 13 visits 11/21/2021: 43%   COGNITION:           Overall cognitive status: Within functional limits for tasks assessed                          SENSATION: Not tested     MUSCLE LENGTH: Ober's test: (+) BIL Thomas test: (+)   POSTURE: weight shift left   PALPATION: TTP to Lt/ Rt greater trochanter, Rt glute med   LOWER EXTREMITY ROM:   A/PROM Right eval Left eval  Hip flexion 95/110p! 90/110p!  Hip abduction 22/30p! 28/32p!  Hip adduction 17p! 15p!  Hip internal rotation 20/32p!  32/38p!  Hip external rotation 30/42 28/36   (Blank rows = not tested)   LOWER EXTREMITY MMT:   MMT Right eval Left eval Right 11/21/2021 Left 11/21/2021  Hip flexion 4/5 4/5 4/5 4+/5  Hip extension 3/5 3/5 4/5 4/5  Hip abduction 3/5p! 3/5p! 4+/5 3+/5p!  Hip internal rotation 5/5 4+/5p! 5/5 5/5  Hip external rotation 5/5 5/5 5/5 5/5  Knee flexion 5/5 5/5    Knee extension 5/5 5/5     (Blank rows = not tested)   LOWER EXTREMITY SPECIAL TESTS:  FABER: (+) on Rt FADDIR: (-) Trendelenburg sign: (+) BIL, Lt>Rt   FUNCTIONAL TESTS:  5xSTS: 38 seconds with UE support and increased BIL knee and Lt hip pain Squat: 50%, BIL knee crepitus throughout, pain in Lt hip SLS: Unable to perform without UE support  11/21/2021: 5xSTS: 21 seconds   GAIT: Distance walked: 68f Assistive device utilized: None Level of assistance: Complete Independence Comments: Rt antalgic gait with off-loading to Lt and decreased stance on Rt       TODAY'S TREATMENT:  [facility] Adult PT Treatment:                                                DATE: 12/07/2021 Therapeutic Exercise: nu-step L5 543mhile taking subjective and planning session with patient Hooklying LTR 2x10 Supine clam alternating - Blue TB - 3x10 Bridge - 3x10 - with blue TB for ER cue Hip adduction squeeze with pilates ring - 3'' hold 2x10 HL march with blue TB around knees STS from raised seat - 2x6 - 2 airex  Neuromuscular re-ed: Tandem stance - 4588 bouts Rocker board DF/PF (blue)  Manual Therapy LAD R hip   [facility] Adult PT Treatment:                                                DATE: 11/24/2021 Aquatic therapy at [address] - therapeutic pool temp 92 degrees Pt enters building ambulating independently.  Treatment took place in water 3.8 to  4 ft 8 in.feet deep depending upon activity.  Pt entered and exited the pool via stair and handrails independently, but with decreased speed.  Pt pain level 9.5/10 at  initiation of water walking.  Therapeutic Exercise: Walking forward/backwards/side stepping Runners stretch on bottom step x30" BIL Forward marching walk x1 lap holding yellow noodle  At edge of pool, pt performed LE exercise: Hip abd/add x20 BIL Hip ext/flex with knee straight x 20 BIL Marching hip flexion to knee extension 2x10 BIL Squats 2x20 (small range) Heel/toe raises x20 Hip flexion to 90 with ER/IR x10 each  Pt requires the buoyancy of water for active assisted exercises with buoyancy supported for strengthening and AROM exercises. Hydrostatic pressure also supports joints by unweighting joint load by at least 50 % in 3-4 feet depth water. 80% in chest to neck deep water. Water will provide assistance with movement using the current and laminar flow while the buoyancy reduces weight bearing. Pt requires the viscosity of the water for resistance with strengthening exercises.  [facility] Adult PT Treatment:                                                DATE: 11/21/2021 Therapeutic Exercise: Bridge isometric with marching 2x20 Supine 90/90 abdominal isometric with handhold resistance 3x30sec Hooklying LTR 2x10 Sidelying hip abduction with RTB above knees 2x10 with 5-sec hold BIL Sidelying IT band stretch with leg off edge of table x1 min BIL Manual Therapy: N/A Neuromuscular re-ed: N/A Therapeutic Activity: Re-administration of FOTO with pt education Re-assessment of objective measures with pt education Modalities: N/A Self Care: N/A   [facility] Adult PT Treatment:                                                DATE: 11/17/2021 Aquatic therapy at [address] - therapeutic pool temp 91 degrees Pt enters building ambulating independently.  Treatment took place in water 3.8 to  4 ft 8 in.feet deep depending upon activity.  Pt entered and exited the pool via stair and handrails independently.  Pt pain level 6/10 at initiation of water walking.  Therapeutic  Exercise: Walking forward/backwards/side stepping Runners stretch on bottom step x30" BIL Hamstring stretch on bottom step x30" BIL Figure 4 squat stretch, BIL UE support 2x30" BIL STS from 3rd step from bottom, BIL UE support x10 Forward marching walk x1 lap holding yellow noodle At edge of pool, pt performed LE exercise: Hip abd/add x20 BIL Hip ext/flex with knee straight x 20 BIL Marching hip flexion to knee extension 2x10 BIL Squats x20 (small range) Heel/toe raises x20 Hip flexion to 90 with ER/IR x10 each  Pt requires the buoyancy of water for active assisted exercises with buoyancy supported for strengthening and AROM exercises. Hydrostatic pressure also supports joints by unweighting joint load by at least 50 % in 3-4 feet depth water. 80% in chest to neck deep water. Water will provide assistance with movement using the current and laminar flow while the buoyancy reduces weight bearing. Pt requires the viscosity of the water for resistance with strengthening exercises.   PATIENT EDUCATION:  Education details: Pt educated on probable underlying pathophysiology behind her pain presentation, POC, prognosis, FOTO, and HEP Person educated: Patient Education method: Explanation, Demonstration, and Handouts Education comprehension: verbalized understanding and returned demonstration     HOME EXERCISE PROGRAM: Access Code: [email or web address] URL: [email or web address] Date: 10/17/2021 Prepared by: Vanessa [place]   Exercises - Standing ITB Stretch  - 1 x daily - 7 x weekly - 2-min hold - Seated Piriformis Stretch  -  1 x daily - 7 x weekly - 2-min hold - Hip Flexor Stretch at Edge of Bed  - 1 x daily - 7 x weekly - 2-min hold - Supine Bridge  - 1 x daily - 7 x weekly - 3 sets - 10 reps - 3-sec hold - Sidelying Hip Abduction  - 1 x daily - 7 x weekly - 3 sets - 10 reps - 3-sec hold   ASSESSMENT:   CLINICAL IMPRESSION: Y[place]ved has progressed well with therapy.  Improved  impairments include: LE strength and pain.  Functional improvements include: transfers, subjective report of feeling more steady while walking.  Progressions needed include: HEP, work on steps, progressive hip and core strengthening.  Barriers to progress include: chronic pain and high levels of pain.  Please see GOALS section for progress on short term and long term goals established at evaluation.  I recommend continuation of PT to allow completion of remaining goals and continued functional progression.   OBJECTIVE IMPAIRMENTS Abnormal gait, decreased activity tolerance, decreased balance, decreased endurance, decreased mobility, difficulty walking, decreased ROM, decreased strength, hypomobility, increased edema, impaired flexibility, improper body mechanics, postural dysfunction, and pain.    ACTIVITY LIMITATIONS carrying, lifting, bending, sitting, standing, squatting, sleeping, stairs, transfers, bed mobility, dressing, and locomotion level   PARTICIPATION LIMITATIONS: meal prep, cleaning, laundry, interpersonal relationship, driving, shopping, community activity, and yard work   PERSONAL FACTORS Past/current experiences, Time since onset of injury/illness/exacerbation, and 3+ comorbidities: See medical hx  are also affecting patient's functional outcome.        GOALS: Goals reviewed with patient? Yes   SHORT TERM GOALS: Target date: 11/14/2021    Pt will report understanding and adherence to initial HEP in order to promote independence in the management of primary impairments. Baseline: HEP provided at eval Patient reports adherence 11/14/21 Goal status: MET     LONG TERM GOALS: Target date: 12/13/2021    Pt will achieve a FOTO score of 51% in order to demonstrate improved functional ability as it relates to her primary impairments. Baseline: 36% 11/21/2021: 43% Goal status: PROGRESSING   2.  Pt will achieve a 5xSTS without use of UE in 20 seconds or less in order to promote safe  transfers. Baseline: 38 seconds with UE support 11/21/2021: 21 seconds Goal status: PROGRESSING   3.  Pt will achieve 4+/5 global BIL hip strength in order to progress her exercise regimen with less limitation. Baseline: See MMT chart 11/21/2021: See updated MMT chart Goal status: PROGRESSING   4.  Pt will report ability to walk/ stand >10 minutes with 0-3/10 pain in order to grocery shop with less limitation. Baseline: Unable to walk/ stand any amount of time (>6/10 pain) 11/21/2021: Pt reports walking/ standing roughly 10 minutes with >6/10 pain Goal status: PROGRESSING       PLAN: PT FREQUENCY: 2x/week   PT DURATION: 8 weeks   PLANNED INTERVENTIONS: Therapeutic exercises, Therapeutic activity, Neuromuscular re-education, Balance training, Gait training, Patient/Family education, Joint manipulation, Joint mobilization, Stair training, DME instructions, Aquatic Therapy, Dry Needling, Electrical stimulation, Spinal mobilization, Cryotherapy, Moist heat, Taping, Vasopneumatic device, Biofeedback, Ionotophoresis 67m/ml Dexamethasone, Manual therapy, and Re-evaluation   PLAN FOR NEXT SESSION: Progress hip stretching/ strengthening, perform brief lumbar assessment as time allows    KMathis DadPT 12/07/21 12:14 PM

## 2021-12-09 ENCOUNTER — Ambulatory Visit: Payer: Medicare PPO

## 2021-12-09 DIAGNOSIS — M25551 Pain in right hip: Secondary | ICD-10-CM

## 2021-12-09 DIAGNOSIS — M25552 Pain in left hip: Secondary | ICD-10-CM

## 2021-12-09 DIAGNOSIS — M6281 Muscle weakness (generalized): Secondary | ICD-10-CM | POA: Diagnosis not present

## 2021-12-09 DIAGNOSIS — R262 Difficulty in walking, not elsewhere classified: Secondary | ICD-10-CM

## 2021-12-09 NOTE — Therapy (Signed)
Progress Note Reporting Period 6/6 to 7/27  See note below for Objective Data and Assessment of Progress/Goals.        Patient Name: Ariel Meyer MRN: [id number] DOB:11-Feb-1951, 71 y.o., female Today's Date: 11/21/2021   PCP: Leeroy Cha, MD REFERRING PROVIDER: Leeroy Cha, MD  END OF SESSION:   PT End of Session - 12/09/21 0955     Visit Number 11    Number of Visits 17    Date for PT Re-Evaluation 12/19/21    Authorization Type [employer] MCR - new auth requested 7/22    Authorization Time Period FOTO v6, v10, kx mod v15    Progress Note Due on Visit 20    PT Start Time 0945    PT Stop Time 1025    PT Time Calculation (min) 40 min    Activity Tolerance Patient tolerated treatment well;Patient limited by pain    Behavior During Therapy WFL for tasks assessed/performed                    Past Medical History:  Diagnosis Date   Anxiety    Arthritis    Back problem    Chronic kidney disease    Complication of anesthesia    wakes up durning surgery   Depression    Diabetes mellitus without complication ([place])    Dizziness    GERD (gastroesophageal reflux disease)    Hyperlipidemia    Hypertension    Pneumonia    Seasonal allergies    Shortness of breath dyspnea    Sleep apnea    Past Surgical History:  Procedure Laterality Date   BACK SURGERY     KYPHOPLASTY N/A 08/12/2012   Procedure: KYPHOPLASTY;  Surgeon: Otilio Connors, MD;  Location: [facility];  Service: Neurosurgery;  Laterality: N/A;  L1 Balloon Kyphoplasty   NASAL SINUS SURGERY     TONSILLECTOMY     TUBAL LIGATION     Patient Active Problem List   Diagnosis Date Noted   Upper airway cough syndrome 06/21/2015   Acute encephalopathy 05/10/2014   Alcohol withdrawal delirium ([place]) 05/10/2014   Difficult intubation    Acute respiratory failure with hypoxia ([place])    Angioedema 05/06/2014   Goiter 05/06/2014   Hypertension 05/06/2014   Acute sinusitis  02/10/2014   Nausea with vomiting 02/10/2014   DM (diabetes mellitus), type 2 with renal complications ([place]) 28/31/5176   Renal insufficiency 02/07/2014   Acute renal failure ([place]) 02/07/2014   Enteritis due to Clostridium difficile 02/07/2014   Hypotension 02/07/2014   Benign hypertension 02/07/2014   Obesity, morbid ([place]) 02/07/2014   GERD (gastroesophageal reflux disease) 02/07/2014   Hyperlipidemia 02/07/2014   Sciatica 06/10/2012   Lumbar compression fracture ([place]) 06/10/2012    REFERRING DIAG: M25.552 (ICD-10-CM) - Pain in left hip   THERAPY DIAG:  Pain in right hip  Pain in left hip  Difficulty in walking, not elsewhere classified  Muscle weakness (generalized)  Rationale for Evaluation and Treatment Rehabilitation  PERTINENT HISTORY: Anxiety, depression, hx of L1 balloon kyphoplasty in 2014, HTN, DMII, kidney disease  PRECAUTIONS: None  SUBJECTIVE: Pt reports 8-9/10 Rt lateral hip pain today, which she attributes to sleeping on her Rt hip last night. This pain becomes sharp when walking, but is dull and achy at rest.  PAIN:  Are you having pain? Yes: NPRS scale: 5/10 Pain location: Rt lateral hip Pain description: achy, sharp Aggravating factors: walking/ standing, laying on either side, getting into/ out of  her car, getting out of bed in the morning Relieving factors: rest, heat   OBJECTIVE: (objective measures completed at initial evaluation unless otherwise dated)   DIAGNOSTIC FINDINGS: 12/20/2020: DG Hips Bilat with or without Pelvis 3-4 Views: IMPRESSION: Negative.   PATIENT SURVEYS:  FOTO 36%, predicted 51% in 13 visits 11/21/2021: 43%   COGNITION:           Overall cognitive status: Within functional limits for tasks assessed                          SENSATION: Not tested     MUSCLE LENGTH: Ober's test: (+) BIL Thomas test: (+)   POSTURE: weight shift left   PALPATION: TTP to Lt/ Rt greater trochanter, Rt glute med   LOWER EXTREMITY  ROM:   A/PROM Right eval Left eval  Hip flexion 95/110p! 90/110p!  Hip abduction 22/30p! 28/32p!  Hip adduction 17p! 15p!  Hip internal rotation 20/32p! 32/38p!  Hip external rotation 30/42 28/36   (Blank rows = not tested)   LOWER EXTREMITY MMT:   MMT Right eval Left eval Right 11/21/2021 Left 11/21/2021  Hip flexion 4/5 4/5 4/5 4+/5  Hip extension 3/5 3/5 4/5 4/5  Hip abduction 3/5p! 3/5p! 4+/5 3+/5p!  Hip internal rotation 5/5 4+/5p! 5/5 5/5  Hip external rotation 5/5 5/5 5/5 5/5  Knee flexion 5/5 5/5    Knee extension 5/5 5/5     (Blank rows = not tested)   LOWER EXTREMITY SPECIAL TESTS:  FABER: (+) on Rt FADDIR: (-) Trendelenburg sign: (+) BIL, Lt>Rt   FUNCTIONAL TESTS:  5xSTS: 38 seconds with UE support and increased BIL knee and Lt hip pain Squat: 50%, BIL knee crepitus throughout, pain in Lt hip SLS: Unable to perform without UE support  11/21/2021: 5xSTS: 21 seconds   GAIT: Distance walked: 84f Assistive device utilized: None Level of assistance: Complete Independence Comments: Rt antalgic gait with off-loading to Lt and decreased stance on Rt       TODAY'S TREATMENT:  [facility] Adult PT Treatment:                                                DATE: 12/09/2021 Therapeutic Exercise: Lt sidelying Rt IT band stretch with leg off side of table and MHP to Rt hip x5 minutes Seated BIL hip IR with YTB around ankles 3x10 Standing hip abduction with YTB 2x10 with 3-sec hold and UE support BIL Standing hip extension with YTB 2x10 with 3-sec hold and UE support BIL Squat with UE support 3x8 Supine Rt modified Thomas stretch x2765m Manual Therapy: N/A Neuromuscular re-ed: N/A Therapeutic Activity: N/A Modalities: N/A Self Care: N/A  [facility] Adult PT Treatment:                                                DATE: 12/07/2021 Therapeutic Exercise: nu-step L5 65m71mile taking subjective and planning session with patient Hooklying LTR 2x10 Supine clam alternating -  Blue TB - 3x10 Bridge - 3x10 - with blue TB for ER cue Hip adduction squeeze with pilates ring - 3'' hold 2x10 HL march with blue TB around knees STS from raised seat - 2x6 -  2 airex  Neuromuscular re-ed: Tandem stance - 55'' bouts Rocker board DF/PF (blue)  Manual Therapy LAD R hip   [facility] Adult PT Treatment:                                                DATE: 11/24/2021 Aquatic therapy at [address] - therapeutic pool temp 92 degrees Pt enters building ambulating independently.  Treatment took place in water 3.8 to  4 ft 8 in.feet deep depending upon activity.  Pt entered and exited the pool via stair and handrails independently, but with decreased speed.  Pt pain level 9.5/10 at initiation of water walking.  Therapeutic Exercise: Walking forward/backwards/side stepping Runners stretch on bottom step x30" BIL Forward marching walk x1 lap holding yellow noodle At edge of pool, pt performed LE exercise: Hip abd/add x20 BIL Hip ext/flex with knee straight x 20 BIL Marching hip flexion to knee extension 2x10 BIL Squats 2x20 (small range) Heel/toe raises x20 Hip flexion to 90 with ER/IR x10 each  Pt requires the buoyancy of water for active assisted exercises with buoyancy supported for strengthening and AROM exercises. Hydrostatic pressure also supports joints by unweighting joint load by at least 50 % in 3-4 feet depth water. 80% in chest to neck deep water. Water will provide assistance with movement using the current and laminar flow while the buoyancy reduces weight bearing. Pt requires the viscosity of the water for resistance with strengthening exercises.    PATIENT EDUCATION:  Education details: Pt educated on probable underlying pathophysiology behind her pain presentation, POC, prognosis, FOTO, and HEP Person educated: Patient Education method: Explanation, Demonstration, and Handouts Education comprehension: verbalized understanding and returned  demonstration     HOME EXERCISE PROGRAM: Access Code: [email or web address] URL: [email or web address] Date: 10/17/2021 Prepared by: Vanessa [place]   Exercises - Standing ITB Stretch  - 1 x daily - 7 x weekly - 2-min hold - Seated Piriformis Stretch  - 1 x daily - 7 x weekly - 2-min hold - Hip Flexor Stretch at Edge of Bed  - 1 x daily - 7 x weekly - 2-min hold - Supine Bridge  - 1 x daily - 7 x weekly - 3 sets - 10 reps - 3-sec hold - Sidelying Hip Abduction  - 1 x daily - 7 x weekly - 3 sets - 10 reps - 3-sec hold   ASSESSMENT:   CLINICAL IMPRESSION: Pt responded well to all interventions today, demonstrating good form and no increase in pain with new exercises. She reports a therapeutic response to IT band stretching and MHP combination, reporting a decrease in pain from 8-9/10 to 6/10. She will continue to benefit from skilled PT to address her primary impairments and return to her prior level of function with less limitation.   OBJECTIVE IMPAIRMENTS Abnormal gait, decreased activity tolerance, decreased balance, decreased endurance, decreased mobility, difficulty walking, decreased ROM, decreased strength, hypomobility, increased edema, impaired flexibility, improper body mechanics, postural dysfunction, and pain.    ACTIVITY LIMITATIONS carrying, lifting, bending, sitting, standing, squatting, sleeping, stairs, transfers, bed mobility, dressing, and locomotion level   PARTICIPATION LIMITATIONS: meal prep, cleaning, laundry, interpersonal relationship, driving, shopping, community activity, and yard work   PERSONAL FACTORS Past/current experiences, Time since onset of injury/illness/exacerbation, and 3+ comorbidities: See medical hx  are also affecting patient's functional outcome.  GOALS: Goals reviewed with patient? Yes   SHORT TERM GOALS: Target date: 11/14/2021    Pt will report understanding and adherence to initial HEP in order to promote independence in the  management of primary impairments. Baseline: HEP provided at eval Patient reports adherence 11/14/21 Goal status: MET     LONG TERM GOALS: Target date: 12/13/2021    Pt will achieve a FOTO score of 51% in order to demonstrate improved functional ability as it relates to her primary impairments. Baseline: 36% 11/21/2021: 43% Goal status: PROGRESSING   2.  Pt will achieve a 5xSTS without use of UE in 20 seconds or less in order to promote safe transfers. Baseline: 38 seconds with UE support 11/21/2021: 21 seconds Goal status: PROGRESSING   3.  Pt will achieve 4+/5 global BIL hip strength in order to progress her exercise regimen with less limitation. Baseline: See MMT chart 11/21/2021: See updated MMT chart Goal status: PROGRESSING   4.  Pt will report ability to walk/ stand >10 minutes with 0-3/10 pain in order to grocery shop with less limitation. Baseline: Unable to walk/ stand any amount of time (>6/10 pain) 11/21/2021: Pt reports walking/ standing roughly 10 minutes with >6/10 pain Goal status: PROGRESSING       PLAN: PT FREQUENCY: 2x/week   PT DURATION: 8 weeks   PLANNED INTERVENTIONS: Therapeutic exercises, Therapeutic activity, Neuromuscular re-education, Balance training, Gait training, Patient/Family education, Joint manipulation, Joint mobilization, Stair training, DME instructions, Aquatic Therapy, Dry Needling, Electrical stimulation, Spinal mobilization, Cryotherapy, Moist heat, Taping, Vasopneumatic device, Biofeedback, Ionotophoresis 41m/ml Dexamethasone, Manual therapy, and Re-evaluation   PLAN FOR NEXT SESSION: Progress hip stretching/ strengthening, perform brief lumbar assessment as time allows    YVanessa Mitchell PT, DPT 12/09/21 10:26 AM

## 2021-12-13 ENCOUNTER — Ambulatory Visit: Payer: Medicare PPO | Attending: Internal Medicine

## 2021-12-13 DIAGNOSIS — M25552 Pain in left hip: Secondary | ICD-10-CM | POA: Insufficient documentation

## 2021-12-13 DIAGNOSIS — R262 Difficulty in walking, not elsewhere classified: Secondary | ICD-10-CM | POA: Diagnosis not present

## 2021-12-13 DIAGNOSIS — M6281 Muscle weakness (generalized): Secondary | ICD-10-CM | POA: Insufficient documentation

## 2021-12-13 DIAGNOSIS — M25551 Pain in right hip: Secondary | ICD-10-CM | POA: Insufficient documentation

## 2021-12-13 NOTE — Therapy (Signed)
Patient Name: Ariel Meyer MRN: [id number] DOB:[birth year], 71 y.o., female Today's Date: 11/21/2021   PCP: Leeroy Cha, MD REFERRING PROVIDER: Leeroy Cha, MD  END OF SESSION:   PT End of Session - 12/13/21 1315     Visit Number 12    Number of Visits 20    Date for PT Re-Evaluation 01/17/22    Authorization Type [employer] MCR - approved 12 visits 7/22-9/2    Authorization Time Period FOTO v6, v10, kx mod v15    Authorization - Visit Number 4    Authorization - Number of Visits 12    Progress Note Due on Visit 3    PT Start Time 1302    PT Stop Time 1342    PT Time Calculation (min) 40 min    Activity Tolerance Patient tolerated treatment well;Patient limited by pain    Behavior During Therapy WFL for tasks assessed/performed                     Past Medical History:  Diagnosis Date   Anxiety    Arthritis    Back problem    Chronic kidney disease    Complication of anesthesia    wakes up durning surgery   Depression    Diabetes mellitus without complication ([place])    Dizziness    GERD (gastroesophageal reflux disease)    Hyperlipidemia    Hypertension    Pneumonia    Seasonal allergies    Shortness of breath dyspnea    Sleep apnea    Past Surgical History:  Procedure Laterality Date   BACK SURGERY     KYPHOPLASTY N/A 08/12/2012   Procedure: KYPHOPLASTY;  Surgeon: Otilio Connors, MD;  Location: [facility];  Service: Neurosurgery;  Laterality: N/A;  L1 Balloon Kyphoplasty   NASAL SINUS SURGERY     TONSILLECTOMY     TUBAL LIGATION     Patient Active Problem List   Diagnosis Date Noted   Upper airway cough syndrome 06/21/2015   Acute encephalopathy 05/10/2014   Alcohol withdrawal delirium ([place]) 05/10/2014   Difficult intubation    Acute respiratory failure with hypoxia ([place])    Angioedema 05/06/2014   Goiter 05/06/2014   Hypertension 05/06/2014   Acute sinusitis 02/10/2014   Nausea with vomiting  02/10/2014   DM (diabetes mellitus), type 2 with renal complications ([place]) 63/94/3200   Renal insufficiency 02/07/2014   Acute renal failure ([place]) 02/07/2014   Enteritis due to Clostridium difficile 02/07/2014   Hypotension 02/07/2014   Benign hypertension 02/07/2014   Obesity, morbid ([place]) 02/07/2014   GERD (gastroesophageal reflux disease) 02/07/2014   Hyperlipidemia 02/07/2014   Sciatica 06/10/2012   Lumbar compression fracture ([place]) 06/10/2012    REFERRING DIAG: M25.552 (ICD-10-CM) - Pain in left hip   THERAPY DIAG:  Pain in right hip - Plan: PT plan of care cert/re-cert  Pain in left hip - Plan: PT plan of care cert/re-cert  Difficulty in walking, not elsewhere classified - Plan: PT plan of care cert/re-cert  Muscle weakness (generalized) - Plan: PT plan of care cert/re-cert  Rationale for Evaluation and Treatment Rehabilitation  PERTINENT HISTORY: Anxiety, depression, hx of L1 balloon kyphoplasty in 2014, HTN, DMII, kidney disease  PRECAUTIONS: None  SUBJECTIVE: Pt reports she had very high levels of pain this morning and had a near fall while getting out of bed, although she denies any acute injury from this incident.   PAIN:  Are you having  pain? Yes: NPRS scale: 5/10 Pain location: Rt lateral hip Pain description: achy, sharp Aggravating factors: walking/ standing, laying on either side, getting into/ out of her car, getting out of bed in the morning Relieving factors: rest, heat   OBJECTIVE: (objective measures completed at initial evaluation unless otherwise dated)   DIAGNOSTIC FINDINGS: 12/20/2020: DG Hips Bilat with or without Pelvis 3-4 Views: IMPRESSION: Negative.   PATIENT SURVEYS:  FOTO 36%, predicted 51% in 13 visits 11/21/2021: 43% 12/13/2021: 36%   COGNITION:           Overall cognitive status: Within functional limits for tasks assessed                          SENSATION: Not tested     MUSCLE LENGTH: Ober's test: (+) BIL Thomas test: (+)    POSTURE: weight shift left   PALPATION: TTP to Lt/ Rt greater trochanter, Rt glute med   LOWER EXTREMITY ROM:   A/PROM Right eval Left eval  Hip flexion 95/110p! 90/110p!  Hip abduction 22/30p! 28/32p!  Hip adduction 17p! 15p!  Hip internal rotation 20/32p! 32/38p!  Hip external rotation 30/42 28/36   (Blank rows = not tested)   LOWER EXTREMITY MMT:   MMT Right eval Left eval Right 11/21/2021 Left 11/21/2021  Hip flexion 4/5 4/5 4/5 4+/5  Hip extension 3/5 3/5 4/5 4/5  Hip abduction 3/5p! 3/5p! 4+/5 3+/5p!  Hip internal rotation 5/5 4+/5p! 5/5 5/5  Hip external rotation 5/5 5/5 5/5 5/5  Knee flexion 5/5 5/5    Knee extension 5/5 5/5     (Blank rows = not tested)   LOWER EXTREMITY SPECIAL TESTS:  FABER: (+) on Rt FADDIR: (-) Trendelenburg sign: (+) BIL, Lt>Rt   FUNCTIONAL TESTS:  5xSTS: 38 seconds with UE support and increased BIL knee and Lt hip pain Squat: 50%, BIL knee crepitus throughout, pain in Lt hip SLS: Unable to perform without UE support  11/21/2021: 5xSTS: 21 seconds   GAIT: Distance walked: 33f Assistive device utilized: None Level of assistance: Complete Independence Comments: Rt antalgic gait with off-loading to Lt and decreased stance on Rt       TODAY'S TREATMENT:  [facility] Adult PT Treatment:                                                DATE: 12/13/2021 Therapeutic Exercise: NuStep x5 minutes while collecting subjective information Self Rt hip distraction with anchored sheet into rocking lunge x252m Bridge with 10# plate 3x10 with 3-sec hold Hooklyign LTR 2x10 Manual Therapy: Hooklying lateral Rt hip distraction with mobilization belt with rocking x5m3mRt hip long-axis distraction [id number] re-ed: N/A Therapeutic Activity: Re-administration of FOTO with pt education Update to POC with pt education Modalities: N/A Self Care: N/A   [facility] Adult PT Treatment:                                                DATE:  12/09/2021 Therapeutic Exercise: Lt sidelying Rt IT band stretch with leg off side of table and MHP to Rt hip x5 minutes Seated BIL hip IR with YTB around ankles 3x10 Standing hip abduction with YTB 2x10 with 3-sec hold  and UE support BIL Standing hip extension with YTB 2x10 with 3-sec hold and UE support BIL Squat with UE support 3x8 Supine Rt modified Thomas stretch x49mn Manual Therapy: N/A Neuromuscular re-ed: N/A Therapeutic Activity: N/A Modalities: N/A Self Care: N/A  [facility] Adult PT Treatment:                                                DATE: 12/07/2021 Therapeutic Exercise: nu-step L5 536mhile taking subjective and planning session with patient Hooklying LTR 2x10 Supine clam alternating - Blue TB - 3x10 Bridge - 3x10 - with blue TB for ER cue Hip adduction squeeze with pilates ring - 3'' hold 2x10 HL march with blue TB around knees STS from raised seat - 2x6 - 2 airex  Neuromuscular re-ed: Tandem stance - 4558 bouts Rocker board DF/PF (blue)  Manual Therapy LAD R hip      PATIENT EDUCATION:  Education details: Pt educated on probable underlying pathophysiology behind her pain presentation, POC, prognosis, FOTO, and HEP Person educated: Patient Education method: Explanation, Demonstration, and Handouts Education comprehension: verbalized understanding and returned demonstration     HOME EXERCISE PROGRAM: Access Code: [email or web address] URL: [email or web address] Date: 10/17/2021 Prepared by: TuVanessa [place] Exercises - Standing ITB Stretch  - 1 x daily - 7 x weekly - 2-min hold - Seated Piriformis Stretch  - 1 x daily - 7 x weekly - 2-min hold - Hip Flexor Stretch at Edge of Bed  - 1 x daily - 7 x weekly - 2-min hold - Supine Bridge  - 1 x daily - 7 x weekly - 3 sets - 10 reps - 3-sec hold - Sidelying Hip Abduction  - 1 x daily - 7 x weekly - 3 sets - 10 reps - 3-sec hold   ASSESSMENT:   CLINICAL IMPRESSION: Pt responded well to all  interventions today, demonstrating good form and no increase in pain with completed exercises. Of note, she reports a very therapeutic response to hip distraction techniques today, reporting complete remission of pain during and after treatment. To date, the pt has made good progress in her functional walking ability, 5xSTS, and overall LE strength. She will continue to benefit from skilled PT to address her primary impairments and return to her prior level of function with less limitation.   OBJECTIVE IMPAIRMENTS Abnormal gait, decreased activity tolerance, decreased balance, decreased endurance, decreased mobility, difficulty walking, decreased ROM, decreased strength, hypomobility, increased edema, impaired flexibility, improper body mechanics, postural dysfunction, and pain.    ACTIVITY LIMITATIONS carrying, lifting, bending, sitting, standing, squatting, sleeping, stairs, transfers, bed mobility, dressing, and locomotion level   PARTICIPATION LIMITATIONS: meal prep, cleaning, laundry, interpersonal relationship, driving, shopping, community activity, and yard work   PERSONAL FACTORS Past/current experiences, Time since onset of injury/illness/exacerbation, and 3+ comorbidities: See medical hx  are also affecting patient's functional outcome.        GOALS: Goals reviewed with patient? Yes   SHORT TERM GOALS: Target date: 11/14/2021    Pt will report understanding and adherence to initial HEP in order to promote independence in the management of primary impairments. Baseline: HEP provided at eval Patient reports adherence 11/14/21 Goal status: MET     LONG TERM GOALS: Target date: 12/13/2021    Pt will achieve a FOTO score of 51% in order to demonstrate improved functional ability as  it relates to her primary impairments. Baseline: 36% 11/21/2021: 43% 12/13/2021: 36% Goal status: PROGRESSING   2.  Pt will achieve a 5xSTS without use of UE in 20 seconds or less in order to promote safe  transfers. Baseline: 38 seconds with UE support 11/21/2021: 21 seconds Goal status: PROGRESSING   3.  Pt will achieve 4+/5 global BIL hip strength in order to progress her exercise regimen with less limitation. Baseline: See MMT chart 11/21/2021: See updated MMT chart Goal status: PROGRESSING   4.  Pt will report ability to walk/ stand >10 minutes with 0-3/10 pain in order to grocery shop with less limitation. Baseline: Unable to walk/ stand any amount of time (>6/10 pain) 11/21/2021: Pt reports walking/ standing roughly 10 minutes with >6/10 pain 12/13/2021: 30-45 minutes with a shopping cart with 3/10 pain Goal status: ACHIEVED       PLAN: PT FREQUENCY: 2x/week   PT DURATION: 8 weeks   PLANNED INTERVENTIONS: Therapeutic exercises, Therapeutic activity, Neuromuscular re-education, Balance training, Gait training, Patient/Family education, Joint manipulation, Joint mobilization, Stair training, DME instructions, Aquatic Therapy, Dry Needling, Electrical stimulation, Spinal mobilization, Cryotherapy, Moist heat, Taping, Vasopneumatic device, Biofeedback, Ionotophoresis 55m/ml Dexamethasone, Manual therapy, and Re-evaluation   PLAN FOR NEXT SESSION: Progress hip stretching/ strengthening, perform brief lumbar assessment as time allows  Referring diagnosis? M25.552 (ICD-10-CM) - Pain in left hip Treatment diagnosis? (if different than referring diagnosis) Pain in right hip  Pain in left hip  Difficulty in walking, not elsewhere classified  Muscle weakness (generalized) What was this (referring dx) caused by? []  Surgery []  Fall [x]  Ongoing issue []  Arthritis []  Other: ____________  Laterality: []  Rt []  Lt [x]  Both  Check all possible CPT codes:  *CHOOSE 10 OR LESS*    [x]  97110 (Therapeutic Exercise)  []  92507 (SLP Treatment)  [x]  97112 (Neuro Re-ed)   []  92526 (Swallowing Treatment)   [x]  97116 (Gait Training)   []  [id number](Cognitive Training, 1st 15 minutes) [x]  97140  (Manual Therapy)   []  97130 (Cognitive Training, each add'l 15 minutes)  [x]  97164 (Re-evaluation)                              []  Other, List CPT Code ____________  [x]  986484(Therapeutic Activities)     [x]  972072(Self Care)   []  All codes above (97110 - 97535)  [x]  97012 (Mechanical Traction)  [x]  97014 (E-stim Unattended)  [x]  97032 (E-stim manual)  []  97033 (Ionto)  []  97035 (Ultrasound) []  97750 (Physical Performance Training) [x]  [id number](Aquatic Therapy) []  97016 (Vasopneumatic Device) []  [id number](Paraffin) []  97034 (Contrast Bath) []  97597 (Wound Care 1st 20 sq cm) []  97598 (Wound Care each add'l 20 sq cm) []  97760 (Orthotic Fabrication, Fitting, Training Initial) []  [id number](Prosthetic Management and Training Initial) []  [id number](Orthotic or Prosthetic Training/ Modification Subsequent)   YVanessa Modena PT, DPT 12/13/21 1:42 PM

## 2021-12-23 ENCOUNTER — Encounter: Payer: Self-pay | Admitting: Physical Therapy

## 2021-12-23 ENCOUNTER — Ambulatory Visit: Payer: Medicare PPO | Admitting: Physical Therapy

## 2021-12-23 DIAGNOSIS — R262 Difficulty in walking, not elsewhere classified: Secondary | ICD-10-CM | POA: Diagnosis not present

## 2021-12-23 DIAGNOSIS — M25552 Pain in left hip: Secondary | ICD-10-CM | POA: Diagnosis not present

## 2021-12-23 DIAGNOSIS — M25551 Pain in right hip: Secondary | ICD-10-CM

## 2021-12-23 DIAGNOSIS — M6281 Muscle weakness (generalized): Secondary | ICD-10-CM

## 2021-12-23 NOTE — Therapy (Signed)
Patient Name: Ariel Meyer MRN: [id number] DOB:18-Mar-1951, 71 y.o., female Today's Date: 11/21/2021   PCP: Leeroy Cha, MD REFERRING PROVIDER: Leeroy Cha, MD  END OF SESSION:   PT End of Session - 12/23/21 1034     Visit Number 13    Number of Visits 20    Date for PT Re-Evaluation 01/17/22    Authorization Type [employer] MCR - approved 12 visits 7/22-9/2    Authorization Time Period FOTO v6, v10, kx mod v15    Authorization - Visit Number --    Authorization - Number of Visits --    Progress Note Due on Visit 20    PT Start Time 1033    PT Stop Time 1113    PT Time Calculation (min) 40 min    Activity Tolerance Patient tolerated treatment well;Patient limited by pain    Behavior During Therapy WFL for tasks assessed/performed                     Past Medical History:  Diagnosis Date   Anxiety    Arthritis    Back problem    Chronic kidney disease    Complication of anesthesia    wakes up durning surgery   Depression    Diabetes mellitus without complication ([place])    Dizziness    GERD (gastroesophageal reflux disease)    Hyperlipidemia    Hypertension    Pneumonia    Seasonal allergies    Shortness of breath dyspnea    Sleep apnea    Past Surgical History:  Procedure Laterality Date   BACK SURGERY     KYPHOPLASTY N/A 08/12/2012   Procedure: KYPHOPLASTY;  Surgeon: Otilio Connors, MD;  Location: [facility];  Service: Neurosurgery;  Laterality: N/A;  L1 Balloon Kyphoplasty   NASAL SINUS SURGERY     TONSILLECTOMY     TUBAL LIGATION     Patient Active Problem List   Diagnosis Date Noted   Upper airway cough syndrome 06/21/2015   Acute encephalopathy 05/10/2014   Alcohol withdrawal delirium ([place]) 05/10/2014   Difficult intubation    Acute respiratory failure with hypoxia ([place])    Angioedema 05/06/2014   Goiter 05/06/2014   Hypertension 05/06/2014   Acute sinusitis 02/10/2014   Nausea with vomiting  02/10/2014   DM (diabetes mellitus), type 2 with renal complications ([place]) 67/70/3403   Renal insufficiency 02/07/2014   Acute renal failure ([place]) 02/07/2014   Enteritis due to Clostridium difficile 02/07/2014   Hypotension 02/07/2014   Benign hypertension 02/07/2014   Obesity, morbid ([place]) 02/07/2014   GERD (gastroesophageal reflux disease) 02/07/2014   Hyperlipidemia 02/07/2014   Sciatica 06/10/2012   Lumbar compression fracture ([place]) 06/10/2012    REFERRING DIAG: M25.552 (ICD-10-CM) - Pain in left hip   THERAPY DIAG:  Pain in right hip  Pain in left hip  Difficulty in walking, not elsewhere classified  Muscle weakness (generalized)  Rationale for Evaluation and Treatment Rehabilitation  PERTINENT HISTORY: Anxiety, depression, hx of L1 balloon kyphoplasty in 2014, HTN, DMII, kidney disease  PRECAUTIONS: None  SUBJECTIVE: Pt reports she had very high levels of pain this morning and had a near fall while getting out of bed, although she denies any acute injury from this incident.   PAIN:  Are you having pain? Yes: NPRS scale: 5/10 Pain location: Rt lateral hip Pain description: achy, sharp Aggravating factors: walking/ standing, laying on either side, getting into/ out of her car,  getting out of bed in the morning Relieving factors: rest, heat   OBJECTIVE: (objective measures completed at initial evaluation unless otherwise dated)   DIAGNOSTIC FINDINGS: 12/20/2020: DG Hips Bilat with or without Pelvis 3-4 Views: IMPRESSION: Negative.   PATIENT SURVEYS:  FOTO 36%, predicted 51% in 13 visits 11/21/2021: 43% 12/13/2021: 36%   COGNITION:           Overall cognitive status: Within functional limits for tasks assessed                          SENSATION: Not tested     MUSCLE LENGTH: Ober's test: (+) BIL Thomas test: (+)   POSTURE: weight shift left   PALPATION: TTP to Lt/ Rt greater trochanter, Rt glute med   LOWER EXTREMITY ROM:   A/PROM Right eval  Left eval  Hip flexion 95/110p! 90/110p!  Hip abduction 22/30p! 28/32p!  Hip adduction 17p! 15p!  Hip internal rotation 20/32p! 32/38p!  Hip external rotation 30/42 28/36   (Blank rows = not tested)   LOWER EXTREMITY MMT:   MMT Right eval Left eval Right 11/21/2021 Left 11/21/2021  Hip flexion 4/5 4/5 4/5 4+/5  Hip extension 3/5 3/5 4/5 4/5  Hip abduction 3/5p! 3/5p! 4+/5 3+/5p!  Hip internal rotation 5/5 4+/5p! 5/5 5/5  Hip external rotation 5/5 5/5 5/5 5/5  Knee flexion 5/5 5/5    Knee extension 5/5 5/5     (Blank rows = not tested)   LOWER EXTREMITY SPECIAL TESTS:  FABER: (+) on Rt FADDIR: (-) Trendelenburg sign: (+) BIL, Lt>Rt   FUNCTIONAL TESTS:  5xSTS: 38 seconds with UE support and increased BIL knee and Lt hip pain Squat: 50%, BIL knee crepitus throughout, pain in Lt hip SLS: Unable to perform without UE support  11/21/2021: 5xSTS: 21 seconds   GAIT: Distance walked: 35f Assistive device utilized: None Level of assistance: Complete Independence Comments: Rt antalgic gait with off-loading to Lt and decreased stance on Rt       TODAY'S TREATMENT:   [facility] Adult PT Treatment:                                                DATE: 12/23/2021 Therapeutic Exercise: NuStep x5 minutes while collecting subjective information Bridge with 10# plate 3x10 with 3-sec hold HL LTR 2x10 Alternating HL march - 2x10 ea Alternating HL clam - RTB Hip adduction ball squeeze - 3x10  Manual Therapy: Rt hip long-axis distraction  [facility] Adult PT Treatment:                                                DATE: 12/13/2021 Therapeutic Exercise: NuStep x5 minutes while collecting subjective information Self Rt hip distraction with anchored sheet into rocking lunge x210m Bridge with 10# plate 3x10 with 3-sec hold Hooklyign LTR 2x10 Manual Therapy: Hooklying lateral Rt hip distraction with mobilization belt with rocking x5m3mRt hip long-axis distraction x3mi53meuromuscular  re-ed: N/A Therapeutic Activity: Re-administration of FOTO with pt education Update to POC with pt education Modalities: N/A Self Care: N/A   [facility] Adult PT Treatment:  DATE: 12/09/2021 Therapeutic Exercise: Lt sidelying Rt IT band stretch with leg off side of table and MHP to Rt hip x5 minutes Seated BIL hip IR with YTB around ankles 3x10 Standing hip abduction with YTB 2x10 with 3-sec hold and UE support BIL Standing hip extension with YTB 2x10 with 3-sec hold and UE support BIL Squat with UE support 3x8 Supine Rt modified Thomas stretch x108mn Manual Therapy: N/A Neuromuscular re-ed: N/A Therapeutic Activity: N/A Modalities: N/A Self Care: N/A  [facility] Adult PT Treatment:                                                DATE: 12/07/2021 Therapeutic Exercise: nu-step L5 566mhile taking subjective and planning session with patient Hooklying LTR 2x10 Supine clam alternating - Blue TB - 3x10 Bridge - 3x10 - with blue TB for ER cue Hip adduction squeeze with pilates ring - 3'' hold 2x10 HL march with blue TB around knees STS from raised seat - 2x6 - 2 airex  Neuromuscular re-ed: Tandem stance - 4528 bouts Rocker board DF/PF (blue)  Manual Therapy LAD R hip      PATIENT EDUCATION:  Education details: Pt educated on probable underlying pathophysiology behind her pain presentation, POC, prognosis, FOTO, and HEP Person educated: Patient Education method: Explanation, Demonstration, and Handouts Education comprehension: verbalized understanding and returned demonstration     HOME EXERCISE PROGRAM: Access Code: [email or web address] URL: [email or web address] Date: 10/17/2021 Prepared by: TuVanessa [place] Exercises - Standing ITB Stretch  - 1 x daily - 7 x weekly - 2-min hold - Seated Piriformis Stretch  - 1 x daily - 7 x weekly - 2-min hold - Hip Flexor Stretch at Edge of Bed  - 1 x daily - 7 x weekly - 2-min hold -  Supine Bridge  - 1 x daily - 7 x weekly - 3 sets - 10 reps - 3-sec hold - Sidelying Hip Abduction  - 1 x daily - 7 x weekly - 3 sets - 10 reps - 3-sec hold   ASSESSMENT:   CLINICAL IMPRESSION: [place] tolerated session well with no adverse reaction.  She continues to responds well with LAD, but the pain relief is not durable.  We continue to concentrate on basic strengthening in non WB positions.   OBJECTIVE IMPAIRMENTS Abnormal gait, decreased activity tolerance, decreased balance, decreased endurance, decreased mobility, difficulty walking, decreased ROM, decreased strength, hypomobility, increased edema, impaired flexibility, improper body mechanics, postural dysfunction, and pain.    ACTIVITY LIMITATIONS carrying, lifting, bending, sitting, standing, squatting, sleeping, stairs, transfers, bed mobility, dressing, and locomotion level   PARTICIPATION LIMITATIONS: meal prep, cleaning, laundry, interpersonal relationship, driving, shopping, community activity, and yard work   PERSONAL FACTORS Past/current experiences, Time since onset of injury/illness/exacerbation, and 3+ comorbidities: See medical hx  are also affecting patient's functional outcome.        GOALS: Goals reviewed with patient? Yes   SHORT TERM GOALS: Target date: 11/14/2021    Pt will report understanding and adherence to initial HEP in order to promote independence in the management of primary impairments. Baseline: HEP provided at eval Patient reports adherence 11/14/21 Goal status: MET     LONG TERM GOALS: Target date: 12/13/2021    Pt will achieve a FOTO score of 51% in order to demonstrate improved functional ability as it relates to her primary impairments.  Baseline: 36% 11/21/2021: 43% 12/13/2021: 36% Goal status: PROGRESSING   2.  Pt will achieve a 5xSTS without use of UE in 20 seconds or less in order to promote safe transfers. Baseline: 38 seconds with UE support 11/21/2021: 21 seconds Goal status:  PROGRESSING   3.  Pt will achieve 4+/5 global BIL hip strength in order to progress her exercise regimen with less limitation. Baseline: See MMT chart 11/21/2021: See updated MMT chart Goal status: PROGRESSING   4.  Pt will report ability to walk/ stand >10 minutes with 0-3/10 pain in order to grocery shop with less limitation. Baseline: Unable to walk/ stand any amount of time (>6/10 pain) 11/21/2021: Pt reports walking/ standing roughly 10 minutes with >6/10 pain 12/13/2021: 30-45 minutes with a shopping cart with 3/10 pain Goal status: ACHIEVED       PLAN: PT FREQUENCY: 2x/week   PT DURATION: 8 weeks   PLANNED INTERVENTIONS: Therapeutic exercises, Therapeutic activity, Neuromuscular re-education, Balance training, Gait training, Patient/Family education, Joint manipulation, Joint mobilization, Stair training, DME instructions, Aquatic Therapy, Dry Needling, Electrical stimulation, Spinal mobilization, Cryotherapy, Moist heat, Taping, Vasopneumatic device, Biofeedback, Ionotophoresis 36m/ml Dexamethasone, Manual therapy, and Re-evaluation   PLAN FOR NEXT SESSION: Progress hip stretching/ strengthening, perform brief lumbar assessment as time allows    KKevan NyReinhartsen PT 12/23/21 11:13 AM

## 2021-12-26 ENCOUNTER — Ambulatory Visit: Payer: Medicare PPO | Admitting: Physical Therapy

## 2021-12-26 ENCOUNTER — Encounter: Payer: Self-pay | Admitting: Physical Therapy

## 2021-12-26 DIAGNOSIS — M6281 Muscle weakness (generalized): Secondary | ICD-10-CM

## 2021-12-26 DIAGNOSIS — M25552 Pain in left hip: Secondary | ICD-10-CM | POA: Diagnosis not present

## 2021-12-26 DIAGNOSIS — M25551 Pain in right hip: Secondary | ICD-10-CM | POA: Diagnosis not present

## 2021-12-26 DIAGNOSIS — R262 Difficulty in walking, not elsewhere classified: Secondary | ICD-10-CM

## 2021-12-26 NOTE — Therapy (Signed)
PHYSICAL THERAPY DISCHARGE SUMMARY  Visits from Start of Care: 14  Current functional level related to goals / functional outcomes: See assessment/goals   Remaining deficits: See assessment/goals   Education / Equipment: HEP and D/C plans  Patient agrees to discharge. Patient goals were partially met. Patient is being discharged due to lack of progress.   Patient Name: Ariel Meyer MRN: [id number] DOB:1950-05-24, 71 y.o., female Today's Date: 11/21/2021   PCP: Leeroy Cha, MD REFERRING PROVIDER: Leeroy Cha, MD  END OF SESSION:   PT End of Session - 12/26/21 1139     Visit Number 14    Number of Visits 20    Date for PT Re-Evaluation 01/17/22    Authorization Type [employer] MCR - approved 12 visits 7/22-9/2    Authorization Time Period FOTO v6, v10, kx mod v15    Progress Note Due on Visit 63    PT Start Time 1137    PT Stop Time 1205    PT Time Calculation (min) 28 min    Activity Tolerance Patient tolerated treatment well;Patient limited by pain    Behavior During Therapy WFL for tasks assessed/performed                     Past Medical History:  Diagnosis Date   Anxiety    Arthritis    Back problem    Chronic kidney disease    Complication of anesthesia    wakes up durning surgery   Depression    Diabetes mellitus without complication ([place])    Dizziness    GERD (gastroesophageal reflux disease)    Hyperlipidemia    Hypertension    Pneumonia    Seasonal allergies    Shortness of breath dyspnea    Sleep apnea    Past Surgical History:  Procedure Laterality Date   BACK SURGERY     KYPHOPLASTY N/A 08/12/2012   Procedure: KYPHOPLASTY;  Surgeon: Otilio Connors, MD;  Location: [facility];  Service: Neurosurgery;  Laterality: N/A;  L1 Balloon Kyphoplasty   NASAL SINUS SURGERY     TONSILLECTOMY     TUBAL LIGATION     Patient Active Problem List   Diagnosis Date Noted   Upper airway cough syndrome 06/21/2015    Acute encephalopathy 05/10/2014   Alcohol withdrawal delirium ([place]) 05/10/2014   Difficult intubation    Acute respiratory failure with hypoxia ([place])    Angioedema 05/06/2014   Goiter 05/06/2014   Hypertension 05/06/2014   Acute sinusitis 02/10/2014   Nausea with vomiting 02/10/2014   DM (diabetes mellitus), type 2 with renal complications ([place]) 30/16/0109   Renal insufficiency 02/07/2014   Acute renal failure ([place]) 02/07/2014   Enteritis due to Clostridium difficile 02/07/2014   Hypotension 02/07/2014   Benign hypertension 02/07/2014   Obesity, morbid ([place]) 02/07/2014   GERD (gastroesophageal reflux disease) 02/07/2014   Hyperlipidemia 02/07/2014   Sciatica 06/10/2012   Lumbar compression fracture ([place]) 06/10/2012    REFERRING DIAG: M25.552 (ICD-10-CM) - Pain in left hip   THERAPY DIAG:  Pain in right hip  Pain in left hip  Difficulty in walking, not elsewhere classified  Muscle weakness (generalized)  Rationale for Evaluation and Treatment Rehabilitation  PERTINENT HISTORY: Anxiety, depression, hx of L1 balloon kyphoplasty in 2014, HTN, DMII, kidney disease  PRECAUTIONS: None  SUBJECTIVE: Pt reports that her pain is no longer improving and she would like to be D/C'd.  She saw innitial improvement but now feels she has  reached a plateau.   PAIN:  Are you having pain? Yes: NPRS scale: 5/10 Pain location: Rt lateral hip Pain description: achy, sharp Aggravating factors: walking/ standing, laying on either side, getting into/ out of her car, getting out of bed in the morning Relieving factors: rest, heat   OBJECTIVE: (objective measures completed at initial evaluation unless otherwise dated)   DIAGNOSTIC FINDINGS: 12/20/2020: DG Hips Bilat with or without Pelvis 3-4 Views: IMPRESSION: Negative.   PATIENT SURVEYS:  FOTO 36%, predicted 51% in 13 visits 11/21/2021: 43% 12/13/2021: 36%   COGNITION:           Overall cognitive status: Within functional limits for  tasks assessed                          SENSATION: Not tested     MUSCLE LENGTH: Ober's test: (+) BIL Thomas test: (+)   POSTURE: weight shift left   PALPATION: TTP to Lt/ Rt greater trochanter, Rt glute med   LOWER EXTREMITY ROM:   A/PROM Right eval Left eval  Hip flexion 95/110p! 90/110p!  Hip abduction 22/30p! 28/32p!  Hip adduction 17p! 15p!  Hip internal rotation 20/32p! 32/38p!  Hip external rotation 30/42 28/36   (Blank rows = not tested)   LOWER EXTREMITY MMT:   MMT Right eval Left eval Right 11/21/2021 Left 11/21/2021  Hip flexion 4/5 4/5 4/5 4+/5  Hip extension 3/5 3/5 4/5 4/5  Hip abduction 3/5p! 3/5p! 4+/5 3+/5p!  Hip internal rotation 5/5 4+/5p! 5/5 5/5  Hip external rotation 5/5 5/5 5/5 5/5  Knee flexion 5/5 5/5    Knee extension 5/5 5/5     (Blank rows = not tested)   LOWER EXTREMITY SPECIAL TESTS:  FABER: (+) on Rt FADDIR: (-) Trendelenburg sign: (+) BIL, Lt>Rt   FUNCTIONAL TESTS:  5xSTS: 38 seconds with UE support and increased BIL knee and Lt hip pain Squat: 50%, BIL knee crepitus throughout, pain in Lt hip SLS: Unable to perform without UE support  11/21/2021: 5xSTS: 21 seconds   GAIT: Distance walked: 57f Assistive device utilized: None Level of assistance: Complete Independence Comments: Rt antalgic gait with off-loading to Lt and decreased stance on Rt       TODAY'S TREATMENT:  [facility] Adult PT Treatment:                                                DATE: 12/26/2021 Therapeutic Exercise: NuStep x8 minutes while collecting subjective information  Therapeutic Activity - collecting information for goals, checking progress, and reviewing with patient  Manual Therapy: Rt hip long-axis distraction  [facility] Adult PT Treatment:                                                DATE: 12/23/2021 Therapeutic Exercise: NuStep x5 minutes while collecting subjective information Bridge with 10# plate 3x10 with 3-sec hold HL LTR  2x10 Alternating HL march - 2x10 ea Alternating HL clam - RTB Hip adduction ball squeeze - 3x10  Manual Therapy: Rt hip long-axis distraction  [facility] Adult PT Treatment:  DATE: 12/13/2021 Therapeutic Exercise: NuStep x5 minutes while collecting subjective information Self Rt hip distraction with anchored sheet into rocking lunge x24mn Bridge with 10# plate 3x10 with 3-sec hold Hooklyign LTR 2x10 Manual Therapy: Hooklying lateral Rt hip distraction with mobilization belt with rocking x517m Rt hip long-axis distraction [id number] re-ed: N/A Therapeutic Activity: Re-administration of FOTO with pt education Update to POC with pt education Modalities: N/A Self Care: N/A   [facility] Adult PT Treatment:                                                DATE: 12/09/2021 Therapeutic Exercise: Lt sidelying Rt IT band stretch with leg off side of table and MHP to Rt hip x5 minutes Seated BIL hip IR with YTB around ankles 3x10 Standing hip abduction with YTB 2x10 with 3-sec hold and UE support BIL Standing hip extension with YTB 2x10 with 3-sec hold and UE support BIL Squat with UE support 3x8 Supine Rt modified Thomas stretch [id number] Therapy: N/A Neuromuscular re-ed: N/A Therapeutic Activity: N/A Modalities: N/A Self Care: N/A  [facility] Adult PT Treatment:                                                DATE: 12/07/2021 Therapeutic Exercise: nu-step L5 63m w44me taking subjective and planning session with patient Hooklying LTR 2x10 Supine clam alternating - Blue TB - 3x10 Bridge - 3x10 - with blue TB for ER cue Hip adduction squeeze with pilates ring - 3'' hold 2x10 HL march with blue TB around knees STS from raised seat - 2x6 - 2 airex  Neuromuscular re-ed: Tandem stance - 45'' 46uts Rocker board DF/PF (blue)  Manual Therapy LAD R hip         HOME EXERCISE PROGRAM: Access Code: [email or web address] URL:  [email or web address] Date: 10/17/2021 Prepared by: TuckeVanessa Durhamercises - Standing ITB Stretch  - 1 x daily - 7 x weekly - 2-min hold - Seated Piriformis Stretch  - 1 x daily - 7 x weekly - 2-min hold - Hip Flexor Stretch at Edge of Bed  - 1 x daily - 7 x weekly - 2-min hold - Supine Bridge  - 1 x daily - 7 x weekly - 3 sets - 10 reps - 3-sec hold - Sidelying Hip Abduction  - 1 x daily - 7 x weekly - 3 sets - 10 reps - 3-sec hold   ASSESSMENT:   CLINICAL IMPRESSION: MalonEvert Kohlprogressed fair with therapy.  Improved impairments include: pain, balance, LE strength.  Functional improvements include: transfers, improved ability to ambulate in the community for grocery shopping.  Progressions needed include: continued work at home with HEP.  Barriers to progress include: limited by pain in R hip; recommended surgical consult.  Please see GOALS section for progress on short term and long term goals established at evaluation.  I recommend D/C home with HEP; pt agrees with plan.   OBJECTIVE IMPAIRMENTS Abnormal gait, decreased activity tolerance, decreased balance, decreased endurance, decreased mobility, difficulty walking, decreased ROM, decreased strength, hypomobility, increased edema, impaired flexibility, improper body mechanics, postural dysfunction, and pain.    ACTIVITY LIMITATIONS carrying, lifting, bending, sitting, standing, squatting, sleeping, stairs, transfers, bed  mobility, dressing, and locomotion level   PARTICIPATION LIMITATIONS: meal prep, cleaning, laundry, interpersonal relationship, driving, shopping, community activity, and yard work   PERSONAL FACTORS Past/current experiences, Time since onset of injury/illness/exacerbation, and 3+ comorbidities: See medical hx  are also affecting patient's functional outcome.        GOALS: Goals reviewed with patient? Yes   SHORT TERM GOALS: Target date: 11/14/2021    Pt will report  understanding and adherence to initial HEP in order to promote independence in the management of primary impairments. Baseline: HEP provided at eval Patient reports adherence 11/14/21 Goal status: MET     LONG TERM GOALS: Target date: 12/13/2021    Pt will achieve a FOTO score of 51% in order to demonstrate improved functional ability as it relates to her primary impairments. Baseline: 36% 11/21/2021: 43% 12/13/2021: 36% 8/15: 38% Goal status: PROGRESSING   2.  Pt will achieve a 5xSTS without use of UE in 20 seconds or less in order to promote safe transfers. Baseline: 38 seconds with UE support 11/21/2021: 21 seconds 8/15: 21 sec Goal status: PARTIALLY MET   3.  Pt will achieve 4+/5 global BIL hip strength in order to progress her exercise regimen with less limitation. Baseline: See MMT chart 11/21/2021: See updated MMT chart 8/15 not achieved for hip abd Goal status: Partially met   4.  Pt will report ability to walk/ stand >10 minutes with 0-3/10 pain in order to grocery shop with less limitation. Baseline: Unable to walk/ stand any amount of time (>6/10 pain) 11/21/2021: Pt reports walking/ standing roughly 10 minutes with >6/10 pain 12/13/2021: 30-45 minutes with a shopping cart with 3/10 pain Goal status: ACHIEVED       PLAN: PT FREQUENCY: 2x/week   PT DURATION: 8 weeks   PLANNED INTERVENTIONS: Therapeutic exercises, Therapeutic activity, Neuromuscular re-education, Balance training, Gait training, Patient/Family education, Joint manipulation, Joint mobilization, Stair training, DME instructions, Aquatic Therapy, Dry Needling, Electrical stimulation, Spinal mobilization, Cryotherapy, Moist heat, Taping, Vasopneumatic device, Biofeedback, Ionotophoresis 49m/ml Dexamethasone, Manual therapy, and Re-evaluation   PLAN FOR NEXT SESSION: Progress hip stretching/ strengthening, perform brief lumbar assessment as time allows    KMathis DadPT 12/26/21 12:06 PM

## 2021-12-29 ENCOUNTER — Ambulatory Visit: Payer: Medicare PPO | Admitting: Physical Therapy

## 2022-02-26 DIAGNOSIS — Z03818 Encounter for observation for suspected exposure to other biological agents ruled out: Secondary | ICD-10-CM | POA: Diagnosis not present

## 2022-02-26 DIAGNOSIS — R059 Cough, unspecified: Secondary | ICD-10-CM | POA: Diagnosis not present

## 2022-02-28 DIAGNOSIS — R059 Cough, unspecified: Secondary | ICD-10-CM | POA: Diagnosis not present

## 2022-02-28 DIAGNOSIS — I10 Essential (primary) hypertension: Secondary | ICD-10-CM | POA: Diagnosis not present

## 2022-02-28 DIAGNOSIS — J209 Acute bronchitis, unspecified: Secondary | ICD-10-CM | POA: Diagnosis not present

## 2022-03-19 DIAGNOSIS — R059 Cough, unspecified: Secondary | ICD-10-CM | POA: Diagnosis not present

## 2022-03-19 DIAGNOSIS — I10 Essential (primary) hypertension: Secondary | ICD-10-CM | POA: Diagnosis not present

## 2022-03-19 DIAGNOSIS — N1831 Chronic kidney disease, stage 3a: Secondary | ICD-10-CM | POA: Diagnosis not present

## 2022-03-19 DIAGNOSIS — Z23 Encounter for immunization: Secondary | ICD-10-CM | POA: Diagnosis not present

## 2022-03-19 DIAGNOSIS — I6523 Occlusion and stenosis of bilateral carotid arteries: Secondary | ICD-10-CM | POA: Diagnosis not present

## 2022-03-19 DIAGNOSIS — J321 Chronic frontal sinusitis: Secondary | ICD-10-CM | POA: Diagnosis not present

## 2022-03-19 DIAGNOSIS — E1169 Type 2 diabetes mellitus with other specified complication: Secondary | ICD-10-CM | POA: Diagnosis not present

## 2022-04-03 ENCOUNTER — Other Ambulatory Visit: Payer: Self-pay | Admitting: Internal Medicine

## 2022-04-03 ENCOUNTER — Ambulatory Visit
Admission: RE | Admit: 2022-04-03 | Discharge: 2022-04-03 | Disposition: A | Discharge status: Home / Self Care | Payer: Medicare PPO | Source: Ambulatory Visit | Attending: Internal Medicine | Admitting: Internal Medicine

## 2022-04-03 DIAGNOSIS — M25511 Pain in right shoulder: Secondary | ICD-10-CM

## 2022-04-03 DIAGNOSIS — M25519 Pain in unspecified shoulder: Secondary | ICD-10-CM | POA: Diagnosis not present

## 2022-04-10 DIAGNOSIS — M25511 Pain in right shoulder: Secondary | ICD-10-CM | POA: Diagnosis not present

## 2022-04-17 DIAGNOSIS — I10 Essential (primary) hypertension: Secondary | ICD-10-CM | POA: Diagnosis not present

## 2022-04-17 DIAGNOSIS — J321 Chronic frontal sinusitis: Secondary | ICD-10-CM | POA: Diagnosis not present

## 2022-04-17 DIAGNOSIS — Z0000 Encounter for general adult medical examination without abnormal findings: Secondary | ICD-10-CM | POA: Diagnosis not present

## 2022-04-17 DIAGNOSIS — Z1211 Encounter for screening for malignant neoplasm of colon: Secondary | ICD-10-CM | POA: Diagnosis not present

## 2022-04-17 DIAGNOSIS — N1831 Chronic kidney disease, stage 3a: Secondary | ICD-10-CM | POA: Diagnosis not present

## 2022-04-17 DIAGNOSIS — Z23 Encounter for immunization: Secondary | ICD-10-CM | POA: Diagnosis not present

## 2022-04-17 DIAGNOSIS — E1169 Type 2 diabetes mellitus with other specified complication: Secondary | ICD-10-CM | POA: Diagnosis not present

## 2022-04-17 DIAGNOSIS — Z1231 Encounter for screening mammogram for malignant neoplasm of breast: Secondary | ICD-10-CM | POA: Diagnosis not present

## 2022-04-17 DIAGNOSIS — M25519 Pain in unspecified shoulder: Secondary | ICD-10-CM | POA: Diagnosis not present

## 2022-04-17 DIAGNOSIS — I6523 Occlusion and stenosis of bilateral carotid arteries: Secondary | ICD-10-CM | POA: Diagnosis not present

## 2022-04-17 DIAGNOSIS — E785 Hyperlipidemia, unspecified: Secondary | ICD-10-CM | POA: Diagnosis not present

## 2022-05-17 DIAGNOSIS — R350 Frequency of micturition: Secondary | ICD-10-CM | POA: Diagnosis not present

## 2022-05-17 DIAGNOSIS — E1165 Type 2 diabetes mellitus with hyperglycemia: Secondary | ICD-10-CM | POA: Diagnosis not present

## 2022-05-17 DIAGNOSIS — R42 Dizziness and giddiness: Secondary | ICD-10-CM | POA: Diagnosis not present

## 2022-05-29 ENCOUNTER — Other Ambulatory Visit: Payer: Self-pay | Admitting: Internal Medicine

## 2022-05-29 DIAGNOSIS — Z1231 Encounter for screening mammogram for malignant neoplasm of breast: Secondary | ICD-10-CM

## 2022-06-07 DIAGNOSIS — N1831 Chronic kidney disease, stage 3a: Secondary | ICD-10-CM | POA: Diagnosis not present

## 2022-06-07 DIAGNOSIS — E1169 Type 2 diabetes mellitus with other specified complication: Secondary | ICD-10-CM | POA: Diagnosis not present

## 2022-06-09 ENCOUNTER — Ambulatory Visit
Admission: EM | Admit: 2022-06-09 | Discharge: 2022-06-09 | Disposition: A | Discharge status: Home / Self Care | Payer: Medicare PPO | Attending: Family Medicine | Admitting: Family Medicine

## 2022-06-09 DIAGNOSIS — U071 COVID-19: Secondary | ICD-10-CM | POA: Diagnosis not present

## 2022-06-09 LAB — POCT FASTING CBG KUC MANUAL ENTRY: POCT Glucose (KUC): 144 mg/dL — AB (ref 70–99)

## 2022-06-09 MED ORDER — BENZONATATE 100 MG PO CAPS: 100.0000 mg | ORAL_CAPSULE | Freq: Three times a day (TID) | ORAL | 0 refills | Status: DC

## 2022-06-09 MED ORDER — MOLNUPIRAVIR EUA 200MG CAPSULE: 4.0000 | ORAL_CAPSULE | Freq: Two times a day (BID) | ORAL | 0 refills | Status: AC

## 2022-06-09 NOTE — ED Triage Notes (Signed)
Pt she has been exposed to covid x 1 day. Pt is weak, fatigue, and nasal congestion.

## 2022-06-09 NOTE — ED Provider Notes (Signed)
[facility]    CSN: [id number] Arrival date & time: 06/09/22  1150      History   Chief Complaint No chief complaint on file.   HPI Ariel Meyer is a 72 y.o. female.   Patient presenting today with 1 day history of weakness, fatigue, congestion, cough, fever, body aches, chills.  Denies chest pain, shortness of breath, abdominal pain, nausea vomiting or diarrhea.  Husband currently hospitalized with COVID.  Trying Tylenol with no relief.  No known history of chronic pulmonary disease, but does have a history of pneumonia.    Past Medical History:  Diagnosis Date   Anxiety    Arthritis    Back problem    Chronic kidney disease    Complication of anesthesia    wakes up durning surgery   Depression    Diabetes mellitus without complication ([facility])    Dizziness    GERD (gastroesophageal reflux disease)    Hyperlipidemia    Hypertension    Pneumonia    Seasonal allergies    Shortness of breath dyspnea    Sleep apnea     Patient Active Problem List   Diagnosis Date Noted   Upper airway cough syndrome 06/21/2015   Acute encephalopathy 05/10/2014   Alcohol withdrawal delirium ([place]) 05/10/2014   Difficult intubation    Acute respiratory failure with hypoxia ([place])    Angioedema 05/06/2014   Goiter 05/06/2014   Hypertension 05/06/2014   Acute sinusitis 02/10/2014   Nausea with vomiting 02/10/2014   DM (diabetes mellitus), type 2 with renal complications ([place]) 40/98/1191   Renal insufficiency 02/07/2014   Acute renal failure ([place]) 02/07/2014   Enteritis due to Clostridium difficile 02/07/2014   Hypotension 02/07/2014   Benign hypertension 02/07/2014   Obesity, morbid ([place]) 02/07/2014   GERD (gastroesophageal reflux disease) 02/07/2014   Hyperlipidemia 02/07/2014   Sciatica 06/10/2012   Lumbar compression fracture ([place]) 06/10/2012    Past Surgical History:  Procedure Laterality Date   BACK SURGERY     KYPHOPLASTY N/A 08/12/2012   Procedure:  KYPHOPLASTY;  Surgeon: Otilio Connors, MD;  Location: [facility];  Service: Neurosurgery;  Laterality: N/A;  L1 Balloon Kyphoplasty   NASAL SINUS SURGERY     TONSILLECTOMY     TUBAL LIGATION     OB History   No obstetric history on file.    Home Medications    Prior to Admission medications   Medication Sig Start Date End Date Taking? Authorizing Provider  benzonatate (TESSALON) 100 MG capsule Take 1 capsule (100 mg total) by mouth every 8 (eight) hours. 06/09/22  Yes Volney American, PA-C  molnupiravir EUA (LAGEVRIO) 200 mg CAPS capsule Take 4 capsules (800 mg total) by mouth 2 (two) times daily for 5 days. 06/09/22 06/14/22 Yes Volney American, PA-C  acetaminophen (TYLENOL) 500 MG tablet Take 500 mg 2 (two) times daily by mouth.    [provider]  albuterol (VENTOLIN HFA) 108 (90 Base) MCG/ACT inhaler Inhale into the lungs every 6 (six) hours as needed for wheezing or shortness of breath.    [provider]  atorvastatin (LIPITOR) 40 MG tablet Take 40 mg by mouth daily.    [provider]  buPROPion (WELLBUTRIN XL) 150 MG 24 hr tablet Take by mouth daily.    [provider]  calcium-vitamin D (OSCAL WITH D) 500-5 MG-MCG tablet Take 1 tablet by mouth.    [provider]  cholecalciferol (VITAMIN D) 1000 UNITS tablet Take 1,000  Units by mouth daily.    [provider]  cyclobenzaprine (FLEXERIL) 10 MG tablet Take 10 mg by mouth 3 (three) times daily as needed for muscle spasms.    [provider]  docusate sodium (COLACE) 50 MG capsule Take 50 mg by mouth daily.    [provider]  famotidine (PEPCID) 10 MG tablet Take 10 mg 2 (two) times daily as needed by mouth for heartburn or indigestion.    [provider]  FLUoxetine (PROZAC) 40 MG capsule Take 40 mg by mouth daily.    [provider]  fluticasone (FLONASE) 50 MCG/ACT nasal spray Place 2 sprays into both nostrils daily. 02/12/14    Joseph Art, DO  gabapentin (NEURONTIN) 100 MG capsule Take 100-300 mg at bedtime as needed by mouth (pain). Patient not taking: Reported on 10/17/2021    [provider]  guaiFENesin (MUCINEX) 600 MG 12 hr tablet Take by mouth 2 (two) times daily.    [provider]  hydrochlorothiazide (MICROZIDE) 12.5 MG capsule Take 12.5 mg by mouth daily.    [provider]  levocetirizine (XYZAL) 5 MG tablet Take 5 mg by mouth every evening.    [provider]  meloxicam (MOBIC) 15 MG tablet Take 15 mg by mouth daily.    [provider]  montelukast (SINGULAIR) 10 MG tablet Take 10 mg by mouth at bedtime.    [provider]  Multiple Vitamin (MULTIVITAMIN WITH MINERALS) TABS Take 1 tablet by mouth daily.    [provider]  omeprazole (PRILOSEC) 20 MG capsule Take 20 mg daily by mouth. Patient not taking: Reported on 10/17/2021    [provider]  ondansetron (ZOFRAN) 4 MG tablet Take 4 mg by mouth daily.    [provider]  triamcinolone cream (KENALOG) 0.1 % Apply 1 application  topically 2 (two) times daily.    [provider]    Family History Family History  Problem Relation Age of Onset   Heart disease Other    Arthritis Other     Social History Social History   Tobacco Use   Smoking status: Former    Packs/day: 0.75    Years: 30.00    Total pack years: 22.50    Types: Cigarettes    Quit date: 05/14/2009    Years since quitting: 13.0   Smokeless tobacco: Never  Substance Use Topics   Alcohol use: Yes    Alcohol/week: 49.0 standard drinks of alcohol    Types: 35 Shots of liquor, 14 Standard drinks or equivalent per week   Drug use: No     Allergies   Lisinopril, Sudafed 12 hour [pseudoephedrine hcl er], Dilaudid [hydromorphone hcl], Fosamax [alendronate sodium], Levaquin leva-pak [levofloxacin], Macrodantin [nitrofurantoin macrocrystal], Macrodantin [nitrofurantoin], Norvasc [amlodipine  besylate], Penicillins, Sudafed [pseudoephedrine hcl], and Sulfa antibiotics   Review of Systems Review of Systems PER HPI  Physical Exam Triage Vital Signs ED Triage Vitals  Enc Vitals Group     BP 06/09/22 1300 128/84     Pulse Rate 06/09/22 1300 (!) 107     Resp 06/09/22 1300 (!) 22     Temp 06/09/22 1300 98.7 F (37.1 C)     Temp Source 06/09/22 1300 Oral     SpO2 06/09/22 1300 95 %     Weight --      Height --      Head Circumference --      Peak Flow --      Pain Score 06/09/22  1309 0     Pain Loc --      Pain Edu? --      Excl. in [place]? --    No data found.  Updated Vital Signs BP 128/84 (BP Location: Right Arm)   Pulse (!) 107   Temp 98.7 F (37.1 C) (Oral)   Resp (!) 22   SpO2 95%   Visual Acuity Right Eye Distance:   Left Eye Distance:   Bilateral Distance:    Right Eye Near:   Left Eye Near:    Bilateral Near:     Physical Exam Vitals and nursing note reviewed.  Constitutional:      Appearance: Normal appearance.  HENT:     Head: Atraumatic.     Right Ear: Tympanic membrane and external ear normal.     Left Ear: Tympanic membrane and external ear normal.     Nose: Congestion present.     Mouth/Throat:     Mouth: Mucous membranes are moist.     Pharynx: Posterior oropharyngeal erythema present.  Eyes:     Extraocular Movements: Extraocular movements intact.     Conjunctiva/sclera: Conjunctivae normal.  Cardiovascular:     Rate and Rhythm: Normal rate and regular rhythm.     Heart sounds: Normal heart sounds.  Pulmonary:     Effort: Pulmonary effort is normal.     Breath sounds: Normal breath sounds. No wheezing or rales.  Musculoskeletal:        General: Normal range of motion.     Cervical back: Normal range of motion and neck supple.  Skin:    General: Skin is warm and dry.  Neurological:     Mental Status: She is alert and oriented to person, place, and time.  Psychiatric:        Mood and Affect: Mood normal.        Thought  Content: Thought content normal.      UC Treatments / Results  Labs (all labs ordered are listed, but only abnormal results are displayed) Labs Reviewed  POCT FASTING CBG [facility] MANUAL ENTRY - Abnormal; Notable for the following components:      Result Value   POCT Glucose ([facility]) 144 (*)    All other components within normal limits    EKG   Radiology No results found.  Procedures Procedures (including critical care time)  Medications Ordered in UC Medications - No data to display  Initial Impression / Assessment and Plan / UC Course  I have reviewed the triage vital signs and the nursing notes.  Pertinent labs & imaging results that were available during my care of the patient were reviewed by me and considered in my medical decision making (see chart for details).     Tachycardic and mildly tachypneic in triage, otherwise vital signs reassuring today with oxygen saturation of 95% on room air and normal blood pressure reading.  She appears weak, fatigued but is alert, oriented, answering questions appropriately and breathing comfortably on room air, speaking in full sentences.  Highly likely that she also has COVID-19 as husband was just diagnosed yesterday and her symptoms are consistent.  She is a good candidate for molnupiravir, unable to take Paxlovid due to history of renal failure and chronic kidney disease.  Will also send tessalon perles. Discussed supportive home care and return precautions.  Final Clinical Impressions(s) / UC Diagnoses   Final diagnoses:  COVID   Discharge Instructions   None    ED Prescriptions  Medication Sig Dispense Auth. Provider   molnupiravir EUA (LAGEVRIO) 200 mg CAPS capsule Take 4 capsules (800 mg total) by mouth 2 (two) times daily for 5 days. 40 capsule Volney American, [place]   benzonatate (TESSALON) 100 MG capsule Take 1 capsule (100 mg total) by mouth every 8 (eight) hours. 21 capsule Volney American, [place]       PDMP not reviewed this encounter.   Volney American, [place] 06/09/22 1404

## 2022-06-23 DIAGNOSIS — E1169 Type 2 diabetes mellitus with other specified complication: Secondary | ICD-10-CM | POA: Diagnosis not present

## 2022-06-23 DIAGNOSIS — N1831 Chronic kidney disease, stage 3a: Secondary | ICD-10-CM | POA: Diagnosis not present

## 2022-06-23 DIAGNOSIS — E785 Hyperlipidemia, unspecified: Secondary | ICD-10-CM | POA: Diagnosis not present

## 2022-06-23 DIAGNOSIS — K219 Gastro-esophageal reflux disease without esophagitis: Secondary | ICD-10-CM | POA: Diagnosis not present

## 2022-06-23 DIAGNOSIS — I10 Essential (primary) hypertension: Secondary | ICD-10-CM | POA: Diagnosis not present

## 2022-06-25 DIAGNOSIS — F3341 Major depressive disorder, recurrent, in partial remission: Secondary | ICD-10-CM | POA: Diagnosis not present

## 2022-06-25 DIAGNOSIS — R000 Tachycardia, unspecified: Secondary | ICD-10-CM | POA: Diagnosis not present

## 2022-06-25 DIAGNOSIS — I10 Essential (primary) hypertension: Secondary | ICD-10-CM | POA: Diagnosis not present

## 2022-06-25 DIAGNOSIS — R0609 Other forms of dyspnea: Secondary | ICD-10-CM | POA: Diagnosis not present

## 2022-06-25 DIAGNOSIS — R42 Dizziness and giddiness: Secondary | ICD-10-CM | POA: Diagnosis not present

## 2022-06-25 DIAGNOSIS — Z8616 Personal history of COVID-19: Secondary | ICD-10-CM | POA: Diagnosis not present

## 2022-06-25 DIAGNOSIS — J0100 Acute maxillary sinusitis, unspecified: Secondary | ICD-10-CM | POA: Diagnosis not present

## 2022-06-25 DIAGNOSIS — N1831 Chronic kidney disease, stage 3a: Secondary | ICD-10-CM | POA: Diagnosis not present

## 2022-06-25 DIAGNOSIS — E1122 Type 2 diabetes mellitus with diabetic chronic kidney disease: Secondary | ICD-10-CM | POA: Diagnosis not present

## 2022-07-10 DIAGNOSIS — M71341 Other bursal cyst, right hand: Secondary | ICD-10-CM | POA: Diagnosis not present

## 2022-07-16 DIAGNOSIS — E119 Type 2 diabetes mellitus without complications: Secondary | ICD-10-CM | POA: Diagnosis not present

## 2022-07-16 DIAGNOSIS — Z961 Presence of intraocular lens: Secondary | ICD-10-CM | POA: Diagnosis not present

## 2022-07-16 DIAGNOSIS — H524 Presbyopia: Secondary | ICD-10-CM | POA: Diagnosis not present

## 2022-07-18 ENCOUNTER — Ambulatory Visit
Admission: RE | Admit: 2022-07-18 | Discharge: 2022-07-18 | Disposition: A | Discharge status: Home / Self Care | Payer: Medicare PPO | Source: Ambulatory Visit | Attending: Internal Medicine | Admitting: Internal Medicine

## 2022-07-18 DIAGNOSIS — Z1231 Encounter for screening mammogram for malignant neoplasm of breast: Secondary | ICD-10-CM | POA: Diagnosis not present

## 2022-07-20 DIAGNOSIS — N289 Disorder of kidney and ureter, unspecified: Secondary | ICD-10-CM | POA: Diagnosis not present

## 2022-07-20 DIAGNOSIS — Z8616 Personal history of COVID-19: Secondary | ICD-10-CM | POA: Diagnosis not present

## 2022-07-20 DIAGNOSIS — E1169 Type 2 diabetes mellitus with other specified complication: Secondary | ICD-10-CM | POA: Diagnosis not present

## 2022-07-20 DIAGNOSIS — N189 Chronic kidney disease, unspecified: Secondary | ICD-10-CM | POA: Diagnosis not present

## 2022-07-20 DIAGNOSIS — Z9109 Other allergy status, other than to drugs and biological substances: Secondary | ICD-10-CM | POA: Diagnosis not present

## 2022-07-20 DIAGNOSIS — E1122 Type 2 diabetes mellitus with diabetic chronic kidney disease: Secondary | ICD-10-CM | POA: Diagnosis not present

## 2022-07-20 DIAGNOSIS — I10 Essential (primary) hypertension: Secondary | ICD-10-CM | POA: Diagnosis not present

## 2022-07-23 ENCOUNTER — Encounter (HOSPITAL_COMMUNITY): Payer: Self-pay | Admitting: Dermatology

## 2022-07-23 ENCOUNTER — Ambulatory Visit (HOSPITAL_COMMUNITY)
Admission: RE | Admit: 2022-07-23 | Discharge: 2022-07-23 | Disposition: A | Discharge status: Home / Self Care | Payer: Medicare PPO | Source: Ambulatory Visit | Attending: Dermatology | Admitting: Dermatology

## 2022-07-23 ENCOUNTER — Other Ambulatory Visit (HOSPITAL_COMMUNITY): Payer: Self-pay | Admitting: Dermatology

## 2022-07-23 DIAGNOSIS — L03011 Cellulitis of right finger: Secondary | ICD-10-CM | POA: Diagnosis not present

## 2022-07-23 DIAGNOSIS — M7989 Other specified soft tissue disorders: Secondary | ICD-10-CM | POA: Diagnosis not present

## 2022-07-23 DIAGNOSIS — M19041 Primary osteoarthritis, right hand: Secondary | ICD-10-CM | POA: Diagnosis not present

## 2022-07-26 DIAGNOSIS — E785 Hyperlipidemia, unspecified: Secondary | ICD-10-CM | POA: Diagnosis not present

## 2022-07-26 DIAGNOSIS — K219 Gastro-esophageal reflux disease without esophagitis: Secondary | ICD-10-CM | POA: Diagnosis not present

## 2022-07-26 DIAGNOSIS — N1831 Chronic kidney disease, stage 3a: Secondary | ICD-10-CM | POA: Diagnosis not present

## 2022-07-26 DIAGNOSIS — F3341 Major depressive disorder, recurrent, in partial remission: Secondary | ICD-10-CM | POA: Diagnosis not present

## 2022-07-26 DIAGNOSIS — E1169 Type 2 diabetes mellitus with other specified complication: Secondary | ICD-10-CM | POA: Diagnosis not present

## 2022-07-26 DIAGNOSIS — I10 Essential (primary) hypertension: Secondary | ICD-10-CM | POA: Diagnosis not present

## 2022-07-26 DIAGNOSIS — E11649 Type 2 diabetes mellitus with hypoglycemia without coma: Secondary | ICD-10-CM | POA: Diagnosis not present

## 2022-07-31 DIAGNOSIS — M7130 Other bursal cyst, unspecified site: Secondary | ICD-10-CM | POA: Diagnosis not present

## 2022-08-20 DIAGNOSIS — L820 Inflamed seborrheic keratosis: Secondary | ICD-10-CM | POA: Diagnosis not present

## 2022-08-20 DIAGNOSIS — D225 Melanocytic nevi of trunk: Secondary | ICD-10-CM | POA: Diagnosis not present

## 2022-08-20 DIAGNOSIS — Z1283 Encounter for screening for malignant neoplasm of skin: Secondary | ICD-10-CM | POA: Diagnosis not present

## 2022-08-29 DIAGNOSIS — K625 Hemorrhage of anus and rectum: Secondary | ICD-10-CM | POA: Diagnosis not present

## 2022-08-29 DIAGNOSIS — K5909 Other constipation: Secondary | ICD-10-CM | POA: Diagnosis not present

## 2022-09-03 DIAGNOSIS — E785 Hyperlipidemia, unspecified: Secondary | ICD-10-CM | POA: Diagnosis not present

## 2022-09-03 DIAGNOSIS — E1122 Type 2 diabetes mellitus with diabetic chronic kidney disease: Secondary | ICD-10-CM | POA: Diagnosis not present

## 2022-09-03 DIAGNOSIS — N390 Urinary tract infection, site not specified: Secondary | ICD-10-CM | POA: Diagnosis not present

## 2022-09-03 DIAGNOSIS — E559 Vitamin D deficiency, unspecified: Secondary | ICD-10-CM | POA: Diagnosis not present

## 2022-09-03 DIAGNOSIS — N1832 Chronic kidney disease, stage 3b: Secondary | ICD-10-CM | POA: Diagnosis not present

## 2022-09-03 DIAGNOSIS — I129 Hypertensive chronic kidney disease with stage 1 through stage 4 chronic kidney disease, or unspecified chronic kidney disease: Secondary | ICD-10-CM | POA: Diagnosis not present

## 2022-09-12 DIAGNOSIS — L84 Corns and callosities: Secondary | ICD-10-CM | POA: Diagnosis not present

## 2022-09-12 DIAGNOSIS — E139 Other specified diabetes mellitus without complications: Secondary | ICD-10-CM | POA: Diagnosis not present

## 2022-09-12 DIAGNOSIS — M2012 Hallux valgus (acquired), left foot: Secondary | ICD-10-CM | POA: Diagnosis not present

## 2022-09-12 DIAGNOSIS — D2372 Other benign neoplasm of skin of left lower limb, including hip: Secondary | ICD-10-CM | POA: Diagnosis not present

## 2022-09-12 DIAGNOSIS — M792 Neuralgia and neuritis, unspecified: Secondary | ICD-10-CM | POA: Diagnosis not present

## 2022-09-12 DIAGNOSIS — M2011 Hallux valgus (acquired), right foot: Secondary | ICD-10-CM | POA: Diagnosis not present

## 2022-09-12 DIAGNOSIS — I70203 Unspecified atherosclerosis of native arteries of extremities, bilateral legs: Secondary | ICD-10-CM | POA: Diagnosis not present

## 2022-09-13 ENCOUNTER — Other Ambulatory Visit: Payer: Self-pay | Admitting: Nephrology

## 2022-09-13 DIAGNOSIS — N1832 Chronic kidney disease, stage 3b: Secondary | ICD-10-CM

## 2022-09-14 DIAGNOSIS — M25511 Pain in right shoulder: Secondary | ICD-10-CM | POA: Diagnosis not present

## 2022-09-26 DIAGNOSIS — N1832 Chronic kidney disease, stage 3b: Secondary | ICD-10-CM | POA: Diagnosis not present

## 2022-09-26 DIAGNOSIS — I6523 Occlusion and stenosis of bilateral carotid arteries: Secondary | ICD-10-CM | POA: Diagnosis not present

## 2022-09-26 DIAGNOSIS — E1165 Type 2 diabetes mellitus with hyperglycemia: Secondary | ICD-10-CM | POA: Diagnosis not present

## 2022-09-26 DIAGNOSIS — E559 Vitamin D deficiency, unspecified: Secondary | ICD-10-CM | POA: Diagnosis not present

## 2022-09-26 DIAGNOSIS — I10 Essential (primary) hypertension: Secondary | ICD-10-CM | POA: Diagnosis not present

## 2022-09-26 DIAGNOSIS — R945 Abnormal results of liver function studies: Secondary | ICD-10-CM | POA: Diagnosis not present

## 2022-10-03 ENCOUNTER — Ambulatory Visit
Admission: RE | Admit: 2022-10-03 | Discharge: 2022-10-03 | Disposition: A | Discharge status: Home / Self Care | Payer: Medicare PPO | Source: Ambulatory Visit | Attending: Nephrology | Admitting: Nephrology

## 2022-10-03 DIAGNOSIS — N183 Chronic kidney disease, stage 3 unspecified: Secondary | ICD-10-CM | POA: Diagnosis not present

## 2022-10-03 DIAGNOSIS — N1832 Chronic kidney disease, stage 3b: Secondary | ICD-10-CM

## 2022-10-04 DIAGNOSIS — I70203 Unspecified atherosclerosis of native arteries of extremities, bilateral legs: Secondary | ICD-10-CM | POA: Diagnosis not present

## 2022-10-04 DIAGNOSIS — M2011 Hallux valgus (acquired), right foot: Secondary | ICD-10-CM | POA: Diagnosis not present

## 2022-10-04 DIAGNOSIS — L84 Corns and callosities: Secondary | ICD-10-CM | POA: Diagnosis not present

## 2022-10-04 DIAGNOSIS — M25551 Pain in right hip: Secondary | ICD-10-CM | POA: Diagnosis not present

## 2022-10-04 DIAGNOSIS — M792 Neuralgia and neuritis, unspecified: Secondary | ICD-10-CM | POA: Diagnosis not present

## 2022-10-04 DIAGNOSIS — D2372 Other benign neoplasm of skin of left lower limb, including hip: Secondary | ICD-10-CM | POA: Diagnosis not present

## 2022-10-04 DIAGNOSIS — M2012 Hallux valgus (acquired), left foot: Secondary | ICD-10-CM | POA: Diagnosis not present

## 2022-10-04 DIAGNOSIS — E139 Other specified diabetes mellitus without complications: Secondary | ICD-10-CM | POA: Diagnosis not present

## 2022-10-12 DIAGNOSIS — F321 Major depressive disorder, single episode, moderate: Secondary | ICD-10-CM | POA: Diagnosis not present

## 2022-10-16 DIAGNOSIS — E1122 Type 2 diabetes mellitus with diabetic chronic kidney disease: Secondary | ICD-10-CM | POA: Diagnosis not present

## 2022-10-16 DIAGNOSIS — E1165 Type 2 diabetes mellitus with hyperglycemia: Secondary | ICD-10-CM | POA: Diagnosis not present

## 2022-10-16 DIAGNOSIS — I10 Essential (primary) hypertension: Secondary | ICD-10-CM | POA: Diagnosis not present

## 2022-10-16 DIAGNOSIS — N1832 Chronic kidney disease, stage 3b: Secondary | ICD-10-CM | POA: Diagnosis not present

## 2022-10-16 DIAGNOSIS — J45909 Unspecified asthma, uncomplicated: Secondary | ICD-10-CM | POA: Diagnosis not present

## 2022-10-18 DIAGNOSIS — M778 Other enthesopathies, not elsewhere classified: Secondary | ICD-10-CM | POA: Diagnosis not present

## 2022-10-18 DIAGNOSIS — M2012 Hallux valgus (acquired), left foot: Secondary | ICD-10-CM | POA: Diagnosis not present

## 2022-10-18 DIAGNOSIS — D2372 Other benign neoplasm of skin of left lower limb, including hip: Secondary | ICD-10-CM | POA: Diagnosis not present

## 2022-10-18 DIAGNOSIS — L84 Corns and callosities: Secondary | ICD-10-CM | POA: Diagnosis not present

## 2022-10-18 DIAGNOSIS — I70203 Unspecified atherosclerosis of native arteries of extremities, bilateral legs: Secondary | ICD-10-CM | POA: Diagnosis not present

## 2022-10-18 DIAGNOSIS — E139 Other specified diabetes mellitus without complications: Secondary | ICD-10-CM | POA: Diagnosis not present

## 2022-10-18 DIAGNOSIS — M792 Neuralgia and neuritis, unspecified: Secondary | ICD-10-CM | POA: Diagnosis not present

## 2022-10-18 DIAGNOSIS — M2011 Hallux valgus (acquired), right foot: Secondary | ICD-10-CM | POA: Diagnosis not present

## 2022-10-23 DIAGNOSIS — M25551 Pain in right hip: Secondary | ICD-10-CM | POA: Diagnosis not present

## 2022-10-25 DIAGNOSIS — M25551 Pain in right hip: Secondary | ICD-10-CM | POA: Diagnosis not present

## 2022-10-30 DIAGNOSIS — K5909 Other constipation: Secondary | ICD-10-CM | POA: Diagnosis not present

## 2022-10-31 ENCOUNTER — Encounter (HOSPITAL_COMMUNITY): Payer: Self-pay

## 2022-10-31 ENCOUNTER — Observation Stay (HOSPITAL_COMMUNITY)
Admission: EM | Admit: 2022-10-31 | Discharge: 2022-11-01 | Disposition: A | Discharge status: Home / Self Care | Payer: Medicare PPO | Attending: Emergency Medicine | Admitting: Emergency Medicine

## 2022-10-31 ENCOUNTER — Other Ambulatory Visit: Payer: Self-pay

## 2022-10-31 DIAGNOSIS — N1832 Chronic kidney disease, stage 3b: Secondary | ICD-10-CM | POA: Insufficient documentation

## 2022-10-31 DIAGNOSIS — E876 Hypokalemia: Secondary | ICD-10-CM | POA: Insufficient documentation

## 2022-10-31 DIAGNOSIS — E1165 Type 2 diabetes mellitus with hyperglycemia: Secondary | ICD-10-CM | POA: Diagnosis not present

## 2022-10-31 DIAGNOSIS — R739 Hyperglycemia, unspecified: Secondary | ICD-10-CM | POA: Diagnosis not present

## 2022-10-31 DIAGNOSIS — N179 Acute kidney failure, unspecified: Secondary | ICD-10-CM | POA: Insufficient documentation

## 2022-10-31 DIAGNOSIS — E1129 Type 2 diabetes mellitus with other diabetic kidney complication: Secondary | ICD-10-CM | POA: Diagnosis present

## 2022-10-31 DIAGNOSIS — E1122 Type 2 diabetes mellitus with diabetic chronic kidney disease: Secondary | ICD-10-CM | POA: Diagnosis not present

## 2022-10-31 DIAGNOSIS — Z79899 Other long term (current) drug therapy: Secondary | ICD-10-CM | POA: Diagnosis not present

## 2022-10-31 DIAGNOSIS — E871 Hypo-osmolality and hyponatremia: Secondary | ICD-10-CM | POA: Insufficient documentation

## 2022-10-31 DIAGNOSIS — Z87891 Personal history of nicotine dependence: Secondary | ICD-10-CM | POA: Insufficient documentation

## 2022-10-31 DIAGNOSIS — R000 Tachycardia, unspecified: Secondary | ICD-10-CM | POA: Diagnosis not present

## 2022-10-31 DIAGNOSIS — Z7984 Long term (current) use of oral hypoglycemic drugs: Secondary | ICD-10-CM | POA: Diagnosis not present

## 2022-10-31 DIAGNOSIS — I129 Hypertensive chronic kidney disease with stage 1 through stage 4 chronic kidney disease, or unspecified chronic kidney disease: Secondary | ICD-10-CM | POA: Insufficient documentation

## 2022-10-31 LAB — URINALYSIS, ROUTINE W REFLEX MICROSCOPIC
Bacteria, UA: NONE SEEN
Bilirubin Urine: NEGATIVE
Glucose, UA: >=500 mg/dL — AB
Hgb urine dipstick: NEGATIVE
Ketones, ur: NEGATIVE mg/dL
Leukocytes,Ua: NEGATIVE
Nitrite: NEGATIVE
Protein, ur: NEGATIVE mg/dL
Specific Gravity, Urine: 1.021 (ref 1.005–1.030)
pH: 5 (ref 5.0–8.0)

## 2022-10-31 LAB — CBC WITH DIFFERENTIAL/PLATELET
Abs Immature Granulocytes: 0.01 10*3/uL (ref 0.00–0.07)
Basophils Absolute: 0 10*3/uL (ref 0.0–0.1)
Basophils Relative: 0 %
Eosinophils Absolute: 0.1 10*3/uL (ref 0.0–0.5)
Eosinophils Relative: 1 %
HCT: 40 % (ref 36.0–46.0)
Hemoglobin: 13.8 g/dL (ref 12.0–15.0)
Immature Granulocytes: 0 %
Lymphocytes Relative: 23 %
Lymphs Abs: 2.2 10*3/uL (ref 0.7–4.0)
MCH: 30.9 pg (ref 26.0–34.0)
MCHC: 34.5 g/dL (ref 30.0–36.0)
MCV: 89.7 fL (ref 80.0–100.0)
Monocytes Absolute: 0.6 10*3/uL (ref 0.1–1.0)
Monocytes Relative: 6 %
Neutro Abs: 6.6 10*3/uL (ref 1.7–7.7)
Neutrophils Relative %: 70 %
Platelets: 233 10*3/uL (ref 150–400)
RBC: 4.46 MIL/uL (ref 3.87–5.11)
RDW: 12.3 % (ref 11.5–15.5)
WBC: 9.6 10*3/uL (ref 4.0–10.5)
nRBC: 0 % (ref 0.0–0.2)

## 2022-10-31 LAB — BASIC METABOLIC PANEL
Anion gap: 13 (ref 5–15)
BUN: 43 mg/dL — ABNORMAL HIGH (ref 8–23)
CO2: 21 mmol/L — ABNORMAL LOW (ref 22–32)
Calcium: 9 mg/dL (ref 8.9–10.3)
Chloride: 95 mmol/L — ABNORMAL LOW (ref 98–111)
Creatinine, Ser: 1.86 mg/dL — ABNORMAL HIGH (ref 0.44–1.00)
GFR, Estimated: 28 mL/min — ABNORMAL LOW (ref >60)
Glucose, Bld: 485 mg/dL — ABNORMAL HIGH (ref 70–99)
Potassium: 3.5 mmol/L (ref 3.5–5.1)
Sodium: 129 mmol/L — ABNORMAL LOW (ref 135–145)

## 2022-10-31 LAB — BLOOD GAS, VENOUS
Acid-base deficit: 1.7 mmol/L (ref 0.0–2.0)
Bicarbonate: 21.9 mmol/L (ref 20.0–28.0)
O2 Saturation: 99.6 %
Patient temperature: 37
pCO2, Ven: 33 mmHg — ABNORMAL LOW (ref 44–60)
pH, Ven: 7.43 (ref 7.25–7.43)
pO2, Ven: 137 mmHg — ABNORMAL HIGH (ref 32–45)

## 2022-10-31 LAB — BETA-HYDROXYBUTYRIC ACID: Beta-Hydroxybutyric Acid: 0.95 mmol/L — ABNORMAL HIGH (ref 0.05–0.27)

## 2022-10-31 LAB — CBG MONITORING, ED
Glucose-Capillary: 428 mg/dL — ABNORMAL HIGH (ref 70–99)
Glucose-Capillary: 417 mg/dL — ABNORMAL HIGH (ref 70–99)
Glucose-Capillary: 301 mg/dL — ABNORMAL HIGH (ref 70–99)

## 2022-10-31 MED ORDER — INSULIN ASPART PROT & ASPART (70-30 MIX) 100 UNIT/ML ~~LOC~~ SUSP
10.0000 [IU] | Freq: Once | SUBCUTANEOUS | Status: DC
  Filled 2022-10-31: qty 10

## 2022-10-31 MED ORDER — DEXTROSE 50 % IV SOLN: 0.0000 mL | INTRAVENOUS | Status: DC | PRN

## 2022-10-31 MED ORDER — LACTATED RINGERS IV BOLUS
1000.0000 mL | INTRAVENOUS | Status: AC
  Administered 2022-10-31: 1000 mL via INTRAVENOUS

## 2022-10-31 MED ORDER — INSULIN ASPART 100 UNIT/ML IJ SOLN
8.0000 [IU] | Freq: Once | INTRAMUSCULAR | Status: DC
  Filled 2022-10-31: qty 0.08

## 2022-10-31 MED ORDER — LACTATED RINGERS IV SOLN: INTRAVENOUS | Status: DC

## 2022-10-31 MED ORDER — DEXTROSE IN LACTATED RINGERS 5 % IV SOLN: INTRAVENOUS | Status: DC

## 2022-10-31 MED ORDER — INSULIN REGULAR(HUMAN) IN NACL 100-0.9 UT/100ML-% IV SOLN: INTRAVENOUS | Status: DC

## 2022-10-31 MED ORDER — INSULIN ASPART 100 UNIT/ML IJ SOLN
10.0000 [IU] | Freq: Once | INTRAMUSCULAR | Status: AC
  Administered 2022-10-31: 10 [IU] via SUBCUTANEOUS
  Filled 2022-10-31: qty 0.1

## 2022-10-31 NOTE — ED Triage Notes (Addendum)
Patient BIB [facility] from home. Her sugar monitor has been reading HIGH for the last few days. In March, her diabetic medications were cut in half. Has been taking medications as prescribed. Feeling unwell and unable to concentrate. No nausea or vomiting. Increased urination and thirsty. Said she feels drunk.   EMS 18G left forearm normal saline CBG 514

## 2022-10-31 NOTE — ED Notes (Signed)
ED MD verbalizes LR only for now until labs return.

## 2022-10-31 NOTE — ED Provider Notes (Signed)
Received at shift change from Evlyn Kanner, [facility] please see his note for full detail  In short patient with medical history including hypertension, arthritis, CKD, GERD, hyperlipidemia, diabetes, presenting concerns of elevated blood sugar.  Patient notes she initially was taking 2000 mg of metformin daily but this was cut down to 1500 daily back in March because her creatinine levels were elevated. she states that since then her blood sugars have been running in the 200-300, she notes over the last couple days she has been feeling more weak fatigued and dehydrated endorses increased urination as well as increased thirst.  Patient is that she went to her primary doctor today to have lab work drawn for a upcoming physical, and noted that her glucose was in the 400s, and she had a jump in her A1c from 8-13.  She was advised by her primary doctor to be further evaluated in the emergency department.  She does know that she is followed by [facility], she states that she is CKD stage III and her creatinine is normally around 1.6.  She denies any nausea vomiting fever chills cough congestion she has no other complaints.  Patient also notes that she was told by her kidney doctor to come here since her creatinine is getting worse and her blood sugar is now under control.  Per previous provider follow-up on the patient's and determine disposition  Reviewing patient's chartPatient has a AKI with a glucose in the 300s, this is decreased from 480 but she is just taking metformin, she has poor creatinine clearance, uncontrolled diabetes, and poor medical understanding coupled with patient's comorbidities, I feel patient would not do well being discharged home at this time.  likely benefit from inpatient management for diabetes control and consultation by diabetes coordinator.  Patient was in agreement with this plan will consult hospitalist team for admission  Spoke with Dr. Lyda Perone who evaluated the patient,  who verified patient's baseline creatinine of 1.6, current creatinine slightly above her baseline, he spoke with patient patient is agreement with outpatient follow-up, he provided medication recommendations for controlling of diabetes, please see his consultation note for full detail  I reassessed the patient she is resting comfortably, glucose is down trending, current CBG is 240, she is agreeable with discharge at this time.  As I was about to discharge the patient, nursing staff told me that patient would like to stay as she feels too weak to go home.  Patient was reassessed, states she feels too weak to leave and she is concerned about these new medication changes.  Spoke with hospitalist Dr. Lyda Perone who will admit the patient  will admit her for observation.           Carroll Sage, PA-C 11/01/22 0159    Tegeler, Canary Brim, MD 11/05/22 [phone]

## 2022-10-31 NOTE — ED Provider Notes (Signed)
[place] EMERGENCY DEPARTMENT AT [facility] Provider Note   CSN: [id number] Arrival date & time: 10/31/22  1704     History  Chief Complaint  Patient presents with   Hyperglycemia    Ariel Meyer is a 72 y.o. female history of, type 2 diabetes, GERD presented with a few days of hyperglycemia.  Patient states that she used to take 1000 mg metformin twice a day however her morning doses been reduced to 500 mg and since then her sugars have been in the 200-300s.  Patient states that she has had increased thirst and feels dehydrated and weak.  Patient is not on insulin.  Patient does endorse probably urea and polydipsia and states that she continuously feels dehydrated.  Patient denied chest pain, shortness of breath, fevers, nausea/vomiting, abdominal pain, change in sensation/motor skills  Home Medications Prior to Admission medications   Medication Sig Start Date End Date Taking? Authorizing Provider  acetaminophen (TYLENOL) 500 MG tablet Take 500 mg 2 (two) times daily by mouth.    [provider]  albuterol (VENTOLIN HFA) 108 (90 Base) MCG/ACT inhaler Inhale into the lungs every 6 (six) hours as needed for wheezing or shortness of breath.    [provider]  atorvastatin (LIPITOR) 40 MG tablet Take 40 mg by mouth daily.    [provider]  benzonatate (TESSALON) 100 MG capsule Take 1 capsule (100 mg total) by mouth every 8 (eight) hours. 06/09/22   Particia Nearing, PA-C  buPROPion (WELLBUTRIN XL) 150 MG 24 hr tablet Take by mouth daily.    [provider]  calcium-vitamin D (OSCAL WITH D) 500-5 MG-MCG tablet Take 1 tablet by mouth.    [provider]  cholecalciferol (VITAMIN D) 1000 UNITS tablet Take 1,000 Units by mouth daily.    [provider]  cyclobenzaprine (FLEXERIL) 10 MG tablet Take 10 mg by mouth 3 (three) times daily as needed for muscle spasms.    [provider]  docusate sodium  (COLACE) 50 MG capsule Take 50 mg by mouth daily.    [provider]  famotidine (PEPCID) 10 MG tablet Take 10 mg 2 (two) times daily as needed by mouth for heartburn or indigestion.    [provider]  FLUoxetine (PROZAC) 40 MG capsule Take 40 mg by mouth daily.    [provider]  fluticasone (FLONASE) 50 MCG/ACT nasal spray Place 2 sprays into both nostrils daily. 02/12/14   Joseph Art, DO  gabapentin (NEURONTIN) 100 MG capsule Take 100-300 mg at bedtime as needed by mouth (pain). Patient not taking: Reported on 10/17/2021    [provider]  guaiFENesin (MUCINEX) 600 MG 12 hr tablet Take by mouth 2 (two) times daily.    [provider]  hydrochlorothiazide (MICROZIDE) 12.5 MG capsule Take 12.5 mg by mouth daily.    [provider]  levocetirizine (XYZAL) 5 MG tablet Take 5 mg by mouth every evening.    [provider]  meloxicam (MOBIC) 15 MG tablet Take 15 mg by mouth daily.    [provider]  montelukast (SINGULAIR) 10 MG tablet Take 10 mg by mouth at bedtime.    [provider]  Multiple Vitamin (MULTIVITAMIN WITH MINERALS) TABS Take 1 tablet by mouth daily.    [provider]  omeprazole (PRILOSEC) 20 MG capsule Take 20 mg daily by mouth. Patient not taking: Reported on 10/17/2021    [provider]  ondansetron (ZOFRAN) 4 MG tablet Take  4 mg by mouth daily.    [provider]  triamcinolone cream (KENALOG) 0.1 % Apply 1 application  topically 2 (two) times daily.    [provider]      Allergies    Lisinopril, Sudafed 12 hour [pseudoephedrine hcl er], Dilaudid [hydromorphone hcl], Fosamax [alendronate sodium], Levaquin leva-pak [levofloxacin], Macrodantin [nitrofurantoin macrocrystal], Macrodantin [nitrofurantoin], Norvasc [amlodipine besylate], Penicillins, Sudafed [pseudoephedrine hcl], and Sulfa antibiotics    Review of Systems   Review of Systems See HPI Physical  Exam Updated Vital Signs BP 126/87   Pulse 92   Temp 97.8 F (36.6 C)   Resp 18   Ht 5\' 8"  (1.727 m)   Wt 86.2 kg   SpO2 (!) 88%   BMI 28.89 kg/m  Physical Exam Vitals reviewed.  Constitutional:      General: She is not in acute distress. HENT:     Head: Normocephalic and atraumatic.     Mouth/Throat:     Mouth: Mucous membranes are dry.  Eyes:     Extraocular Movements: Extraocular movements intact.     Conjunctiva/sclera: Conjunctivae normal.     Pupils: Pupils are equal, round, and reactive to light.  Cardiovascular:     Rate and Rhythm: Normal rate and regular rhythm.     Pulses: Normal pulses.     Heart sounds: Normal heart sounds.     Comments: 2+ bilateral radial/dorsalis pedis pulses with regular rate Pulmonary:     Effort: Pulmonary effort is normal. No respiratory distress.     Breath sounds: Normal breath sounds.  Abdominal:     Palpations: Abdomen is soft.     Tenderness: There is no abdominal tenderness. There is no guarding or rebound.  Musculoskeletal:        General: Normal range of motion.     Cervical back: Normal range of motion and neck supple.     Comments: 5 out of 5 bilateral grip/leg extension strength  Skin:    General: Skin is warm and dry.     Capillary Refill: Capillary refill takes less than 2 seconds.  Neurological:     General: No focal deficit present.     Mental Status: She is alert and oriented to person, place, and time.     Comments: Sensation intact in all 4 limbs  Psychiatric:        Mood and Affect: Mood normal.     ED Results / Procedures / Treatments   Labs (all labs ordered are listed, but only abnormal results are displayed) Labs Reviewed  BASIC METABOLIC PANEL - Abnormal; Notable for the following components:      Result Value   Sodium 129 (*)    Chloride 95 (*)    CO2 21 (*)    Glucose, Bld 485 (*)    BUN 43 (*)    Creatinine, Ser 1.86 (*)    GFR, Estimated 28 (*)    All other components within normal limits   URINALYSIS, ROUTINE W REFLEX MICROSCOPIC - Abnormal; Notable for the following components:   Color, Urine STRAW (*)    Glucose, UA >=500 (*)    All other components within normal limits  BETA-HYDROXYBUTYRIC ACID - Abnormal; Notable for the following components:   Beta-Hydroxybutyric Acid 0.95 (*)    All other components within normal limits  BLOOD GAS, VENOUS - Abnormal; Notable for the following components:   pCO2, Ven 33 (*)    pO2, Ven 137 (*)    All other components within normal  limits  CBG MONITORING, ED - Abnormal; Notable for the following components:   Glucose-Capillary 428 (*)    All other components within normal limits  CBG MONITORING, ED - Abnormal; Notable for the following components:   Glucose-Capillary 417 (*)    All other components within normal limits  CBG MONITORING, ED - Abnormal; Notable for the following components:   Glucose-Capillary 301 (*)    All other components within normal limits  CBC WITH DIFFERENTIAL/PLATELET    EKG EKG Interpretation  Date/Time:  Wednesday October 31 2022 17:18:49 EDT Ventricular Rate:  107 PR Interval:  202 QRS Duration: 81 QT Interval:  335 QTC Calculation: 447 R Axis:   69 Text Interpretation: Sinus tachycardia Low voltage, extremity and precordial leads Probable anteroseptal infarct, old when compared to prior, similar tachycardia. No STEMI Confirmed by Theda Belfast ([id number]) on 10/31/2022 5:37:17 PM  Radiology No results found.  Procedures Procedures    Medications Ordered in ED Medications  insulin regular, human (MYXREDLIN) 100 units/ 100 mL infusion (0 Units/hr Intravenous Hold 10/31/22 1937)  lactated ringers infusion ( Intravenous New Bag/Given 10/31/22 1750)  dextrose 5 % in lactated ringers infusion (0 mLs Intravenous Hold 10/31/22 1937)  dextrose 50 % solution 0-50 mL (has no administration in time range)  lactated ringers bolus 1,000 mL (1,000 mLs Intravenous Not Given 10/31/22 2033)  insulin aspart  (novoLOG) injection 10 Units (10 Units Subcutaneous Given 10/31/22 2042)    ED Course/ Medical Decision Making/ A&P                             Medical Decision Making Amount and/or Complexity of Data Reviewed Labs: ordered.  Risk Prescription drug management.   Ariel Meyer 72 y.o. presented today for hyperglycemia. Working DDx that I considered at this time includes, but not limited to, hyperglycemic state, DKA, HHS, electrolyte abnormalities, anemia, dehydration, AKI.  R/o DDx: DKA, HHS, electrolyte abnormalities, anemia: These are considered less likely due to history of present illness and physical exam findings  Review of prior external notes: 06/09/2022 ED provider  Unique Tests and My Interpretation:  CBG: 428 UA: Glucose over 500 CBC: Unremarkable BMP: Elevated BUN 43, creatinine 1.6, GFR down to 28 Beta hydroxybutyric acid: Elevated 0.95 VBG: Unremarkable EKG: Sinus 107 bpm, no ST elevations or abnormalities noted  Discussion with Independent Historian: None  Discussion of Management of Tests: none  Risk: Medium: prescription drug management  Risk Stratification Score: None  Staffed with Tegeler, MD  Plan: Patient presented for hyperglycemia. On exam patient was in no acute distress but was tachycardic at 113.  Patient's physical exam was unremarkable.  Patient sugar when she arrived was 428.  Patient be given fluids and labs will be drawn.  Insulin will be withheld at this time until we have more information.  Patient stable at this time.  Patient's repeat CBG shows glucose only dropped to 417.  Patient be given 10 units of NovoLog along with her fluids and reassessed.  I spoke to the patient and she stated that she wants to be able to go home tonight and so we will do the insulin via subcu.  Patient stable at this time.  I suspect patient's AKI is secondary to dehydration from her hyperglycemia.  Patient's GFR is now less than 30 which means she is  unable to take metformin.  Patient's sugar did drop to 301 after receiving insulin and fluids.  Patient stable at this  time.  Patient was signed out to Berle Mull, PA-C.  The plan at this time is to talk with hospitalist to see if we do send the patient home when she can be placed on as her GFR is low and she can no longer be on metformin at this time.  If we cannot send her home on any medications safely at this time the night patient may be admitted for AKI into the morning when diabetic coordinator considered.         Final Clinical Impression(s) / ED Diagnoses Final diagnoses:  AKI (acute kidney injury) (HCC)  Hyperglycemia    Rx / DC Orders ED Discharge Orders     None         Remi Deter 10/31/22 2216    Tegeler, Canary Brim, MD 10/31/22 2340

## 2022-11-01 ENCOUNTER — Other Ambulatory Visit (HOSPITAL_COMMUNITY): Payer: Self-pay

## 2022-11-01 DIAGNOSIS — R739 Hyperglycemia, unspecified: Secondary | ICD-10-CM | POA: Diagnosis present

## 2022-11-01 DIAGNOSIS — E1122 Type 2 diabetes mellitus with diabetic chronic kidney disease: Secondary | ICD-10-CM

## 2022-11-01 DIAGNOSIS — N1832 Chronic kidney disease, stage 3b: Secondary | ICD-10-CM | POA: Diagnosis present

## 2022-11-01 LAB — BASIC METABOLIC PANEL
Anion gap: 9 (ref 5–15)
BUN: 35 mg/dL — ABNORMAL HIGH (ref 8–23)
CO2: 21 mmol/L — ABNORMAL LOW (ref 22–32)
Calcium: 9 mg/dL (ref 8.9–10.3)
Chloride: 103 mmol/L (ref 98–111)
Creatinine, Ser: 1.55 mg/dL — ABNORMAL HIGH (ref 0.44–1.00)
GFR, Estimated: 35 mL/min — ABNORMAL LOW (ref >60)
Glucose, Bld: 208 mg/dL — ABNORMAL HIGH (ref 70–99)
Potassium: 3.4 mmol/L — ABNORMAL LOW (ref 3.5–5.1)
Sodium: 133 mmol/L — ABNORMAL LOW (ref 135–145)

## 2022-11-01 LAB — HEMOGLOBIN A1C
Hgb A1c MFr Bld: 12.9 % — ABNORMAL HIGH (ref 4.8–5.6)
Mean Plasma Glucose: 323.53 mg/dL

## 2022-11-01 LAB — GLUCOSE, CAPILLARY
Glucose-Capillary: 205 mg/dL — ABNORMAL HIGH (ref 70–99)
Glucose-Capillary: 209 mg/dL — ABNORMAL HIGH (ref 70–99)
Glucose-Capillary: 244 mg/dL — ABNORMAL HIGH (ref 70–99)

## 2022-11-01 LAB — CBG MONITORING, ED: Glucose-Capillary: 240 mg/dL — ABNORMAL HIGH (ref 70–99)

## 2022-11-01 MED ORDER — POTASSIUM CHLORIDE CRYS ER 20 MEQ PO TBCR
30.0000 meq | EXTENDED_RELEASE_TABLET | Freq: Once | ORAL | Status: AC
  Administered 2022-11-01: 30 meq via ORAL
  Filled 2022-11-01: qty 1

## 2022-11-01 MED ORDER — ADULT MULTIVITAMIN W/MINERALS CH
1.0000 | ORAL_TABLET | Freq: Every day | ORAL | Status: DC
  Administered 2022-11-01: 1 via ORAL
  Filled 2022-11-01: qty 1

## 2022-11-01 MED ORDER — INSULIN ASPART 100 UNIT/ML IJ SOLN: 0.0000 [IU] | Freq: Three times a day (TID) | INTRAMUSCULAR | Status: DC

## 2022-11-01 MED ORDER — INSULIN GLARGINE-YFGN 100 UNIT/ML ~~LOC~~ SOLN
10.0000 [IU] | Freq: Every day | SUBCUTANEOUS | Status: DC
  Administered 2022-11-01: 10 [IU] via SUBCUTANEOUS
  Filled 2022-11-01 (×2): qty 0.1

## 2022-11-01 MED ORDER — RYBELSUS 7 MG PO TABS: 7.0000 mg | ORAL_TABLET | Freq: Every day | ORAL | 0 refills | Status: AC

## 2022-11-01 MED ORDER — VITAMIN D 25 MCG (1000 UNIT) PO TABS
1000.0000 [IU] | ORAL_TABLET | Freq: Every day | ORAL | Status: DC
  Administered 2022-11-01: 1000 [IU] via ORAL
  Filled 2022-11-01: qty 1

## 2022-11-01 MED ORDER — OXYCODONE HCL 5 MG PO TABS: 5.0000 mg | ORAL_TABLET | Freq: Four times a day (QID) | ORAL | Status: DC | PRN

## 2022-11-01 MED ORDER — NON FORMULARY: 7.0000 mg | Freq: Every day | Status: DC

## 2022-11-01 MED ORDER — ACETAMINOPHEN 325 MG PO TABS
650.0000 mg | ORAL_TABLET | Freq: Once | ORAL | Status: AC
  Administered 2022-11-01: 650 mg via ORAL
  Filled 2022-11-01: qty 2

## 2022-11-01 MED ORDER — INSULIN ASPART 100 UNIT/ML IJ SOLN
0.0000 [IU] | Freq: Three times a day (TID) | INTRAMUSCULAR | Status: DC
  Administered 2022-11-01: 3 [IU] via SUBCUTANEOUS

## 2022-11-01 MED ORDER — ATORVASTATIN CALCIUM 40 MG PO TABS: 40.0000 mg | ORAL_TABLET | Freq: Every evening | ORAL | Status: DC

## 2022-11-01 MED ORDER — EMPAGLIFLOZIN 25 MG PO TABS
25.0000 mg | ORAL_TABLET | Freq: Every day | ORAL | Status: DC
  Administered 2022-11-01: 25 mg via ORAL
  Filled 2022-11-01: qty 1

## 2022-11-01 MED ORDER — LANTUS SOLOSTAR 100 UNIT/ML ~~LOC~~ SOPN: 12.0000 [IU] | PEN_INJECTOR | Freq: Every day | SUBCUTANEOUS | 1 refills | Status: AC

## 2022-11-01 MED ORDER — SEMAGLUTIDE 7 MG PO TABS: 7.0000 mg | ORAL_TABLET | Freq: Every day | ORAL | Status: DC

## 2022-11-01 MED ORDER — INSULIN GLARGINE-YFGN 100 UNIT/ML ~~LOC~~ SOLN
2.0000 [IU] | Freq: Once | SUBCUTANEOUS | Status: AC
  Administered 2022-11-01: 2 [IU] via SUBCUTANEOUS
  Filled 2022-11-01: qty 0.02

## 2022-11-01 MED ORDER — BUPROPION HCL ER (XL) 150 MG PO TB24
150.0000 mg | ORAL_TABLET | Freq: Every day | ORAL | Status: DC
  Administered 2022-11-01: 150 mg via ORAL
  Filled 2022-11-01: qty 1

## 2022-11-01 MED ORDER — "PEN NEEDLES 3/16"" 31G X 5 MM MISC": 1.0000 | Freq: Every day | 0 refills | Status: AC

## 2022-11-01 MED ORDER — ENOXAPARIN SODIUM 40 MG/0.4ML IJ SOSY
40.0000 mg | PREFILLED_SYRINGE | INTRAMUSCULAR | Status: DC
  Administered 2022-11-01: 40 mg via SUBCUTANEOUS
  Filled 2022-11-01: qty 0.4

## 2022-11-01 MED ORDER — TRAMADOL HCL 50 MG PO TABS
50.0000 mg | ORAL_TABLET | Freq: Two times a day (BID) | ORAL | Status: DC | PRN
  Administered 2022-11-01: 50 mg via ORAL
  Filled 2022-11-01: qty 1

## 2022-11-01 MED ORDER — ONDANSETRON HCL 4 MG PO TABS: 4.0000 mg | ORAL_TABLET | Freq: Four times a day (QID) | ORAL | Status: DC | PRN

## 2022-11-01 MED ORDER — ACETAMINOPHEN 325 MG PO TABS: 650.0000 mg | ORAL_TABLET | Freq: Four times a day (QID) | ORAL | Status: DC | PRN

## 2022-11-01 MED ORDER — ACETAMINOPHEN 650 MG RE SUPP: 650.0000 mg | Freq: Four times a day (QID) | RECTAL | Status: DC | PRN

## 2022-11-01 MED ORDER — ONDANSETRON HCL 4 MG/2ML IJ SOLN: 4.0000 mg | Freq: Four times a day (QID) | INTRAMUSCULAR | Status: DC | PRN

## 2022-11-01 MED ORDER — INSULIN ASPART 100 UNIT/ML IJ SOLN: 0.0000 [IU] | Freq: Every day | INTRAMUSCULAR | Status: DC

## 2022-11-01 MED ORDER — INSULIN GLARGINE 100 UNIT/ML ~~LOC~~ SOLN: 10.0000 [IU] | Freq: Every day | SUBCUTANEOUS | 0 refills | Status: DC

## 2022-11-01 NOTE — Consult Note (Deleted)
Initial Consultation Note   Patient: Ariel Meyer ZOX:[id number] DOB: Sep 01, 1950 PCP: Lorenda Ishihara, MD DOA: 10/31/2022 DOS: the patient was seen and examined on 11/01/2022 Primary service: Tegeler, Canary Brim, *  Referring physician: Evlyn Kanner Reason for consult: Uncontrolled DM  Assessment/Plan: Assessment and Plan: * DM (diabetes mellitus), type 2 with renal complications (HCC) Far more impressive than her renal fxn today is her poor DM control.  Likely in setting of recently decreasing metformin dosage due to the renal fxn. Per pt: no GI side effects since starting rebylsus ~1 month ago (has 2 pills left in the 3mg  bottle she thinks). Increase rebylsus to 7mg  daily Cont Jardiance Add Lantus 10u at bedtime for the moment (PCP will need to manage titration). DC metformin.  CKD stage 3b, GFR 30-44 ml/min (HCC) Creat 1.72 in Feb Creat 1.6 in March Creat 1.8 today Dont think she has very severe AKI today as a result given the above creat numbers, 1.8 seems near baseline       TRH will sign off at present, please call us again when needed.  HPI: Marquie Shekema Nieves is a 72 y.o. female with past medical history of DM2, HTN, CKD 3.  Pt recently had metformin dose cut in half last month (due to CKD no doubt), started on rebylsus last month.  No GI side effects from reblysus but only on the 3mg  starter dose still.  Pt in to ED with polyuria, polydipsia, and uncontrolled BGLs at home.  BGLs running 400s.  No N/V diarrhea nor constipation.  Review of Systems: As mentioned in the history of present illness. All other systems reviewed and are negative. Past Medical History:  Diagnosis Date   Anxiety    Arthritis    Back problem    Chronic kidney disease    Complication of anesthesia    wakes up durning surgery   Depression    Diabetes mellitus without complication (HCC)    Dizziness    GERD (gastroesophageal reflux disease)    Hyperlipidemia     Hypertension    Pneumonia    Seasonal allergies    Shortness of breath dyspnea    Sleep apnea    Past Surgical History:  Procedure Laterality Date   BACK SURGERY     KYPHOPLASTY N/A 08/12/2012   Procedure: KYPHOPLASTY;  Surgeon: Clydene Fake, MD;  Location: [facility];  Service: Neurosurgery;  Laterality: N/A;  L1 Balloon Kyphoplasty   NASAL SINUS SURGERY     TONSILLECTOMY     TUBAL LIGATION     Social History:  reports that she quit smoking about 13 years ago. Her smoking use included cigarettes. She has a 22.50 pack-year smoking history. She has never used smokeless tobacco. She reports current alcohol use of about 49.0 standard drinks of alcohol per week. She reports that she does not use drugs.  Allergies  Allergen Reactions   Lisinopril Swelling    angioedema   Sudafed 12 Hour [Pseudoephedrine Hcl Er] Hypertension   Dilaudid [Hydromorphone Hcl] Other (See Comments)    hallucination   Fosamax [Alendronate Sodium] Cough   Levaquin Leva-Pak [Levofloxacin] Other (See Comments)    Tendon pain   Macrodantin [Nitrofurantoin Macrocrystal] Nausea And Vomiting and Other (See Comments)    Passes out    Macrodantin [Nitrofurantoin] Rash   Norvasc [Amlodipine Besylate] Swelling   Penicillins Rash and Other (See Comments)    Inflamation   Sudafed [Pseudoephedrine Hcl] Other (See Comments)    Raises blood pressure  Sulfa Antibiotics Other (See Comments)    Inflames her mucous membranes    Family History  Problem Relation Age of Onset   Heart disease Other    Arthritis Other     Prior to Admission medications   Medication Sig Start Date End Date Taking? Authorizing Provider  acetaminophen (TYLENOL) 500 MG tablet Take 500 mg by mouth every 6 (six) hours as needed for moderate pain.   Yes [provider]  albuterol (VENTOLIN HFA) 108 (90 Base) MCG/ACT inhaler Inhale into the lungs every 6 (six) hours as needed for wheezing or shortness of breath.   Yes [provider]  ARNUITY ELLIPTA 100 MCG/ACT AEPB Inhale 1 puff into the lungs daily as needed (sob).   Yes [provider]  atorvastatin (LIPITOR) 40 MG tablet Take 40 mg by mouth every evening.   Yes [provider]  buPROPion (WELLBUTRIN XL) 150 MG 24 hr tablet Take 150 mg by mouth daily.   Yes [provider]  cholecalciferol (VITAMIN D) 1000 UNITS tablet Take 1,000 Units by mouth daily.   Yes [provider]  docusate sodium (COLACE) 50 MG capsule Take 50 mg by mouth daily as needed for mild constipation.   Yes [provider]  fluticasone (FLONASE) 50 MCG/ACT nasal spray Place 2 sprays into both nostrils daily. Patient taking differently: Place 2 sprays into both nostrils daily as needed for allergies. 02/12/14  Yes Vann, Jessica U, DO  guaiFENesin (MUCINEX) 600 MG 12 hr tablet Take 600 mg by mouth 2 (two) times daily as needed for cough.   Yes [provider]  JARDIANCE 25 MG TABS tablet Take 25 mg by mouth daily. 07/11/22  Yes [provider]  levocetirizine (XYZAL) 5 MG tablet Take 5 mg by mouth every evening.   Yes [provider]  metFORMIN (GLUCOPHAGE-XR) 500 MG 24 hr tablet Take 500 mg by mouth 2 (two) times daily with a meal.   Yes [provider]  Multiple Vitamin (MULTIVITAMIN WITH MINERALS) TABS Take 1 tablet by mouth daily.   Yes [provider]  RYBELSUS 3 MG TABS Take 1 tablet by mouth daily.   Yes [provider]  traMADol (ULTRAM) 50 MG tablet Take 50 mg by mouth 2 (two) times daily as needed for moderate pain or severe pain. 10/19/22  Yes [provider]  triamcinolone cream (KENALOG) 0.1 % Apply 1 application  topically 2 (two) times daily.   Yes [provider]  benzonatate (TESSALON) 100 MG capsule Take 1 capsule (100 mg total) by mouth every 8 (eight) hours. Patient not taking: Reported on 10/31/2022 06/09/22   Particia Nearing, PA-C  famotidine (PEPCID) 10  MG tablet Take 10 mg 2 (two) times daily as needed by mouth for heartburn or indigestion.    [provider]  gabapentin (NEURONTIN) 100 MG capsule Take 100-300 mg at bedtime as needed by mouth (pain). Patient not taking: Reported on 10/17/2021    [provider]  omeprazole (PRILOSEC) 20 MG capsule Take 20 mg daily by mouth. Patient not taking: Reported on 10/17/2021    [provider]    Physical Exam: Vitals:   10/31/22 2145 10/31/22 2200 10/31/22 2305 11/01/22 0000  BP: 126/87  116/71 128/79  Pulse: 92  92 72  Resp: 18  17 16   Temp:  97.8 F (36.6 C)    TempSrc:      SpO2: (!) 88%  95% 95%  Weight:  Height:       Constitutional: NAD, calm, comfortable Respiratory: clear to auscultation bilaterally, no wheezing, no crackles. Normal respiratory effort. No accessory muscle use.  Cardiovascular: Regular rate and rhythm, no murmurs / rubs / gallops. No extremity edema. 2+ pedal pulses. No carotid bruits.  Abdomen: no tenderness, no masses palpated. No hepatosplenomegaly. Bowel sounds positive.  Neurologic: CN 2-12 grossly intact. Sensation intact, DTR normal. Strength 5/5 in all 4.  Psychiatric: Normal judgment and insight. Alert and oriented x 3. Normal mood.   Data Reviewed:     Labs on Admission: I have personally reviewed following labs and imaging studies  CBC: Recent Labs  Lab 10/31/22 1731  WBC 9.6  NEUTROABS 6.6  HGB 13.8  HCT 40.0  MCV 89.7  PLT 233   Basic Metabolic Panel: Recent Labs  Lab 10/31/22 1731  NA 129*  K 3.5  CL 95*  CO2 21*  GLUCOSE 485*  BUN 43*  CREATININE 1.86*  CALCIUM 9.0   GFR: Estimated Creatinine Clearance: 31 mL/min (A) (by C-G formula based on SCr of 1.86 mg/dL (H)). Liver Function Tests: No results for input(s): "AST", "ALT", "ALKPHOS", "BILITOT", "PROT", "ALBUMIN" in the last 168 hours. No results for input(s): "LIPASE", "AMYLASE" in the last 168 hours. No results for input(s): "AMMONIA" in the  last 168 hours. Coagulation Profile: No results for input(s): "INR", "PROTIME" in the last 168 hours. Cardiac Enzymes: No results for input(s): "CKTOTAL", "CKMB", "CKMBINDEX", "TROPONINI" in the last 168 hours. BNP (last 3 results) No results for input(s): "PROBNP" in the last 8760 hours. HbA1C: No results for input(s): "HGBA1C" in the last 72 hours. CBG: Recent Labs  Lab 10/31/22 1717 10/31/22 1913 10/31/22 2156  GLUCAP 428* 417* 301*   Lipid Profile: No results for input(s): "CHOL", "HDL", "LDLCALC", "TRIG", "CHOLHDL", "LDLDIRECT" in the last 72 hours. Thyroid Function Tests: No results for input(s): "TSH", "T4TOTAL", "FREET4", "T3FREE", "THYROIDAB" in the last 72 hours. Anemia Panel: No results for input(s): "VITAMINB12", "FOLATE", "FERRITIN", "TIBC", "IRON", "RETICCTPCT" in the last 72 hours. Urine analysis:    Component Value Date/Time   COLORURINE STRAW (A) 10/31/2022 1747   APPEARANCEUR CLEAR 10/31/2022 1747   LABSPEC 1.021 10/31/2022 1747   PHURINE 5.0 10/31/2022 1747   GLUCOSEU >=500 (A) 10/31/2022 1747   HGBUR NEGATIVE 10/31/2022 1747   BILIRUBINUR NEGATIVE 10/31/2022 1747   KETONESUR NEGATIVE 10/31/2022 1747   PROTEINUR NEGATIVE 10/31/2022 1747   UROBILINOGEN 0.2 02/07/2014 1410   NITRITE NEGATIVE 10/31/2022 1747   LEUKOCYTESUR NEGATIVE 10/31/2022 1747    Radiological Exams on Admission: No results found.  EKG: Independently reviewed.     Family Communication: No family in room Primary team communication: d/w Berle Mull Thank you very much for involving [place] in the care of your patient.  Author: Hillary Bow., DO 11/01/2022 12:59 AM  For on call review [email or web address].

## 2022-11-01 NOTE — Assessment & Plan Note (Signed)
Far more impressive than her renal fxn today is her poor DM control.  Likely in setting of recently decreasing metformin dosage due to the renal fxn. Per pt: no GI side effects since starting rebylsus ~1 month ago (has 2 pills left in the 3mg  bottle she thinks). Increase rebylsus to 7mg  daily Cont Jardiance Add Lantus 10u at bedtime for the moment (PCP will need to manage titration). DC metformin.

## 2022-11-01 NOTE — TOC Benefit Eligibility Note (Signed)
Pharmacy Patient Advocate Encounter  Insurance verification completed.    The patient is insured through [employer] test claim for [employer] and the current 30 day co-pay is $27.48.   This test claim was processed through [facility]- copay amounts may vary at other pharmacies due to pharmacy/plan contracts, or as the patient moves through the different stages of their insurance plan.

## 2022-11-01 NOTE — Discharge Instructions (Addendum)
Follow with Ariel Ishihara, MD in 5-7 days  Please get a complete blood count and chemistry panel checked by your Primary MD at your next visit, and again as instructed by your Primary MD. Please get your medications reviewed and adjusted by your Primary MD.  Please request your Primary MD to go over all Hospital Tests and Procedure/Radiological results at the follow up, please get all Hospital records sent to your Prim MD by signing hospital release before you go home.  In some cases, there will be blood work, cultures and biopsy results pending at the time of your discharge. Please request that your primary care M.D. goes through all the records of your hospital data and follows up on these results.  If you had Pneumonia of Lung problems at the Hospital: Please get a 2 view Chest X ray done in 6-8 weeks after hospital discharge or sooner if instructed by your Primary MD.  If you have Congestive Heart Failure: Please call your Cardiologist or Primary MD anytime you have any of the following symptoms:  1) 3 pound weight gain in 24 hours or 5 pounds in 1 week  2) shortness of breath, with or without a dry hacking cough  3) swelling in the hands, feet or stomach  4) if you have to sleep on extra pillows at night in order to breathe  Follow cardiac low salt diet and 1.5 lit/day fluid restriction.  If you have diabetes Accuchecks 4 times/day, Once in AM empty stomach and then before each meal. Log in all results and show them to your primary doctor at your next visit. If any glucose reading is under 80 or above 300 call your primary MD immediately.  If you have Seizure/Convulsions/Epilepsy: Please do not drive, operate heavy machinery, participate in activities at heights or participate in high speed sports until you have seen by Primary MD or a Neurologist and advised to do so again. Per [facility] statutes, patients with seizures are not allowed to drive until they have been  seizure-free for six months.  Use caution when using heavy equipment or power tools. Avoid working on ladders or at heights. Take showers instead of baths. Ensure the water temperature is not too high on the home water heater. Do not go swimming alone. Do not lock yourself in a room alone (i.e. bathroom). When caring for infants or small children, sit down when holding, feeding, or changing them to minimize risk of injury to the child in the event you have a seizure. Maintain good sleep hygiene. Avoid alcohol.   If you had Gastrointestinal Bleeding: Please ask your Primary MD to check a complete blood count within one week of discharge or at your next visit. Your endoscopic/colonoscopic biopsies that are pending at the time of discharge, will also need to followed by your Primary MD.  Get Medicines reviewed and adjusted. Please take all your medications with you for your next visit with your Primary MD  Please request your Primary MD to go over all hospital tests and procedure/radiological results at the follow up, please ask your Primary MD to get all Hospital records sent to his/her office.  If you experience worsening of your admission symptoms, develop shortness of breath, life threatening emergency, suicidal or homicidal thoughts you must seek medical attention immediately by calling 911 or calling your MD immediately  if symptoms less severe.  You must read complete instructions/literature along with all the possible adverse reactions/side effects for all the Medicines you take  and that have been prescribed to you. Take any new Medicines after you have completely understood and accpet all the possible adverse reactions/side effects.   Do not drive or operate heavy machinery when taking Pain medications.   Do not take more than prescribed Pain, Sleep and Anxiety Medications  Special Instructions: If you have smoked or chewed Tobacco  in the last 2 yrs please stop smoking, stop any regular  Alcohol  and or any Recreational drug use.  Wear Seat belts while driving.  Please note You were cared for by a hospitalist during your hospital stay. If you have any questions about your discharge medications or the care you received while you were in the hospital after you are discharged, you can call the unit and asked to speak with the hospitalist on call if the hospitalist that took care of you is not available. Once you are discharged, your primary care physician will handle any further medical issues. Please note that NO REFILLS for any discharge medications will be authorized once you are discharged, as it is imperative that you return to your primary care physician (or establish a relationship with a primary care physician if you do not have one) for your aftercare needs so that they can reassess your need for medications and monitor your lab values.  You can reach the hospitalist office at phone [phone] or fax [phone]   If you do not have a primary care physician, you can call [phone] for a physician referral.  Activity: As tolerated with Full fall precautions use walker/cane & assistance as needed    Diet: diabetic  Disposition Home

## 2022-11-01 NOTE — Assessment & Plan Note (Signed)
Creat 1.72 in Feb Creat 1.6 in March Creat 1.8 today Dont think she has very severe AKI today as a result given the above creat numbers, 1.8 seems near baseline

## 2022-11-01 NOTE — TOC CM/SW Note (Signed)
Transition of Care [facility]) - Inpatient Brief Assessment   Patient Details  Name: Ariel Meyer MRN: [id number] Date of Birth: [birth year]  Transition of [facility] CM/SW Contact:    Otelia Santee, LCSW Phone Number: 11/01/2022, 12:19 PM   Clinical Narrative: Chart reviewed. No TOC needs identified.     Transition of Care Asessment: Insurance and Status: Insurance coverage has been reviewed Patient has primary care physician: Yes Home environment has been reviewed: Home w/ spouse Prior level of function:: Independent Prior/Current Home Services: No current home services Social Determinants of Health Reivew: SDOH reviewed no interventions necessary Readmission risk has been reviewed: Yes Transition of care needs: no transition of care needs at this time

## 2022-11-01 NOTE — ED Notes (Signed)
Pt reports still not feeling well, MD made aware. Pt reports she is too weak to go home at this time.

## 2022-11-01 NOTE — Discharge Summary (Signed)
Physician Discharge Summary  Ariel Meyer ZOX:[id number] DOB: Nov 15, 1950 DOA: 10/31/2022  PCP: Lorenda Ishihara, MD  Admit date: 10/31/2022 Discharge date: 11/01/2022  Admitted From: home Disposition:  home  Recommendations for Outpatient Follow-up:  Follow up with PCP in 1-2 weeks  Home Health: none Equipment/Devices: none  Discharge Condition: stable CODE STATUS: Full code Diet Orders (From admission, onward)     Start     Ordered   11/01/22 0206  Diet Carb Modified Fluid consistency: Thin; Room service appropriate? Yes  Diet effective now       Question Answer Comment  Diet-HS Snack? Nothing   Calorie Level Medium 1600-2000   Fluid consistency: Thin   Room service appropriate? Yes      11/01/22 0205            HPI: Per admitting MD, Ariel Meyer is a 72 y.o. female with past medical history of DM2, HTN, CKD 3. Pt recently had metformin dose cut in half last month (due to CKD no doubt), started on rebylsus last month.  No GI side effects from reblysus but only on the 3mg  starter dose still. Pt in to ED with polyuria, polydipsia, and uncontrolled BGLs at home. BGLs running 400s. No N/V diarrhea nor constipation.  Hospital Course / Discharge diagnoses: Principal Problem:   DM (diabetes mellitus), type 2 with renal complications (HCC) Active Problems:   CKD stage 3b, GFR 30-44 ml/min (HCC)   Hyperglycemia  Principal problem Poorly controlled DM2, with hyperglycemia and renal complications -patient was admitted to the hospital with persistently elevated CBGs at home, increased thirst and overall weakness.  She was placed on long-acting insulin and was given IV fluids with significant improvement in her symptoms and now is feeling back to baseline.  Diabetes coordinator was consulted and has discussed with the patient.  She will be given Lantus, she is stable and will be discharged home in stable condition  Active problems CKD 3B -creatinine  stable at her baseline Hyponatremia -pseudohyponatremia in the setting of elevated CBGs Hypokalemia-replaced prior to discharge  Sepsis ruled out   Discharge Instructions   Allergies as of 11/01/2022       Reactions   Lisinopril Swelling   angioedema   Sudafed 12 Hour [pseudoephedrine Hcl Er] Hypertension   Dilaudid [hydromorphone Hcl] Other (See Comments)   hallucination   Fosamax [alendronate Sodium] Cough   Levaquin Leva-pak [levofloxacin] Other (See Comments)   Tendon pain   Macrodantin [nitrofurantoin Macrocrystal] Nausea And Vomiting, Other (See Comments)   Passes out    Macrodantin [nitrofurantoin] Rash   Norvasc [amlodipine Besylate] Swelling   Penicillins Rash, Other (See Comments)   Inflamation   Sudafed [pseudoephedrine Hcl] Other (See Comments)   Raises blood pressure   Sulfa Antibiotics Other (See Comments)   Inflames her mucous membranes        Medication List     STOP taking these medications    metFORMIN 500 MG 24 hr tablet Commonly known as: GLUCOPHAGE-XR       TAKE these medications    acetaminophen 500 MG tablet Commonly known as: TYLENOL Take 500 mg by mouth every 6 (six) hours as needed for moderate pain.   albuterol 108 (90 Base) MCG/ACT inhaler Commonly known as: VENTOLIN HFA Inhale into the lungs every 6 (six) hours as needed for wheezing or shortness of breath.   Arnuity Ellipta 100 MCG/ACT Aepb Generic drug: Fluticasone Furoate Inhale 1 puff into the lungs daily as needed (sob).   atorvastatin  40 MG tablet Commonly known as: LIPITOR Take 40 mg by mouth every evening.   benzonatate 100 MG capsule Commonly known as: TESSALON Take 1 capsule (100 mg total) by mouth every 8 (eight) hours.   buPROPion 150 MG 24 hr tablet Commonly known as: WELLBUTRIN XL Take 150 mg by mouth daily.   cholecalciferol 1000 units tablet Commonly known as: VITAMIN D Take 1,000 Units by mouth daily.   docusate sodium 50 MG capsule Commonly  known as: COLACE Take 50 mg by mouth daily as needed for mild constipation.   fluticasone 50 MCG/ACT nasal spray Commonly known as: FLONASE Place 2 sprays into both nostrils daily. What changed:  when to take this reasons to take this   guaiFENesin 600 MG 12 hr tablet Commonly known as: MUCINEX Take 600 mg by mouth 2 (two) times daily as needed for cough.   Jardiance 25 MG Tabs tablet Generic drug: empagliflozin Take 25 mg by mouth daily.   Lantus SoloStar 100 UNIT/ML Solostar Pen Generic drug: insulin glargine Inject 12 Units into the skin at bedtime.   levocetirizine 5 MG tablet Commonly known as: XYZAL Take 5 mg by mouth every evening.   multivitamin with minerals Tabs tablet Take 1 tablet by mouth daily.   Pen Needles 3/16" 31G X 5 MM Misc 1 each by Does not apply route daily.   Rybelsus 7 MG Tabs Generic drug: Semaglutide Take 1 tablet (7 mg total) by mouth daily. What changed:  medication strength how much to take   traMADol 50 MG tablet Commonly known as: ULTRAM Take 50 mg by mouth 2 (two) times daily as needed for moderate pain or severe pain.   triamcinolone cream 0.1 % Commonly known as: KENALOG Apply 1 application  topically 2 (two) times daily.        Follow-up Information     Lorenda Ishihara, MD Follow up in 2 week(s).   Specialty: Internal Medicine Contact information: [address] [place] [address] [phone]                Consultations: none  Procedures/Studies:  US RENAL  Result Date: 2022/10/12 CLINICAL DATA:  Chronic renal disease, stage III. EXAM: RENAL / URINARY TRACT ULTRASOUND COMPLETE COMPARISON:  CT abdomen pelvis 11/06/2017 FINDINGS: Right Kidney: Renal measurements: 9.9 x 4.0 x 5.2 cm = volume: 108.3 mL. Echogenicity within normal limits. No mass or hydronephrosis visualized. Left Kidney: Renal measurements: 10.0 x 4.0 x 4.3 cm = volume: 90.8 mL. Echogenicity within normal limits. No mass  or hydronephrosis visualized. Bladder: Appears normal for degree of bladder distention. Other: None. IMPRESSION: No hydronephrosis. Electronically Signed   By: Annia Belt M.D.   On: Oct 12, 2022 16:12     Subjective: - no chest pain, shortness of breath, no abdominal pain, nausea or vomiting.   Discharge Exam: BP 129/79 (BP Location: Right Arm)   Pulse 81   Temp 97.9 F (36.6 C) (Oral)   Resp 15   Ht 5\' 8"  (1.727 m)   Wt 86.2 kg   SpO2 98%   BMI 28.89 kg/m   General: Pt is alert, awake, not in acute distress Cardiovascular: RRR, S1/S2 +, no rubs, no gallops Respiratory: CTA bilaterally, no wheezing, no rhonchi Abdominal: Soft, NT, ND, bowel sounds + Extremities: no edema, no cyanosis   The results of significant diagnostics from this hospitalization (including imaging, microbiology, ancillary and laboratory) are listed below for reference.     Microbiology: No results found for this  or any previous visit (from the past 240 hour(s)).   Labs: Basic Metabolic Panel: Recent Labs  Lab 10/31/22 1731 11/01/22 0526  NA 129* 133*  K 3.5 3.4*  CL 95* 103  CO2 21* 21*  GLUCOSE 485* 208*  BUN 43* 35*  CREATININE 1.86* 1.55*  CALCIUM 9.0 9.0   Liver Function Tests: No results for input(s): "AST", "ALT", "ALKPHOS", "BILITOT", "PROT", "ALBUMIN" in the last 168 hours. CBC: Recent Labs  Lab 10/31/22 1731  WBC 9.6  NEUTROABS 6.6  HGB 13.8  HCT 40.0  MCV 89.7  PLT 233   CBG: Recent Labs  Lab 10/31/22 2156 11/01/22 0125 11/01/22 0307 11/01/22 0729 11/01/22 1206  GLUCAP 301* 240* 205* 209* 244*   Hgb A1c No results for input(s): "HGBA1C" in the last 72 hours. Lipid Profile No results for input(s): "CHOL", "HDL", "LDLCALC", "TRIG", "CHOLHDL", "LDLDIRECT" in the last 72 hours. Thyroid function studies No results for input(s): "TSH", "T4TOTAL", "T3FREE", "THYROIDAB" in the last 72 hours.  Invalid input(s): "FREET3" Urinalysis    Component Value Date/Time    COLORURINE STRAW (A) 10/31/2022 1747   APPEARANCEUR CLEAR 10/31/2022 1747   LABSPEC 1.021 10/31/2022 1747   PHURINE 5.0 10/31/2022 1747   GLUCOSEU >=500 (A) 10/31/2022 1747   HGBUR NEGATIVE 10/31/2022 1747   BILIRUBINUR NEGATIVE 10/31/2022 1747   KETONESUR NEGATIVE 10/31/2022 1747   PROTEINUR NEGATIVE 10/31/2022 1747   UROBILINOGEN 0.2 02/07/2014 1410   NITRITE NEGATIVE 10/31/2022 1747   LEUKOCYTESUR NEGATIVE 10/31/2022 1747    FURTHER DISCHARGE INSTRUCTIONS:   Get Medicines reviewed and adjusted: Please take all your medications with you for your next visit with your Primary MD   Laboratory/radiological data: Please request your Primary MD to go over all hospital tests and procedure/radiological results at the follow up, please ask your Primary MD to get all Hospital records sent to his/her office.   In some cases, they will be blood work, cultures and biopsy results pending at the time of your discharge. Please request that your primary care M.D. goes through all the records of your hospital data and follows up on these results.   Also Note the following: If you experience worsening of your admission symptoms, develop shortness of breath, life threatening emergency, suicidal or homicidal thoughts you must seek medical attention immediately by calling 911 or calling your MD immediately  if symptoms less severe.   You must read complete instructions/literature along with all the possible adverse reactions/side effects for all the Medicines you take and that have been prescribed to you. Take any new Medicines after you have completely understood and accpet all the possible adverse reactions/side effects.    Do not drive when taking Pain medications or sleeping medications (Benzodaizepines)   Do not take more than prescribed Pain, Sleep and Anxiety Medications. It is not advisable to combine anxiety,sleep and pain medications without talking with your primary care practitioner    Special Instructions: If you have smoked or chewed Tobacco  in the last 2 yrs please stop smoking, stop any regular Alcohol  and or any Recreational drug use.   Wear Seat belts while driving.   Please note: You were cared for by a hospitalist during your hospital stay. Once you are discharged, your primary care physician will handle any further medical issues. Please note that NO REFILLS for any discharge medications will be authorized once you are discharged, as it is imperative that you return to your primary care physician (or establish a relationship with a primary  care physician if you do not have one) for your post hospital discharge needs so that they can reassess your need for medications and monitor your lab values.  Time coordinating discharge: 35 minutes  SIGNED:  Pamella Pert, MD, PhD 11/01/2022, 12:13 PM

## 2022-11-01 NOTE — Inpatient Diabetes Management (Signed)
Inpatient Diabetes Program Recommendations  AACE/ADA: New Consensus Statement on Inpatient Glycemic Control (2015)  Target Ranges:  Prepandial:   less than 140 mg/dL      Peak postprandial:   less than 180 mg/dL (1-2 hours)      Critically ill patients:  140 - 180 mg/dL   Lab Results  Component Value Date   GLUCAP 209 (H) 11/01/2022   Review of Glycemic Control  Latest Reference Range & Units 10/31/22 17:17 10/31/22 19:13 10/31/22 21:56 11/01/22 01:25 11/01/22 03:07 11/01/22 07:29  Glucose-Capillary 70 - 99 mg/dL 161 (H) 096 (H) 045 (H) 240 (H) 205 (H) 209 (H)  (H): Data is abnormally high  Diabetes history: DM2 Outpatient Diabetes medications: Jardiance 25 mg every day, Rybelsus 3 mg every day, Metformin 500 mg BID Current orders for Inpatient glycemic control: Semglee 10 units at bedtime, Jardiance 25 mg every day, Semaglutide 7 mg (not on formulary)   Discharge Recommendations: Long acting recommendations: Insulin Glargine (LANTUS) Solostar Pen 12 units QHS (0.15 units/kg)    Spoke with patient at bedside.  Her Metformin was recently decreased from 1000 mg BID to 500 mg BID.  She was started on Jardiance in March.  She has been thirsty, has had increased urination and has felt unwell for several weeks.  She noticed her glucose was increasing as well.    Spoke with Dr. Elvera Lennox; he would like to DC her on basal insulin.  Educated patient on insulin pen use at home. Reviewed contents of insulin flexpen starter kit. Reviewed all steps of insulin pen including attachment of needle, 2-unit air shot, dialing up dose, giving injection, removing needle, disposal of sharps, storage of unused insulin, disposal of insulin etc. Patient able to provide successful return demonstration. Also reviewed troubleshooting with insulin pen. MD to give patient Rxs for insulin pens and insulin pen needles.  We discussed signs, symptoms and treatments of hypoglycemia.  She is interested in a CGM; however she  does not have a smart phone.  She has an appointment with her PCP in the next 1-2 weeks.  Asked her to discuss this with her PCP; she will need a reader for the CGM.    Discussed Jardiance at length and possiblity of DKA with the use of SGLT-2i's.  If she continues to have increased thirst & N/V, she should contact her PCP.  Hopefully this will not be an issue with adding insulin to her regimen.    Attached education to her exit care.    Will continue to follow while inpatient.  Thank you, Dulce Sellar, MSN, CDCES Diabetes Coordinator Inpatient Diabetes Program [phone] (team pager from 8a-5p)

## 2022-11-01 NOTE — H&P (Signed)
Initial Consultation Note   Patient: Ariel Meyer ZHY:[id number] DOB: 08/17/1950 PCP: Lorenda Ishihara, MD DOA: 10/31/2022 DOS: the patient was seen and examined on 11/01/2022 Primary service: Tegeler, Canary Brim, *  Referring physician: Evlyn Kanner Reason for consult: Uncontrolled DM  Assessment/Plan: Assessment and Plan: * DM (diabetes mellitus), type 2 with renal complications (HCC) Far more impressive than her renal fxn today is her poor DM control.  Likely in setting of recently decreasing metformin dosage due to the renal fxn. Per pt: no GI side effects since starting rebylsus ~1 month ago (has 2 pills left in the 3mg  bottle she thinks). Increase rebylsus to 7mg  daily Cont Jardiance Add Lantus 10u at bedtime for the moment (PCP will need to manage titration). DC metformin.  CKD stage 3b, GFR 30-44 ml/min (HCC) Creat 1.72 in Feb Creat 1.6 in March Creat 1.8 today Dont think she has very severe AKI today as a result given the above creat numbers, 1.8 seems near baseline        HPI: Ariel Meyer is a 72 y.o. female with past medical history of DM2, HTN, CKD 3.  Pt recently had metformin dose cut in half last month (due to CKD no doubt), started on rebylsus last month.  No GI side effects from reblysus but only on the 3mg  starter dose still.  Pt in to ED with polyuria, polydipsia, and uncontrolled BGLs at home.  BGLs running 400s.  No N/V diarrhea nor constipation.  Review of Systems: As mentioned in the history of present illness. All other systems reviewed and are negative. Past Medical History:  Diagnosis Date   Anxiety    Arthritis    Back problem    Chronic kidney disease    Complication of anesthesia    wakes up durning surgery   Depression    Diabetes mellitus without complication (HCC)    Dizziness    GERD (gastroesophageal reflux disease)    Hyperlipidemia    Hypertension    Pneumonia    Seasonal allergies     Shortness of breath dyspnea    Sleep apnea    Past Surgical History:  Procedure Laterality Date   BACK SURGERY     KYPHOPLASTY N/A 08/12/2012   Procedure: KYPHOPLASTY;  Surgeon: Clydene Fake, MD;  Location: [facility];  Service: Neurosurgery;  Laterality: N/A;  L1 Balloon Kyphoplasty   NASAL SINUS SURGERY     TONSILLECTOMY     TUBAL LIGATION     Social History:  reports that she quit smoking about 13 years ago. Her smoking use included cigarettes. She has a 22.50 pack-year smoking history. She has never used smokeless tobacco. She reports current alcohol use of about 49.0 standard drinks of alcohol per week. She reports that she does not use drugs.  Allergies  Allergen Reactions   Lisinopril Swelling    angioedema   Sudafed 12 Hour [Pseudoephedrine Hcl Er] Hypertension   Dilaudid [Hydromorphone Hcl] Other (See Comments)    hallucination   Fosamax [Alendronate Sodium] Cough   Levaquin Leva-Pak [Levofloxacin] Other (See Comments)    Tendon pain   Macrodantin [Nitrofurantoin Macrocrystal] Nausea And Vomiting and Other (See Comments)    Passes out    Macrodantin [Nitrofurantoin] Rash   Norvasc [Amlodipine Besylate] Swelling   Penicillins Rash and Other (See Comments)    Inflamation   Sudafed [Pseudoephedrine Hcl] Other (See Comments)    Raises blood pressure   Sulfa Antibiotics Other (See Comments)    Inflames her mucous  membranes    Family History  Problem Relation Age of Onset   Heart disease Other    Arthritis Other     Prior to Admission medications   Medication Sig Start Date End Date Taking? Authorizing Provider  acetaminophen (TYLENOL) 500 MG tablet Take 500 mg by mouth every 6 (six) hours as needed for moderate pain.   Yes [provider]  albuterol (VENTOLIN HFA) 108 (90 Base) MCG/ACT inhaler Inhale into the lungs every 6 (six) hours as needed for wheezing or shortness of breath.   Yes [provider]  ARNUITY ELLIPTA 100 MCG/ACT AEPB Inhale 1  puff into the lungs daily as needed (sob).   Yes [provider]  atorvastatin (LIPITOR) 40 MG tablet Take 40 mg by mouth every evening.   Yes [provider]  buPROPion (WELLBUTRIN XL) 150 MG 24 hr tablet Take 150 mg by mouth daily.   Yes [provider]  cholecalciferol (VITAMIN D) 1000 UNITS tablet Take 1,000 Units by mouth daily.   Yes [provider]  docusate sodium (COLACE) 50 MG capsule Take 50 mg by mouth daily as needed for mild constipation.   Yes [provider]  fluticasone (FLONASE) 50 MCG/ACT nasal spray Place 2 sprays into both nostrils daily. Patient taking differently: Place 2 sprays into both nostrils daily as needed for allergies. 02/12/14  Yes Vann, Jessica U, DO  guaiFENesin (MUCINEX) 600 MG 12 hr tablet Take 600 mg by mouth 2 (two) times daily as needed for cough.   Yes [provider]  JARDIANCE 25 MG TABS tablet Take 25 mg by mouth daily. 07/11/22  Yes [provider]  levocetirizine (XYZAL) 5 MG tablet Take 5 mg by mouth every evening.   Yes [provider]  metFORMIN (GLUCOPHAGE-XR) 500 MG 24 hr tablet Take 500 mg by mouth 2 (two) times daily with a meal.   Yes [provider]  Multiple Vitamin (MULTIVITAMIN WITH MINERALS) TABS Take 1 tablet by mouth daily.   Yes [provider]  RYBELSUS 3 MG TABS Take 1 tablet by mouth daily.   Yes [provider]  traMADol (ULTRAM) 50 MG tablet Take 50 mg by mouth 2 (two) times daily as needed for moderate pain or severe pain. 10/19/22  Yes [provider]  triamcinolone cream (KENALOG) 0.1 % Apply 1 application  topically 2 (two) times daily.   Yes [provider]  benzonatate (TESSALON) 100 MG capsule Take 1 capsule (100 mg total) by mouth every 8 (eight) hours. Patient not taking: Reported on 10/31/2022 06/09/22   Particia Nearing, PA-C  famotidine (PEPCID) 10 MG tablet Take 10 mg 2 (two) times daily as needed by mouth  for heartburn or indigestion.    [provider]  gabapentin (NEURONTIN) 100 MG capsule Take 100-300 mg at bedtime as needed by mouth (pain). Patient not taking: Reported on 10/17/2021    [provider]  omeprazole (PRILOSEC) 20 MG capsule Take 20 mg daily by mouth. Patient not taking: Reported on 10/17/2021    [provider]    Physical Exam: Vitals:   10/31/22 2305 11/01/22 0000 11/01/22 0100 11/01/22 0155  BP: 116/71 128/79 (!) 97/56 104/63  Pulse: 92 72 71 68  Resp: 17 16 15 15   Temp:   97.8 F (36.6 C)   TempSrc:      SpO2: 95% 95% 94% 92%  Weight:      Height:       Constitutional: NAD, calm, comfortable  Respiratory: clear to auscultation bilaterally, no wheezing, no crackles. Normal respiratory effort. No accessory muscle use.  Cardiovascular: Regular rate and rhythm, no murmurs / rubs / gallops. No extremity edema. 2+ pedal pulses. No carotid bruits.  Abdomen: no tenderness, no masses palpated. No hepatosplenomegaly. Bowel sounds positive.  Neurologic: CN 2-12 grossly intact. Sensation intact, DTR normal. Strength 5/5 in all 4.  Psychiatric: Normal judgment and insight. Alert and oriented x 3. Normal mood.   Data Reviewed:     Labs on Admission: I have personally reviewed following labs and imaging studies  CBC: Recent Labs  Lab 10/31/22 1731  WBC 9.6  NEUTROABS 6.6  HGB 13.8  HCT 40.0  MCV 89.7  PLT 233    Basic Metabolic Panel: Recent Labs  Lab 10/31/22 1731  NA 129*  K 3.5  CL 95*  CO2 21*  GLUCOSE 485*  BUN 43*  CREATININE 1.86*  CALCIUM 9.0    GFR: Estimated Creatinine Clearance: 31 mL/min (A) (by C-G formula based on SCr of 1.86 mg/dL (H)). Liver Function Tests: No results for input(s): "AST", "ALT", "ALKPHOS", "BILITOT", "PROT", "ALBUMIN" in the last 168 hours. No results for input(s): "LIPASE", "AMYLASE" in the last 168 hours. No results for input(s): "AMMONIA" in the last 168 hours. Coagulation Profile: No  results for input(s): "INR", "PROTIME" in the last 168 hours. Cardiac Enzymes: No results for input(s): "CKTOTAL", "CKMB", "CKMBINDEX", "TROPONINI" in the last 168 hours. BNP (last 3 results) No results for input(s): "PROBNP" in the last 8760 hours. HbA1C: No results for input(s): "HGBA1C" in the last 72 hours. CBG: Recent Labs  Lab 10/31/22 1717 10/31/22 1913 10/31/22 2156 11/01/22 0125  GLUCAP 428* 417* 301* 240*    Lipid Profile: No results for input(s): "CHOL", "HDL", "LDLCALC", "TRIG", "CHOLHDL", "LDLDIRECT" in the last 72 hours. Thyroid Function Tests: No results for input(s): "TSH", "T4TOTAL", "FREET4", "T3FREE", "THYROIDAB" in the last 72 hours. Anemia Panel: No results for input(s): "VITAMINB12", "FOLATE", "FERRITIN", "TIBC", "IRON", "RETICCTPCT" in the last 72 hours. Urine analysis:    Component Value Date/Time   COLORURINE STRAW (A) 10/31/2022 1747   APPEARANCEUR CLEAR 10/31/2022 1747   LABSPEC 1.021 10/31/2022 1747   PHURINE 5.0 10/31/2022 1747   GLUCOSEU >=500 (A) 10/31/2022 1747   HGBUR NEGATIVE 10/31/2022 1747   BILIRUBINUR NEGATIVE 10/31/2022 1747   KETONESUR NEGATIVE 10/31/2022 1747   PROTEINUR NEGATIVE 10/31/2022 1747   UROBILINOGEN 0.2 02/07/2014 1410   NITRITE NEGATIVE 10/31/2022 1747   LEUKOCYTESUR NEGATIVE 10/31/2022 1747    Radiological Exams on Admission: No results found.  EKG: Independently reviewed.     Family Communication: No family in room Primary team communication: d/w Berle Mull Thank you very much for involving [place] in the care of your patient.  Author: Hillary Bow., DO 11/01/2022 1:57 AM  For on call review [email or web address].

## 2022-11-01 NOTE — ED Notes (Signed)
.ED TO INPATIENT HANDOFF REPORT  Name/Age/Gender Ariel Meyer 72 y.o. female  Code Status    Code Status Orders  (From admission, onward)           Start     Ordered   11/01/22 0206  Full code  Continuous       Question:  By:  Answer:  Consent: discussion documented in EHR   11/01/22 0205           Code Status History     Date Active Date Inactive Code Status Order ID Comments User Context   05/06/2014 1815 05/13/2014 1759 Full Code [id number]  Wilson Singer, MD ED   02/07/2014 1730 02/12/2014 1958 Full Code [id number]  Christiane Ha, MD Inpatient      Advance Directive Documentation    Flowsheet Row Most Recent Value  Type of Advance Directive Living will, Healthcare Power of Attorney  Pre-existing out of facility DNR order (yellow form or pink MOST form) --  "MOST" Form in Place? --       Home/SNF/Other Home  Chief Complaint Hyperglycemia [R73.9]  Level of Care/Admitting Diagnosis ED Disposition     ED Disposition  Admit   Condition  --   Comment  Hospital Area: [facility] [[id number]]  Level of Care: Med-Surg [16]  May place patient in observation at [facility] or [facility] if equivalent level of care is available:: No  Covid Evaluation: Asymptomatic - no recent exposure (last 10 days) testing not required  Diagnosis: Hyperglycemia [086578]  Admitting Physician: Hillary Bow [[id number]]  Attending Physician: Hillary Bow [phone]          Medical History Past Medical History:  Diagnosis Date   Anxiety    Arthritis    Back problem    Chronic kidney disease    Complication of anesthesia    wakes up durning surgery   Depression    Diabetes mellitus without complication (HCC)    Dizziness    GERD (gastroesophageal reflux disease)    Hyperlipidemia    Hypertension    Pneumonia    Seasonal allergies    Shortness of breath dyspnea    Sleep apnea     Allergies Allergies  Allergen Reactions    Lisinopril Swelling    angioedema   Sudafed 12 Hour [Pseudoephedrine Hcl Er] Hypertension   Dilaudid [Hydromorphone Hcl] Other (See Comments)    hallucination   Fosamax [Alendronate Sodium] Cough   Levaquin Leva-Pak [Levofloxacin] Other (See Comments)    Tendon pain   Macrodantin [Nitrofurantoin Macrocrystal] Nausea And Vomiting and Other (See Comments)    Passes out    Macrodantin [Nitrofurantoin] Rash   Norvasc [Amlodipine Besylate] Swelling   Penicillins Rash and Other (See Comments)    Inflamation   Sudafed [Pseudoephedrine Hcl] Other (See Comments)    Raises blood pressure   Sulfa Antibiotics Other (See Comments)    Inflames her mucous membranes    IV Location/Drains/Wounds Patient Lines/Drains/Airways Status     Active Line/Drains/Airways     Name Placement date Placement time Site Days   Peripheral IV 10/31/22 18 G Anterior;Left Forearm 10/31/22  1716  Forearm  1   Peripheral IV 10/31/22 20 G 1.25" Posterior;Right Forearm 10/31/22  1745  Forearm  1            Labs/Imaging Results for orders placed or performed during the hospital encounter of 10/31/22 (from the past 48 hour(s))  POC CBG, ED  Status: Abnormal   Collection Time: 10/31/22  5:17 PM  Result Value Ref Range   Glucose-Capillary 428 (H) 70 - 99 mg/dL    Comment: Glucose reference range applies only to samples taken after fasting for at least 8 hours.  Basic metabolic panel     Status: Abnormal   Collection Time: 10/31/22  5:31 PM  Result Value Ref Range   Sodium 129 (L) 135 - 145 mmol/L   Potassium 3.5 3.5 - 5.1 mmol/L   Chloride 95 (L) 98 - 111 mmol/L   CO2 21 (L) 22 - 32 mmol/L   Glucose, Bld 485 (H) 70 - 99 mg/dL    Comment: Glucose reference range applies only to samples taken after fasting for at least 8 hours.   BUN 43 (H) 8 - 23 mg/dL   Creatinine, Ser 1.61 (H) 0.44 - 1.00 mg/dL   Calcium 9.0 8.9 - 09.6 mg/dL   GFR, Estimated 28 (L) >60 mL/min    Comment: (NOTE) Calculated using the  CKD-EPI Creatinine Equation (2021)    Anion gap 13 5 - 15    Comment: Performed at [facility], [address] [place], [place] [address]  CBC with Differential (PNL)     Status: None   Collection Time: 10/31/22  5:31 PM  Result Value Ref Range   WBC 9.6 4.0 - 10.5 K/uL   RBC 4.46 3.87 - 5.11 MIL/uL   Hemoglobin 13.8 12.0 - 15.0 g/dL   HCT 98.1 19.1 - 47.8 %   MCV 89.7 80.0 - 100.0 fL   MCH 30.9 26.0 - 34.0 pg   MCHC 34.5 30.0 - 36.0 g/dL   RDW 29.5 62.1 - 30.8 %   Platelets 233 150 - 400 K/uL   nRBC 0.0 0.0 - 0.2 %   Neutrophils Relative % 70 %   Neutro Abs 6.6 1.7 - 7.7 K/uL   Lymphocytes Relative 23 %   Lymphs Abs 2.2 0.7 - 4.0 K/uL   Monocytes Relative 6 %   Monocytes Absolute 0.6 0.1 - 1.0 K/uL   Eosinophils Relative 1 %   Eosinophils Absolute 0.1 0.0 - 0.5 K/uL   Basophils Relative 0 %   Basophils Absolute 0.0 0.0 - 0.1 K/uL   Immature Granulocytes 0 %   Abs Immature Granulocytes 0.01 0.00 - 0.07 K/uL    Comment: Performed at [facility], [address] [place], [place] [address]  Beta-hydroxybutyric acid     Status: Abnormal   Collection Time: 10/31/22  5:31 PM  Result Value Ref Range   Beta-Hydroxybutyric Acid 0.95 (H) 0.05 - 0.27 mmol/L    Comment: Performed at [facility], [address] [place], [place] [address]  Blood gas, venous     Status: Abnormal   Collection Time: 10/31/22  5:31 PM  Result Value Ref Range   pH, Ven 7.43 7.25 - 7.43   pCO2, Ven 33 (L) 44 - 60 mmHg   pO2, Ven 137 (H) 32 - 45 mmHg   Bicarbonate 21.9 20.0 - 28.0 mmol/L   Acid-base deficit 1.7 0.0 - 2.0 mmol/L   O2 Saturation 99.6 %   Patient temperature 37.0     Comment: Performed at [facility], [address], [place], [place] [address]  Urinalysis, Routine w reflex microscopic -Urine, Clean Catch     Status: Abnormal   Collection Time: 10/31/22  5:47 PM  Result Value Ref Range   Color, Urine STRAW (A) YELLOW  APPearance CLEAR CLEAR   Specific Gravity, Urine 1.021 1.005 - 1.030   pH 5.0 5.0 - 8.0   Glucose, UA >=500 (A) NEGATIVE mg/dL   Hgb urine dipstick NEGATIVE NEGATIVE   Bilirubin Urine NEGATIVE NEGATIVE   Ketones, ur NEGATIVE NEGATIVE mg/dL   Protein, ur NEGATIVE NEGATIVE mg/dL   Nitrite NEGATIVE NEGATIVE   Leukocytes,Ua NEGATIVE NEGATIVE   RBC / HPF 0-5 0 - 5 RBC/hpf   WBC, UA 0-5 0 - 5 WBC/hpf   Bacteria, UA NONE SEEN NONE SEEN   Squamous Epithelial / HPF 0-5 0 - 5 /HPF    Comment: Performed at [facility], [address] [place], [place] [address]  CBG monitoring, ED     Status: Abnormal   Collection Time: 10/31/22  7:13 PM  Result Value Ref Range   Glucose-Capillary 417 (H) 70 - 99 mg/dL    Comment: Glucose reference range applies only to samples taken after fasting for at least 8 hours.  POC CBG, ED     Status: Abnormal   Collection Time: 10/31/22  9:56 PM  Result Value Ref Range   Glucose-Capillary 301 (H) 70 - 99 mg/dL    Comment: Glucose reference range applies only to samples taken after fasting for at least 8 hours.  CBG monitoring, ED     Status: Abnormal   Collection Time: 11/01/22  1:25 AM  Result Value Ref Range   Glucose-Capillary 240 (H) 70 - 99 mg/dL    Comment: Glucose reference range applies only to samples taken after fasting for at least 8 hours.   No results found.  Pending Labs Unresulted Labs (From admission, onward)     Start     Ordered   11/01/22 0500  Basic metabolic panel  Tomorrow morning,   R        11/01/22 0204            Vitals/Pain Today's Vitals   10/31/22 2305 11/01/22 0000 11/01/22 0100 11/01/22 0155  BP: 116/71 128/79 (!) 97/56 104/63  Pulse: 92 72 71 68  Resp: 17 16 15 15   Temp:   97.8 F (36.6 C)   TempSrc:      SpO2: 95% 95% 94% 92%  Weight:      Height:      PainSc:        Isolation Precautions No active isolations  Medications Medications  lactated ringers bolus 1,000 mL (0 mLs  Intravenous Stopped 10/31/22 2240)  insulin glargine-yfgn (SEMGLEE) injection 10 Units (10 Units Subcutaneous Given 11/01/22 0220)  atorvastatin (LIPITOR) tablet 40 mg (has no administration in time range)  empagliflozin (JARDIANCE) tablet 25 mg (has no administration in time range)  buPROPion (WELLBUTRIN XL) 24 hr tablet 150 mg (has no administration in time range)  multivitamin with minerals tablet 1 tablet (has no administration in time range)  NON FORMULARY 7 mg (has no administration in time range)  traMADol (ULTRAM) tablet 50 mg (has no administration in time range)  cholecalciferol (VITAMIN D3) 25 MCG (1000 UNIT) tablet 1,000 Units (has no administration in time range)  enoxaparin (LOVENOX) injection 40 mg (has no administration in time range)  acetaminophen (TYLENOL) tablet 650 mg (has no administration in time range)    Or  acetaminophen (TYLENOL) suppository 650 mg (has no administration in time range)  ondansetron (ZOFRAN) tablet 4 mg (has no administration in time range)    Or  ondansetron (ZOFRAN) injection 4 mg (has no administration in time range)  insulin aspart (  novoLOG) injection 10 Units (10 Units Subcutaneous Given 10/31/22 2042)  acetaminophen (TYLENOL) tablet 650 mg (650 mg Oral Given 11/01/22 0117)    Mobility walks with person assist

## 2022-11-01 NOTE — Plan of Care (Signed)
  Problem: Education: Goal: Knowledge of General Education information will improve Description: Including pain rating scale, medication(s)/side effects and non-pharmacologic comfort measures 11/01/2022 0322 by Ronnald Collum, RN Outcome: Progressing 11/01/2022 0322 by Ronnald Collum, RN Outcome: Progressing   Problem: Health Behavior/Discharge Planning: Goal: Ability to manage health-related needs will improve 11/01/2022 0322 by Ronnald Collum, RN Outcome: Progressing 11/01/2022 0322 by Ronnald Collum, RN Outcome: Progressing   Problem: Clinical Measurements: Goal: Ability to maintain clinical measurements within normal limits will improve 11/01/2022 0322 by Ronnald Collum, RN Outcome: Progressing 11/01/2022 0322 by Ronnald Collum, RN Outcome: Progressing Goal: Will remain free from infection 11/01/2022 0322 by Ronnald Collum, RN Outcome: Progressing 11/01/2022 0322 by Ronnald Collum, RN Outcome: Progressing Goal: Diagnostic test results will improve 11/01/2022 0322 by Ronnald Collum, RN Outcome: Progressing 11/01/2022 0322 by Ronnald Collum, RN Outcome: Progressing Goal: Respiratory complications will improve 11/01/2022 0322 by Ronnald Collum, RN Outcome: Progressing 11/01/2022 0322 by Ronnald Collum, RN Outcome: Progressing Goal: Cardiovascular complication will be avoided 11/01/2022 0322 by Ronnald Collum, RN Outcome: Progressing 11/01/2022 0322 by Ronnald Collum, RN Outcome: Progressing   Problem: Activity: Goal: Risk for activity intolerance will decrease 11/01/2022 0322 by Ronnald Collum, RN Outcome: Progressing 11/01/2022 0322 by Ronnald Collum, RN Outcome: Progressing   Problem: Nutrition: Goal: Adequate nutrition will be maintained 11/01/2022 0322 by Ronnald Collum, RN Outcome: Progressing 11/01/2022 0322 by Ronnald Collum, RN Outcome: Progressing   Problem: Coping: Goal: Level of anxiety will decrease 11/01/2022 0322 by Ronnald Collum, RN Outcome:  Progressing 11/01/2022 0322 by Ronnald Collum, RN Outcome: Progressing   Problem: Elimination: Goal: Will not experience complications related to bowel motility 11/01/2022 0322 by Ronnald Collum, RN Outcome: Progressing 11/01/2022 0322 by Ronnald Collum, RN Outcome: Progressing Goal: Will not experience complications related to urinary retention 11/01/2022 0322 by Ronnald Collum, RN Outcome: Progressing 11/01/2022 0322 by Ronnald Collum, RN Outcome: Progressing   Problem: Pain Managment: Goal: General experience of comfort will improve 11/01/2022 0322 by Ronnald Collum, RN Outcome: Progressing 11/01/2022 0322 by Ronnald Collum, RN Outcome: Progressing   Problem: Safety: Goal: Ability to remain free from injury will improve 11/01/2022 0322 by Ronnald Collum, RN Outcome: Progressing 11/01/2022 0322 by Ronnald Collum, RN Outcome: Progressing   Problem: Skin Integrity: Goal: Risk for impaired skin integrity will decrease 11/01/2022 0322 by Ronnald Collum, RN Outcome: Progressing 11/01/2022 0322 by Ronnald Collum, RN Outcome: Progressing

## 2022-11-01 NOTE — Progress Notes (Signed)
Will get pt admitted overnight for Obs.

## 2022-11-05 DIAGNOSIS — Z794 Long term (current) use of insulin: Secondary | ICD-10-CM | POA: Diagnosis not present

## 2022-11-05 DIAGNOSIS — E1165 Type 2 diabetes mellitus with hyperglycemia: Secondary | ICD-10-CM | POA: Diagnosis not present

## 2022-11-05 DIAGNOSIS — I10 Essential (primary) hypertension: Secondary | ICD-10-CM | POA: Diagnosis not present

## 2022-11-07 DIAGNOSIS — M25551 Pain in right hip: Secondary | ICD-10-CM | POA: Diagnosis not present

## 2022-11-11 DIAGNOSIS — F321 Major depressive disorder, single episode, moderate: Secondary | ICD-10-CM | POA: Diagnosis not present

## 2022-11-14 DIAGNOSIS — Z794 Long term (current) use of insulin: Secondary | ICD-10-CM | POA: Diagnosis not present

## 2022-11-14 DIAGNOSIS — E1122 Type 2 diabetes mellitus with diabetic chronic kidney disease: Secondary | ICD-10-CM | POA: Diagnosis not present

## 2022-11-14 DIAGNOSIS — N1832 Chronic kidney disease, stage 3b: Secondary | ICD-10-CM | POA: Diagnosis not present

## 2022-11-14 DIAGNOSIS — I10 Essential (primary) hypertension: Secondary | ICD-10-CM | POA: Diagnosis not present

## 2022-11-14 DIAGNOSIS — R270 Ataxia, unspecified: Secondary | ICD-10-CM | POA: Diagnosis not present

## 2022-11-28 DIAGNOSIS — N1832 Chronic kidney disease, stage 3b: Secondary | ICD-10-CM | POA: Diagnosis not present

## 2022-11-28 DIAGNOSIS — E782 Mixed hyperlipidemia: Secondary | ICD-10-CM | POA: Diagnosis not present

## 2022-11-28 DIAGNOSIS — E1165 Type 2 diabetes mellitus with hyperglycemia: Secondary | ICD-10-CM | POA: Diagnosis not present

## 2022-11-28 DIAGNOSIS — R55 Syncope and collapse: Secondary | ICD-10-CM | POA: Diagnosis not present

## 2022-12-12 DIAGNOSIS — F321 Major depressive disorder, single episode, moderate: Secondary | ICD-10-CM | POA: Diagnosis not present

## 2022-12-17 ENCOUNTER — Other Ambulatory Visit: Payer: Self-pay | Admitting: Orthopedic Surgery

## 2022-12-17 DIAGNOSIS — M25511 Pain in right shoulder: Secondary | ICD-10-CM | POA: Diagnosis not present

## 2022-12-17 DIAGNOSIS — M7061 Trochanteric bursitis, right hip: Secondary | ICD-10-CM | POA: Diagnosis not present

## 2022-12-17 DIAGNOSIS — M75101 Unspecified rotator cuff tear or rupture of right shoulder, not specified as traumatic: Secondary | ICD-10-CM

## 2022-12-25 DIAGNOSIS — R269 Unspecified abnormalities of gait and mobility: Secondary | ICD-10-CM | POA: Diagnosis not present

## 2022-12-25 DIAGNOSIS — M7061 Trochanteric bursitis, right hip: Secondary | ICD-10-CM | POA: Diagnosis not present

## 2022-12-25 DIAGNOSIS — M6281 Muscle weakness (generalized): Secondary | ICD-10-CM | POA: Diagnosis not present

## 2022-12-27 ENCOUNTER — Ambulatory Visit: Payer: Medicare PPO | Admitting: Podiatry

## 2022-12-27 ENCOUNTER — Encounter: Payer: Self-pay | Admitting: Podiatry

## 2022-12-27 DIAGNOSIS — B070 Plantar wart: Secondary | ICD-10-CM | POA: Diagnosis not present

## 2022-12-27 NOTE — Progress Notes (Signed)
  Subjective:  Patient ID: Ariel Meyer, female    DOB: 03/03/1951,  MRN: [id number]  Chief Complaint  Patient presents with   Callouses    "I have some sort of growth.  My doctor said it's a clogged pore." N - clogged pore L - 2-3 met area D - May 2024 O - gradually worse C - tender, sore A - walking on it T - I saw a doctor at [facility] (?), she shaved it every four weeks    72 y.o. female presents with the above complaint. History confirmed with patient.   Objective:  Physical Exam: warm, good capillary refill, no trophic changes or ulcerative lesions, normal DP and PT pulses, normal sensory exam, and left foot skin lesion appearing to be consistent with a verruca plantaris with disruption of skin lines between the second and third intermetatarsal space.  Assessment:   1. Verruca plantaris      Plan:  Patient was evaluated and treated and all questions answered.  Lesion appears to be more consistent with a verruca plantaris than a porokeratosis.  I recommended debridement of the lesion and application of salinocaine.  This was done today.  We could consider Cantharone application but she has not had her new A1c checked yet and she will let me know what it is when her PCP checks it next week  Procedure: Destruction of Lesion Location: Left submetatarsal 2/3 Instrumentation: 15 blade. Technique: Debridement of lesion to petechial bleeding. Aperture pad applied around lesion. Small amount of salinocaine applied to the base of the lesion. Dressing: Dry, sterile, compression dressing. Disposition: Patient tolerated procedure well. Advised to leave dressing on for 6-8 hours. Thereafter patient to wash the area with soap and water and applied band-aid. Off-loading pads dispensed. Patient to return in 1 month for follow-up.   Return in about 1 month (around 01/27/2023) for wart treatment.

## 2023-01-03 DIAGNOSIS — I10 Essential (primary) hypertension: Secondary | ICD-10-CM | POA: Diagnosis not present

## 2023-01-03 DIAGNOSIS — E1165 Type 2 diabetes mellitus with hyperglycemia: Secondary | ICD-10-CM | POA: Diagnosis not present

## 2023-01-03 DIAGNOSIS — N1832 Chronic kidney disease, stage 3b: Secondary | ICD-10-CM | POA: Diagnosis not present

## 2023-01-03 DIAGNOSIS — N644 Mastodynia: Secondary | ICD-10-CM | POA: Diagnosis not present

## 2023-01-04 ENCOUNTER — Encounter: Payer: Self-pay | Admitting: Orthopedic Surgery

## 2023-01-04 ENCOUNTER — Other Ambulatory Visit: Payer: Self-pay | Admitting: Internal Medicine

## 2023-01-04 DIAGNOSIS — M7061 Trochanteric bursitis, right hip: Secondary | ICD-10-CM | POA: Diagnosis not present

## 2023-01-04 DIAGNOSIS — R269 Unspecified abnormalities of gait and mobility: Secondary | ICD-10-CM | POA: Diagnosis not present

## 2023-01-04 DIAGNOSIS — N644 Mastodynia: Secondary | ICD-10-CM

## 2023-01-04 DIAGNOSIS — M6281 Muscle weakness (generalized): Secondary | ICD-10-CM | POA: Diagnosis not present

## 2023-01-09 ENCOUNTER — Ambulatory Visit
Admission: RE | Admit: 2023-01-09 | Discharge: 2023-01-09 | Disposition: A | Discharge status: Home / Self Care | Payer: Medicare PPO | Source: Ambulatory Visit | Attending: Orthopedic Surgery | Admitting: Orthopedic Surgery

## 2023-01-09 DIAGNOSIS — S46111A Strain of muscle, fascia and tendon of long head of biceps, right arm, initial encounter: Secondary | ICD-10-CM | POA: Diagnosis not present

## 2023-01-09 DIAGNOSIS — M75101 Unspecified rotator cuff tear or rupture of right shoulder, not specified as traumatic: Secondary | ICD-10-CM

## 2023-01-09 DIAGNOSIS — M19011 Primary osteoarthritis, right shoulder: Secondary | ICD-10-CM | POA: Diagnosis not present

## 2023-01-09 DIAGNOSIS — M25511 Pain in right shoulder: Secondary | ICD-10-CM | POA: Diagnosis not present

## 2023-01-09 DIAGNOSIS — M7581 Other shoulder lesions, right shoulder: Secondary | ICD-10-CM | POA: Diagnosis not present

## 2023-01-11 DIAGNOSIS — M7061 Trochanteric bursitis, right hip: Secondary | ICD-10-CM | POA: Diagnosis not present

## 2023-01-11 DIAGNOSIS — M6281 Muscle weakness (generalized): Secondary | ICD-10-CM | POA: Diagnosis not present

## 2023-01-11 DIAGNOSIS — R269 Unspecified abnormalities of gait and mobility: Secondary | ICD-10-CM | POA: Diagnosis not present

## 2023-01-12 DIAGNOSIS — F321 Major depressive disorder, single episode, moderate: Secondary | ICD-10-CM | POA: Diagnosis not present

## 2023-01-13 ENCOUNTER — Ambulatory Visit
Admission: EM | Admit: 2023-01-13 | Discharge: 2023-01-13 | Disposition: A | Discharge status: Home / Self Care | Payer: Medicare PPO | Attending: Nurse Practitioner | Admitting: Nurse Practitioner

## 2023-01-13 DIAGNOSIS — L03116 Cellulitis of left lower limb: Secondary | ICD-10-CM | POA: Diagnosis not present

## 2023-01-13 MED ORDER — DOXYCYCLINE HYCLATE 100 MG PO TABS: 100.0000 mg | ORAL_TABLET | Freq: Two times a day (BID) | ORAL | 0 refills | Status: AC

## 2023-01-13 MED ORDER — TRIAMCINOLONE ACETONIDE 0.1 % EX CREA: 1.0000 | TOPICAL_CREAM | Freq: Two times a day (BID) | CUTANEOUS | 0 refills | Status: AC

## 2023-01-13 NOTE — ED Triage Notes (Signed)
Pt reports she has left ankle pain and swelling x 2 days.  Denies injury.  Pain increases with walking and touching.

## 2023-01-13 NOTE — ED Provider Notes (Signed)
[facility]    CSN: [id number] Arrival date & time: 01/13/23  1108      History   Chief Complaint No chief complaint on file.   HPI Ariel Meyer is a 72 y.o. female.   The history is provided by the patient.   Patient presents for complaints of pain and swelling to the left ankle that is been present for the past 2 days.  She describes the pain as "stabbing".  Patient denies fever, chills, injury or trauma.  She cannot recall if she has been bitten by any type of insect.  She reports that the pain in the ankle makes it difficult for her to ambulate.  She is currently using a cane.  She also states that the pain also causes her to have difficulty when she moves her ankle or foot.  She reports she has been applying ice to the affected area.  She has not taken any medication for her symptoms.  Past Medical History:  Diagnosis Date   Anxiety    Arthritis    Back problem    Chronic kidney disease    Complication of anesthesia    wakes up durning surgery   Depression    Diabetes mellitus without complication (HCC)    Dizziness    GERD (gastroesophageal reflux disease)    Hyperlipidemia    Hypertension    Pneumonia    Seasonal allergies    Shortness of breath dyspnea    Sleep apnea     Patient Active Problem List   Diagnosis Date Noted   CKD stage 3b, GFR 30-44 ml/min (HCC) 11/01/2022   Hyperglycemia 11/01/2022   Upper airway cough syndrome 06/21/2015   Acute encephalopathy 05/10/2014   Alcohol withdrawal delirium (HCC) 05/10/2014   Difficult intubation    Acute respiratory failure with hypoxia (HCC)    Angioedema 05/06/2014   Goiter 05/06/2014   Hypertension 05/06/2014   Acute sinusitis 02/10/2014   Nausea with vomiting 02/10/2014   DM (diabetes mellitus), type 2 with renal complications (HCC) 02/08/2014   Renal insufficiency 02/07/2014   Acute renal failure (HCC) 02/07/2014   Enteritis due to Clostridium difficile 02/07/2014   Hypotension  02/07/2014   Benign hypertension 02/07/2014   Obesity, morbid (HCC) 02/07/2014   GERD (gastroesophageal reflux disease) 02/07/2014   Hyperlipidemia 02/07/2014   Sciatica 06/10/2012   Lumbar compression fracture (HCC) 06/10/2012    Past Surgical History:  Procedure Laterality Date   BACK SURGERY     KYPHOPLASTY N/A 08/12/2012   Procedure: KYPHOPLASTY;  Surgeon: Clydene Fake, MD;  Location: [facility];  Service: Neurosurgery;  Laterality: N/A;  L1 Balloon Kyphoplasty   NASAL SINUS SURGERY     TONSILLECTOMY     TUBAL LIGATION      OB History   No obstetric history on file.      Home Medications    Prior to Admission medications   Medication Sig Start Date End Date Taking? Authorizing Provider  doxycycline (VIBRA-TABS) 100 MG tablet Take 1 tablet (100 mg total) by mouth 2 (two) times daily for 5 days. 01/13/23 01/18/23 Yes Shrihan Putt-Warren, Sadie Haber, NP  triamcinolone cream (KENALOG) 0.1 % Apply 1 Application topically 2 (two) times daily. 01/13/23  Yes Renesmee Raine-Warren, Sadie Haber, NP  acetaminophen (TYLENOL) 500 MG tablet Take 500 mg by mouth every 6 (six) hours as needed for moderate pain.    [provider]  albuterol (VENTOLIN HFA) 108 (90 Base) MCG/ACT inhaler Inhale into the lungs every 6 (  six) hours as needed for wheezing or shortness of breath.    [provider]  ARNUITY ELLIPTA 100 MCG/ACT AEPB Inhale 1 puff into the lungs daily as needed (sob).    [provider]  atorvastatin (LIPITOR) 40 MG tablet Take 40 mg by mouth every evening.    [provider]  benzonatate (TESSALON) 100 MG capsule Take 1 capsule (100 mg total) by mouth every 8 (eight) hours. Patient not taking: Reported on 10/31/2022 06/09/22   Particia Nearing, PA-C  buPROPion (WELLBUTRIN XL) 150 MG 24 hr tablet Take 150 mg by mouth daily.    [provider]  cholecalciferol (VITAMIN D) 1000 UNITS tablet Take 1,000 Units by mouth daily.    [provider]   docusate sodium (COLACE) 50 MG capsule Take 50 mg by mouth daily as needed for mild constipation.    [provider]  fluticasone (FLONASE) 50 MCG/ACT nasal spray Place 2 sprays into both nostrils daily. Patient taking differently: Place 2 sprays into both nostrils daily as needed for allergies. 02/12/14   Joseph Art, DO  guaiFENesin (MUCINEX) 600 MG 12 hr tablet Take 600 mg by mouth 2 (two) times daily as needed for cough.    [provider]  insulin glargine (LANTUS SOLOSTAR) 100 UNIT/ML Solostar Pen Inject 12 Units into the skin at bedtime. 11/01/22   Leatha Gilding, MD  Insulin Pen Needle (PEN NEEDLES 3/16") 31G X 5 MM MISC 1 each by Does not apply route daily. 11/01/22   Leatha Gilding, MD  JARDIANCE 25 MG TABS tablet Take 25 mg by mouth daily. 07/11/22   [provider]  levocetirizine (XYZAL) 5 MG tablet Take 5 mg by mouth every evening.    [provider]  Multiple Vitamin (MULTIVITAMIN WITH MINERALS) TABS Take 1 tablet by mouth daily.    [provider]  traMADol (ULTRAM) 50 MG tablet Take 50 mg by mouth 2 (two) times daily as needed for moderate pain or severe pain. 10/19/22   [provider]    Family History Family History  Problem Relation Age of Onset   Heart disease Other    Arthritis Other     Social History Social History   Tobacco Use   Smoking status: Former    Current packs/day: 0.00    Average packs/day: 0.8 packs/day for 30.0 years (22.5 ttl pk-yrs)    Types: Cigarettes    Start date: 05/15/1979    Quit date: 05/14/2009    Years since quitting: 13.6   Smokeless tobacco: Never  Substance Use Topics   Alcohol use: Not Currently    Alcohol/week: 49.0 standard drinks of alcohol    Types: 35 Shots of liquor, 14 Standard drinks or equivalent per week   Drug use: No     Allergies   Lisinopril, Sudafed 12 hour [pseudoephedrine hcl er], Dilaudid [hydromorphone hcl], Fosamax [alendronate sodium], Levaquin  leva-pak [levofloxacin], Macrodantin [nitrofurantoin macrocrystal], Macrodantin [nitrofurantoin], Norvasc [amlodipine besylate], Penicillins, Sudafed [pseudoephedrine hcl], and Sulfa antibiotics   Review of Systems Review of Systems Per HPI  Physical Exam Triage Vital Signs ED Triage Vitals  Encounter Vitals Group     BP 01/13/23 1149 136/83     Systolic BP Percentile --      Diastolic BP Percentile --      Pulse Rate 01/13/23 1149 81     Resp 01/13/23 1149 16     Temp 01/13/23 1149 97.9 F (36.6 C)     Temp Source  01/13/23 1149 Oral     SpO2 01/13/23 1149 95 %     Weight --      Height --      Head Circumference --      Peak Flow --      Pain Score 01/13/23 1150 10     Pain Loc --      Pain Education --      Exclude from Growth Chart --    No data found.  Updated Vital Signs BP 136/83 (BP Location: Right Arm)   Pulse 81   Temp 97.9 F (36.6 C) (Oral)   Resp 16   SpO2 95%   Visual Acuity Right Eye Distance:   Left Eye Distance:   Bilateral Distance:    Right Eye Near:   Left Eye Near:    Bilateral Near:     Physical Exam Vitals and nursing note reviewed.  Constitutional:      General: She is not in acute distress.    Appearance: Normal appearance.  HENT:     Head: Normocephalic.  Eyes:     Extraocular Movements: Extraocular movements intact.     Pupils: Pupils are equal, round, and reactive to light.  Pulmonary:     Effort: Pulmonary effort is normal.  Musculoskeletal:     Left ankle: Swelling present. No deformity or ecchymosis. Tenderness present over the medial malleolus. No lateral malleolus tenderness. Decreased range of motion: d/t pain. Normal pulse.     Comments: Localized erythema and swelling to the left medial malleolus.  Area is warm to palpation.  There is no oozing, fluctuance, or drainage present.  Skin:    General: Skin is warm and dry.  Neurological:     General: No focal deficit present.     Mental Status: She is alert and oriented  to person, place, and time.  Psychiatric:        Mood and Affect: Mood normal.        Behavior: Behavior normal.      UC Treatments / Results  Labs (all labs ordered are listed, but only abnormal results are displayed) Labs Reviewed - No data to display  EKG   Radiology No results found.  Procedures Procedures (including critical care time)  Medications Ordered in UC Medications - No data to display  Initial Impression / Assessment and Plan / UC Course  I have reviewed the triage vital signs and the nursing notes.  Pertinent labs & imaging results that were available during my care of the patient were reviewed by me and considered in my medical decision making (see chart for details).  The patient is well-appearing, she is in no acute distress, vital signs are stable.  Suspect cellulitis of the left ankle.  Patient is a type II diabetic, most recent hemoglobin A1c seen in chart on 11/01/2022 was 12.9.  Also after review of chart, patient was treated for gout in 2018.  Discussed with patient that if symptoms do not improve with this treatment, symptoms most likely associated with gout.  In the interim, will treat empirically with doxycycline 100 mg twice daily for the next 5 days along with triamcinolone cream 0.1% to apply topically for inflammation.  Supportive care recommendations were provided and discussed with the patient to include over-the-counter Tylenol for pain or discomfort, cool compresses to the affected area, and to continue to monitor the area for worsening.  Patient was given strict follow-up precautions, recommend that she follow-up in 48 to 72 hours if  symptoms do not improve..  Patient is in agreement with this plan of care and verbalizes understanding.  All questions were answered.  Patient stable for discharge.   Final Clinical Impressions(s) / UC Diagnoses   Final diagnoses:  Cellulitis of left ankle     Discharge Instructions      Take medication as  prescribed. Apply cool compresses to the affected area.  Apply for 20 minutes, remove for 1 hour, repeat as much as possible for the next 48 hours.   Clean the area at least twice daily with Dial Gold bar soap or another type of antibacterial soap. May take over-the-counter Tylenol as needed for pain, fever, or general discomfort. If you notice symptoms appear to be worsening to include increased redness, swelling, drainage, or if you develop fever, chills, or other concerns, please go to the emergency department immediately for further evaluation. If symptoms are not improving after the next 48 to 72 hours, you may follow-up in this clinic or with your primary care physician for further evaluation. Follow-up as needed.      ED Prescriptions     Medication Sig Dispense Auth. Provider   doxycycline (VIBRA-TABS) 100 MG tablet Take 1 tablet (100 mg total) by mouth 2 (two) times daily for 5 days. 10 tablet Nadiyah Zeis-Warren, Sadie Haber, NP   triamcinolone cream (KENALOG) 0.1 % Apply 1 Application topically 2 (two) times daily. 45 g Tomisha Reppucci-Warren, Sadie Haber, NP      PDMP not reviewed this encounter.   Abran Cantor, NP 01/13/23 1230

## 2023-01-13 NOTE — Discharge Instructions (Signed)
Take medication as prescribed. Apply cool compresses to the affected area.  Apply for 20 minutes, remove for 1 hour, repeat as much as possible for the next 48 hours.   Clean the area at least twice daily with Dial Gold bar soap or another type of antibacterial soap. May take over-the-counter Tylenol as needed for pain, fever, or general discomfort. If you notice symptoms appear to be worsening to include increased redness, swelling, drainage, or if you develop fever, chills, or other concerns, please go to the emergency department immediately for further evaluation. If symptoms are not improving after the next 48 to 72 hours, you may follow-up in this clinic or with your primary care physician for further evaluation. Follow-up as needed.

## 2023-01-22 ENCOUNTER — Ambulatory Visit: Payer: Medicare PPO | Admitting: Podiatry

## 2023-01-22 ENCOUNTER — Encounter: Payer: Self-pay | Admitting: Podiatry

## 2023-01-22 DIAGNOSIS — M109 Gout, unspecified: Secondary | ICD-10-CM | POA: Diagnosis not present

## 2023-01-22 DIAGNOSIS — L03116 Cellulitis of left lower limb: Secondary | ICD-10-CM

## 2023-01-22 DIAGNOSIS — M10472 Other secondary gout, left ankle and foot: Secondary | ICD-10-CM | POA: Diagnosis not present

## 2023-01-22 MED ORDER — DOXYCYCLINE HYCLATE 100 MG PO TABS: 100.0000 mg | ORAL_TABLET | Freq: Two times a day (BID) | ORAL | 0 refills | Status: AC

## 2023-01-23 DIAGNOSIS — M25511 Pain in right shoulder: Secondary | ICD-10-CM | POA: Diagnosis not present

## 2023-01-23 LAB — BASIC METABOLIC PANEL
BUN/Creatinine Ratio: 10 — ABNORMAL LOW (ref 12–28)
BUN: 16 mg/dL (ref 8–27)
CO2: 24 mmol/L (ref 20–29)
Calcium: 9.6 mg/dL (ref 8.7–10.3)
Chloride: 103 mmol/L (ref 96–106)
Creatinine, Ser: 1.55 mg/dL — ABNORMAL HIGH (ref 0.57–1.00)
Glucose: 158 mg/dL — ABNORMAL HIGH (ref 70–99)
Potassium: 4.6 mmol/L (ref 3.5–5.2)
Sodium: 141 mmol/L (ref 134–144)
eGFR: 35 mL/min/{1.73_m2} — ABNORMAL LOW (ref >59)

## 2023-01-23 LAB — CBC WITH DIFFERENTIAL/PLATELET
Basophils Absolute: 0 10*3/uL (ref 0.0–0.2)
Basos: 0 %
EOS (ABSOLUTE): 0.4 10*3/uL (ref 0.0–0.4)
Eos: 4 %
Hematocrit: 38.5 % (ref 34.0–46.6)
Hemoglobin: 12.8 g/dL (ref 11.1–15.9)
Immature Grans (Abs): 0 10*3/uL (ref 0.0–0.1)
Immature Granulocytes: 0 %
Lymphocytes Absolute: 2.9 10*3/uL (ref 0.7–3.1)
Lymphs: 30 %
MCH: 31.1 pg (ref 26.6–33.0)
MCHC: 33.2 g/dL (ref 31.5–35.7)
MCV: 94 fL (ref 79–97)
Monocytes Absolute: 0.7 10*3/uL (ref 0.1–0.9)
Monocytes: 7 %
Neutrophils Absolute: 5.8 10*3/uL (ref 1.4–7.0)
Neutrophils: 59 %
Platelets: 251 10*3/uL (ref 150–450)
RBC: 4.11 x10E6/uL (ref 3.77–5.28)
RDW: 11.8 % (ref 11.7–15.4)
WBC: 9.8 10*3/uL (ref 3.4–10.8)

## 2023-01-23 LAB — URIC ACID: Uric Acid: 5.9 mg/dL (ref 3.1–7.9)

## 2023-01-23 LAB — SEDIMENTATION RATE: Sed Rate: 14 mm/h (ref 0–40)

## 2023-01-23 LAB — C-REACTIVE PROTEIN: CRP: 4 mg/L (ref 0–10)

## 2023-01-27 NOTE — Progress Notes (Signed)
Subjective:  Patient ID: Ariel Meyer, female    DOB: 1951/04/30,  MRN: [id number]  Chief Complaint  Patient presents with   Plantar Warts    Patient is here for left ankle and foot swelling possibly gout    72 y.o. female presents with the above complaint. History confirmed with patient. Has been going on since 01/13/23 and she went to ED for this, was placed on 5 days abx.  Objective:  Physical Exam: warm, good capillary refill, no trophic changes or ulcerative lesions, normal DP and PT pulses, normal sensory exam, and swelling and edema on ankle  medially with tenderness  Assessment:   1. Other secondary acute gout of left ankle   2. Cellulitis of left ankle      Plan:  Patient was evaluated and treated and all questions answered.   Discussed etiology of her sx including celluliits vs gout. Recommended lab work eval w/ CBC BMP ESR CRP Uric and treat empirically for cellulitis with 14 days doxycycline (PCN allergy). Her lab work was unrevealing for gout and suspect this is partially treated cellulitis. Will re-eval in 3 weeks and tx skin lesions then   Return in about 3 weeks (around 02/12/2023) for follow up on cellulitis/gout, skin lesion treatment.

## 2023-01-30 ENCOUNTER — Ambulatory Visit: Admission: RE | Admit: 2023-01-30 | Payer: Medicare PPO | Source: Ambulatory Visit

## 2023-01-30 ENCOUNTER — Ambulatory Visit
Admission: RE | Admit: 2023-01-30 | Discharge: 2023-01-30 | Disposition: A | Discharge status: Home / Self Care | Payer: Medicare PPO | Source: Ambulatory Visit | Attending: Internal Medicine | Admitting: Internal Medicine

## 2023-01-30 DIAGNOSIS — N644 Mastodynia: Secondary | ICD-10-CM | POA: Diagnosis not present

## 2023-02-06 DIAGNOSIS — E1169 Type 2 diabetes mellitus with other specified complication: Secondary | ICD-10-CM | POA: Diagnosis not present

## 2023-02-11 DIAGNOSIS — F321 Major depressive disorder, single episode, moderate: Secondary | ICD-10-CM | POA: Diagnosis not present

## 2023-02-14 ENCOUNTER — Encounter: Payer: Self-pay | Admitting: Podiatry

## 2023-02-14 ENCOUNTER — Ambulatory Visit: Payer: Medicare PPO | Admitting: Podiatry

## 2023-02-14 DIAGNOSIS — L03116 Cellulitis of left lower limb: Secondary | ICD-10-CM

## 2023-02-14 DIAGNOSIS — B070 Plantar wart: Secondary | ICD-10-CM | POA: Diagnosis not present

## 2023-02-14 NOTE — Progress Notes (Signed)
Subjective:  Patient ID: Ariel Meyer, female    DOB: 1950/09/15,  MRN: [id number]  Chief Complaint  Patient presents with   Ankle Pain    Left ankle, follow up for secondary gout. Improving. Pain and swelling to foot and ankle mostly resolved. Still some pain left ankle    72 y.o. female presents with the above complaint. History confirmed with patient.  Cellulitis has resolved and she is doing much better.  The lesion is doing better but still has some tenderness and fullness there  Objective:  Physical Exam: warm, good capillary refill, no trophic changes or ulcerative lesions, normal DP and PT pulses, normal sensory exam, and cellulitis has resolved, verruca plantaris much smaller but still present in second plantar interspace   Lab work was unrevealing for gout   Assessment:   1. Cellulitis of left ankle   2. Verruca plantaris      Plan:  Patient was evaluated and treated and all questions answered.  Improved greatly.  No further treatment necessary at this point.  She is cleared from my standpoint to proceed with her shoulder surgery as there is no infection in the foot or ankle    Verruca has nearly fully resolved but is still present.  Today, recommended treatment with salinocaine as noted in procedure note below.   Procedure: Destruction of Lesion Location: Left foot second interspace plantar Instrumentation: 15 blade. Technique: Debridement of lesion to petechial bleeding. Aperture pad applied around lesion. Small amount of salinocaine applied to the base of the lesion. Dressing: Dry, sterile, compression dressing. Disposition: Patient tolerated procedure well. Advised to leave dressing on for 24 hours remove and wash with soap and water.  Return as needed if it does not improve  Return if symptoms worsen or fail to improve.

## 2023-02-25 DIAGNOSIS — Z23 Encounter for immunization: Secondary | ICD-10-CM | POA: Diagnosis not present

## 2023-03-01 DIAGNOSIS — E782 Mixed hyperlipidemia: Secondary | ICD-10-CM | POA: Diagnosis not present

## 2023-03-01 DIAGNOSIS — I6523 Occlusion and stenosis of bilateral carotid arteries: Secondary | ICD-10-CM | POA: Diagnosis not present

## 2023-03-01 DIAGNOSIS — R945 Abnormal results of liver function studies: Secondary | ICD-10-CM | POA: Diagnosis not present

## 2023-03-01 DIAGNOSIS — N1832 Chronic kidney disease, stage 3b: Secondary | ICD-10-CM | POA: Diagnosis not present

## 2023-03-01 DIAGNOSIS — I10 Essential (primary) hypertension: Secondary | ICD-10-CM | POA: Diagnosis not present

## 2023-03-01 DIAGNOSIS — E1165 Type 2 diabetes mellitus with hyperglycemia: Secondary | ICD-10-CM | POA: Diagnosis not present

## 2023-03-05 DIAGNOSIS — E785 Hyperlipidemia, unspecified: Secondary | ICD-10-CM | POA: Diagnosis not present

## 2023-03-05 DIAGNOSIS — E1122 Type 2 diabetes mellitus with diabetic chronic kidney disease: Secondary | ICD-10-CM | POA: Diagnosis not present

## 2023-03-05 DIAGNOSIS — I129 Hypertensive chronic kidney disease with stage 1 through stage 4 chronic kidney disease, or unspecified chronic kidney disease: Secondary | ICD-10-CM | POA: Diagnosis not present

## 2023-03-05 DIAGNOSIS — N1832 Chronic kidney disease, stage 3b: Secondary | ICD-10-CM | POA: Diagnosis not present

## 2023-03-14 DIAGNOSIS — F321 Major depressive disorder, single episode, moderate: Secondary | ICD-10-CM | POA: Diagnosis not present

## 2023-03-21 DIAGNOSIS — G8918 Other acute postprocedural pain: Secondary | ICD-10-CM | POA: Diagnosis not present

## 2023-03-21 DIAGNOSIS — S46011A Strain of muscle(s) and tendon(s) of the rotator cuff of right shoulder, initial encounter: Secondary | ICD-10-CM | POA: Diagnosis not present

## 2023-03-21 DIAGNOSIS — M7551 Bursitis of right shoulder: Secondary | ICD-10-CM | POA: Diagnosis not present

## 2023-03-21 DIAGNOSIS — S46811A Strain of other muscles, fascia and tendons at shoulder and upper arm level, right arm, initial encounter: Secondary | ICD-10-CM | POA: Diagnosis not present

## 2023-03-21 DIAGNOSIS — M19011 Primary osteoarthritis, right shoulder: Secondary | ICD-10-CM | POA: Diagnosis not present

## 2023-03-21 DIAGNOSIS — M24111 Other articular cartilage disorders, right shoulder: Secondary | ICD-10-CM | POA: Diagnosis not present

## 2023-03-21 DIAGNOSIS — M7541 Impingement syndrome of right shoulder: Secondary | ICD-10-CM | POA: Diagnosis not present

## 2023-03-28 DIAGNOSIS — S46011D Strain of muscle(s) and tendon(s) of the rotator cuff of right shoulder, subsequent encounter: Secondary | ICD-10-CM | POA: Diagnosis not present

## 2023-04-09 DIAGNOSIS — S46011D Strain of muscle(s) and tendon(s) of the rotator cuff of right shoulder, subsequent encounter: Secondary | ICD-10-CM | POA: Diagnosis not present

## 2023-04-13 DIAGNOSIS — F321 Major depressive disorder, single episode, moderate: Secondary | ICD-10-CM | POA: Diagnosis not present

## 2023-04-15 DIAGNOSIS — S46011D Strain of muscle(s) and tendon(s) of the rotator cuff of right shoulder, subsequent encounter: Secondary | ICD-10-CM | POA: Diagnosis not present

## 2023-04-17 DIAGNOSIS — S46011D Strain of muscle(s) and tendon(s) of the rotator cuff of right shoulder, subsequent encounter: Secondary | ICD-10-CM | POA: Diagnosis not present

## 2023-04-22 DIAGNOSIS — S46011D Strain of muscle(s) and tendon(s) of the rotator cuff of right shoulder, subsequent encounter: Secondary | ICD-10-CM | POA: Diagnosis not present

## 2023-04-25 DIAGNOSIS — K219 Gastro-esophageal reflux disease without esophagitis: Secondary | ICD-10-CM | POA: Diagnosis not present

## 2023-04-25 DIAGNOSIS — E538 Deficiency of other specified B group vitamins: Secondary | ICD-10-CM | POA: Diagnosis not present

## 2023-04-25 DIAGNOSIS — I10 Essential (primary) hypertension: Secondary | ICD-10-CM | POA: Diagnosis not present

## 2023-04-25 DIAGNOSIS — E782 Mixed hyperlipidemia: Secondary | ICD-10-CM | POA: Diagnosis not present

## 2023-04-25 DIAGNOSIS — Z9181 History of falling: Secondary | ICD-10-CM | POA: Diagnosis not present

## 2023-04-25 DIAGNOSIS — E559 Vitamin D deficiency, unspecified: Secondary | ICD-10-CM | POA: Diagnosis not present

## 2023-04-25 DIAGNOSIS — E1169 Type 2 diabetes mellitus with other specified complication: Secondary | ICD-10-CM | POA: Diagnosis not present

## 2023-04-25 DIAGNOSIS — Z794 Long term (current) use of insulin: Secondary | ICD-10-CM | POA: Diagnosis not present

## 2023-04-25 DIAGNOSIS — M810 Age-related osteoporosis without current pathological fracture: Secondary | ICD-10-CM | POA: Diagnosis not present

## 2023-04-25 DIAGNOSIS — F3341 Major depressive disorder, recurrent, in partial remission: Secondary | ICD-10-CM | POA: Diagnosis not present

## 2023-04-25 DIAGNOSIS — E1122 Type 2 diabetes mellitus with diabetic chronic kidney disease: Secondary | ICD-10-CM | POA: Diagnosis not present

## 2023-04-25 DIAGNOSIS — Z0000 Encounter for general adult medical examination without abnormal findings: Secondary | ICD-10-CM | POA: Diagnosis not present

## 2023-04-25 DIAGNOSIS — J321 Chronic frontal sinusitis: Secondary | ICD-10-CM | POA: Diagnosis not present

## 2023-04-26 DIAGNOSIS — N1832 Chronic kidney disease, stage 3b: Secondary | ICD-10-CM | POA: Diagnosis not present

## 2023-04-26 DIAGNOSIS — I10 Essential (primary) hypertension: Secondary | ICD-10-CM | POA: Diagnosis not present

## 2023-04-26 DIAGNOSIS — E782 Mixed hyperlipidemia: Secondary | ICD-10-CM | POA: Diagnosis not present

## 2023-04-26 DIAGNOSIS — I6523 Occlusion and stenosis of bilateral carotid arteries: Secondary | ICD-10-CM | POA: Diagnosis not present

## 2023-04-26 DIAGNOSIS — E1165 Type 2 diabetes mellitus with hyperglycemia: Secondary | ICD-10-CM | POA: Diagnosis not present

## 2023-04-30 DIAGNOSIS — S46011D Strain of muscle(s) and tendon(s) of the rotator cuff of right shoulder, subsequent encounter: Secondary | ICD-10-CM | POA: Diagnosis not present

## 2023-05-02 DIAGNOSIS — S46011D Strain of muscle(s) and tendon(s) of the rotator cuff of right shoulder, subsequent encounter: Secondary | ICD-10-CM | POA: Diagnosis not present

## 2023-05-06 DIAGNOSIS — S46011D Strain of muscle(s) and tendon(s) of the rotator cuff of right shoulder, subsequent encounter: Secondary | ICD-10-CM | POA: Diagnosis not present

## 2023-05-09 DIAGNOSIS — S46011D Strain of muscle(s) and tendon(s) of the rotator cuff of right shoulder, subsequent encounter: Secondary | ICD-10-CM | POA: Diagnosis not present

## 2023-05-14 DIAGNOSIS — F321 Major depressive disorder, single episode, moderate: Secondary | ICD-10-CM | POA: Diagnosis not present

## 2023-05-16 DIAGNOSIS — S46011D Strain of muscle(s) and tendon(s) of the rotator cuff of right shoulder, subsequent encounter: Secondary | ICD-10-CM | POA: Diagnosis not present

## 2023-05-21 DIAGNOSIS — S46011D Strain of muscle(s) and tendon(s) of the rotator cuff of right shoulder, subsequent encounter: Secondary | ICD-10-CM | POA: Diagnosis not present

## 2023-05-22 DIAGNOSIS — M6749 Ganglion, multiple sites: Secondary | ICD-10-CM | POA: Diagnosis not present

## 2023-05-22 DIAGNOSIS — N1832 Chronic kidney disease, stage 3b: Secondary | ICD-10-CM | POA: Diagnosis not present

## 2023-05-22 DIAGNOSIS — E1169 Type 2 diabetes mellitus with other specified complication: Secondary | ICD-10-CM | POA: Diagnosis not present

## 2023-05-23 DIAGNOSIS — S46011D Strain of muscle(s) and tendon(s) of the rotator cuff of right shoulder, subsequent encounter: Secondary | ICD-10-CM | POA: Diagnosis not present

## 2023-05-28 DIAGNOSIS — S46011D Strain of muscle(s) and tendon(s) of the rotator cuff of right shoulder, subsequent encounter: Secondary | ICD-10-CM | POA: Diagnosis not present

## 2023-05-31 DIAGNOSIS — S46011D Strain of muscle(s) and tendon(s) of the rotator cuff of right shoulder, subsequent encounter: Secondary | ICD-10-CM | POA: Diagnosis not present

## 2023-06-04 DIAGNOSIS — S46011D Strain of muscle(s) and tendon(s) of the rotator cuff of right shoulder, subsequent encounter: Secondary | ICD-10-CM | POA: Diagnosis not present

## 2023-06-04 DIAGNOSIS — M71341 Other bursal cyst, right hand: Secondary | ICD-10-CM | POA: Diagnosis not present

## 2023-06-07 DIAGNOSIS — Z1212 Encounter for screening for malignant neoplasm of rectum: Secondary | ICD-10-CM | POA: Diagnosis not present

## 2023-06-07 DIAGNOSIS — Z1211 Encounter for screening for malignant neoplasm of colon: Secondary | ICD-10-CM | POA: Diagnosis not present

## 2023-06-11 DIAGNOSIS — S46011D Strain of muscle(s) and tendon(s) of the rotator cuff of right shoulder, subsequent encounter: Secondary | ICD-10-CM | POA: Diagnosis not present

## 2023-06-14 DIAGNOSIS — F321 Major depressive disorder, single episode, moderate: Secondary | ICD-10-CM | POA: Diagnosis not present

## 2023-06-14 DIAGNOSIS — M79641 Pain in right hand: Secondary | ICD-10-CM | POA: Diagnosis not present

## 2023-06-14 LAB — EXTERNAL GENERIC LAB PROCEDURE: COLOGUARD: NEGATIVE

## 2023-06-14 LAB — COLOGUARD: COLOGUARD: NEGATIVE

## 2023-06-17 DIAGNOSIS — G4733 Obstructive sleep apnea (adult) (pediatric): Secondary | ICD-10-CM | POA: Diagnosis not present

## 2023-06-18 DIAGNOSIS — S46011D Strain of muscle(s) and tendon(s) of the rotator cuff of right shoulder, subsequent encounter: Secondary | ICD-10-CM | POA: Diagnosis not present

## 2023-06-25 DIAGNOSIS — S46011D Strain of muscle(s) and tendon(s) of the rotator cuff of right shoulder, subsequent encounter: Secondary | ICD-10-CM | POA: Diagnosis not present

## 2023-06-26 DIAGNOSIS — S060X9A Concussion with loss of consciousness of unspecified duration, initial encounter: Secondary | ICD-10-CM | POA: Diagnosis not present

## 2023-06-26 DIAGNOSIS — E1169 Type 2 diabetes mellitus with other specified complication: Secondary | ICD-10-CM | POA: Diagnosis not present

## 2023-06-26 DIAGNOSIS — I951 Orthostatic hypotension: Secondary | ICD-10-CM | POA: Diagnosis not present

## 2023-06-26 DIAGNOSIS — K5900 Constipation, unspecified: Secondary | ICD-10-CM | POA: Diagnosis not present

## 2023-06-26 DIAGNOSIS — W19XXXA Unspecified fall, initial encounter: Secondary | ICD-10-CM | POA: Diagnosis not present

## 2023-06-28 DIAGNOSIS — R2231 Localized swelling, mass and lump, right upper limb: Secondary | ICD-10-CM | POA: Diagnosis not present

## 2023-07-09 DIAGNOSIS — S46011D Strain of muscle(s) and tendon(s) of the rotator cuff of right shoulder, subsequent encounter: Secondary | ICD-10-CM | POA: Diagnosis not present

## 2023-07-25 DIAGNOSIS — E559 Vitamin D deficiency, unspecified: Secondary | ICD-10-CM | POA: Diagnosis not present

## 2023-07-25 DIAGNOSIS — I6523 Occlusion and stenosis of bilateral carotid arteries: Secondary | ICD-10-CM | POA: Diagnosis not present

## 2023-07-25 DIAGNOSIS — N1832 Chronic kidney disease, stage 3b: Secondary | ICD-10-CM | POA: Diagnosis not present

## 2023-07-25 DIAGNOSIS — R945 Abnormal results of liver function studies: Secondary | ICD-10-CM | POA: Diagnosis not present

## 2023-07-25 DIAGNOSIS — E1165 Type 2 diabetes mellitus with hyperglycemia: Secondary | ICD-10-CM | POA: Diagnosis not present

## 2023-07-25 DIAGNOSIS — E1169 Type 2 diabetes mellitus with other specified complication: Secondary | ICD-10-CM | POA: Diagnosis not present

## 2023-07-25 DIAGNOSIS — G4733 Obstructive sleep apnea (adult) (pediatric): Secondary | ICD-10-CM | POA: Diagnosis not present

## 2023-07-25 DIAGNOSIS — I10 Essential (primary) hypertension: Secondary | ICD-10-CM | POA: Diagnosis not present

## 2023-07-29 ENCOUNTER — Other Ambulatory Visit: Payer: Self-pay | Admitting: Internal Medicine

## 2023-07-29 DIAGNOSIS — Z1231 Encounter for screening mammogram for malignant neoplasm of breast: Secondary | ICD-10-CM

## 2023-07-29 DIAGNOSIS — S46011D Strain of muscle(s) and tendon(s) of the rotator cuff of right shoulder, subsequent encounter: Secondary | ICD-10-CM | POA: Diagnosis not present

## 2023-08-13 IMAGING — CR DG CHEST 2V
2 series · 2 of 2 positions shown · non-contrast
Comparison: 11/24/2019

CLINICAL DATA: Cough, sob, abnormal lung sounds on exam

EXAM:
CHEST - 2 VIEW

[w chest pa]
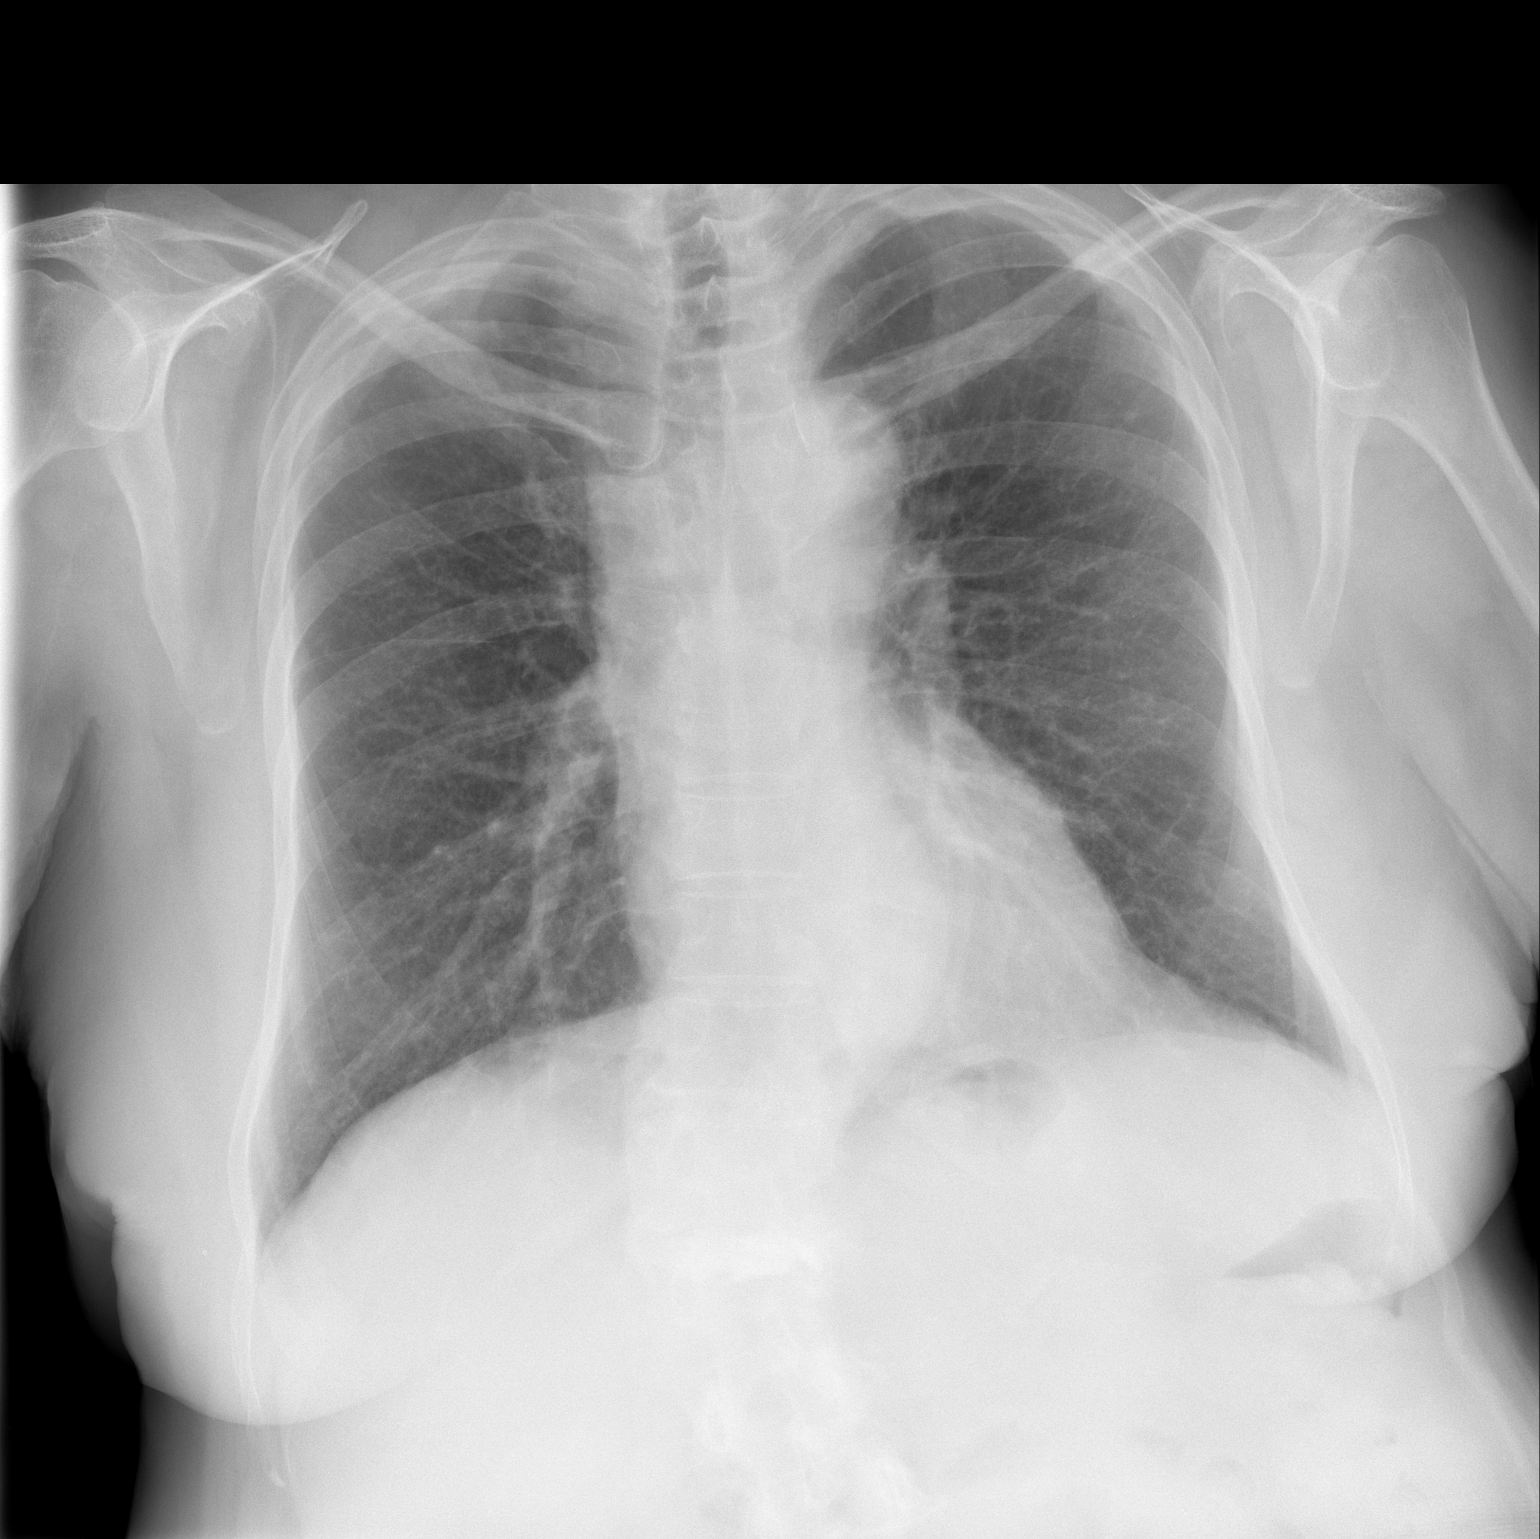

[w chest lat]
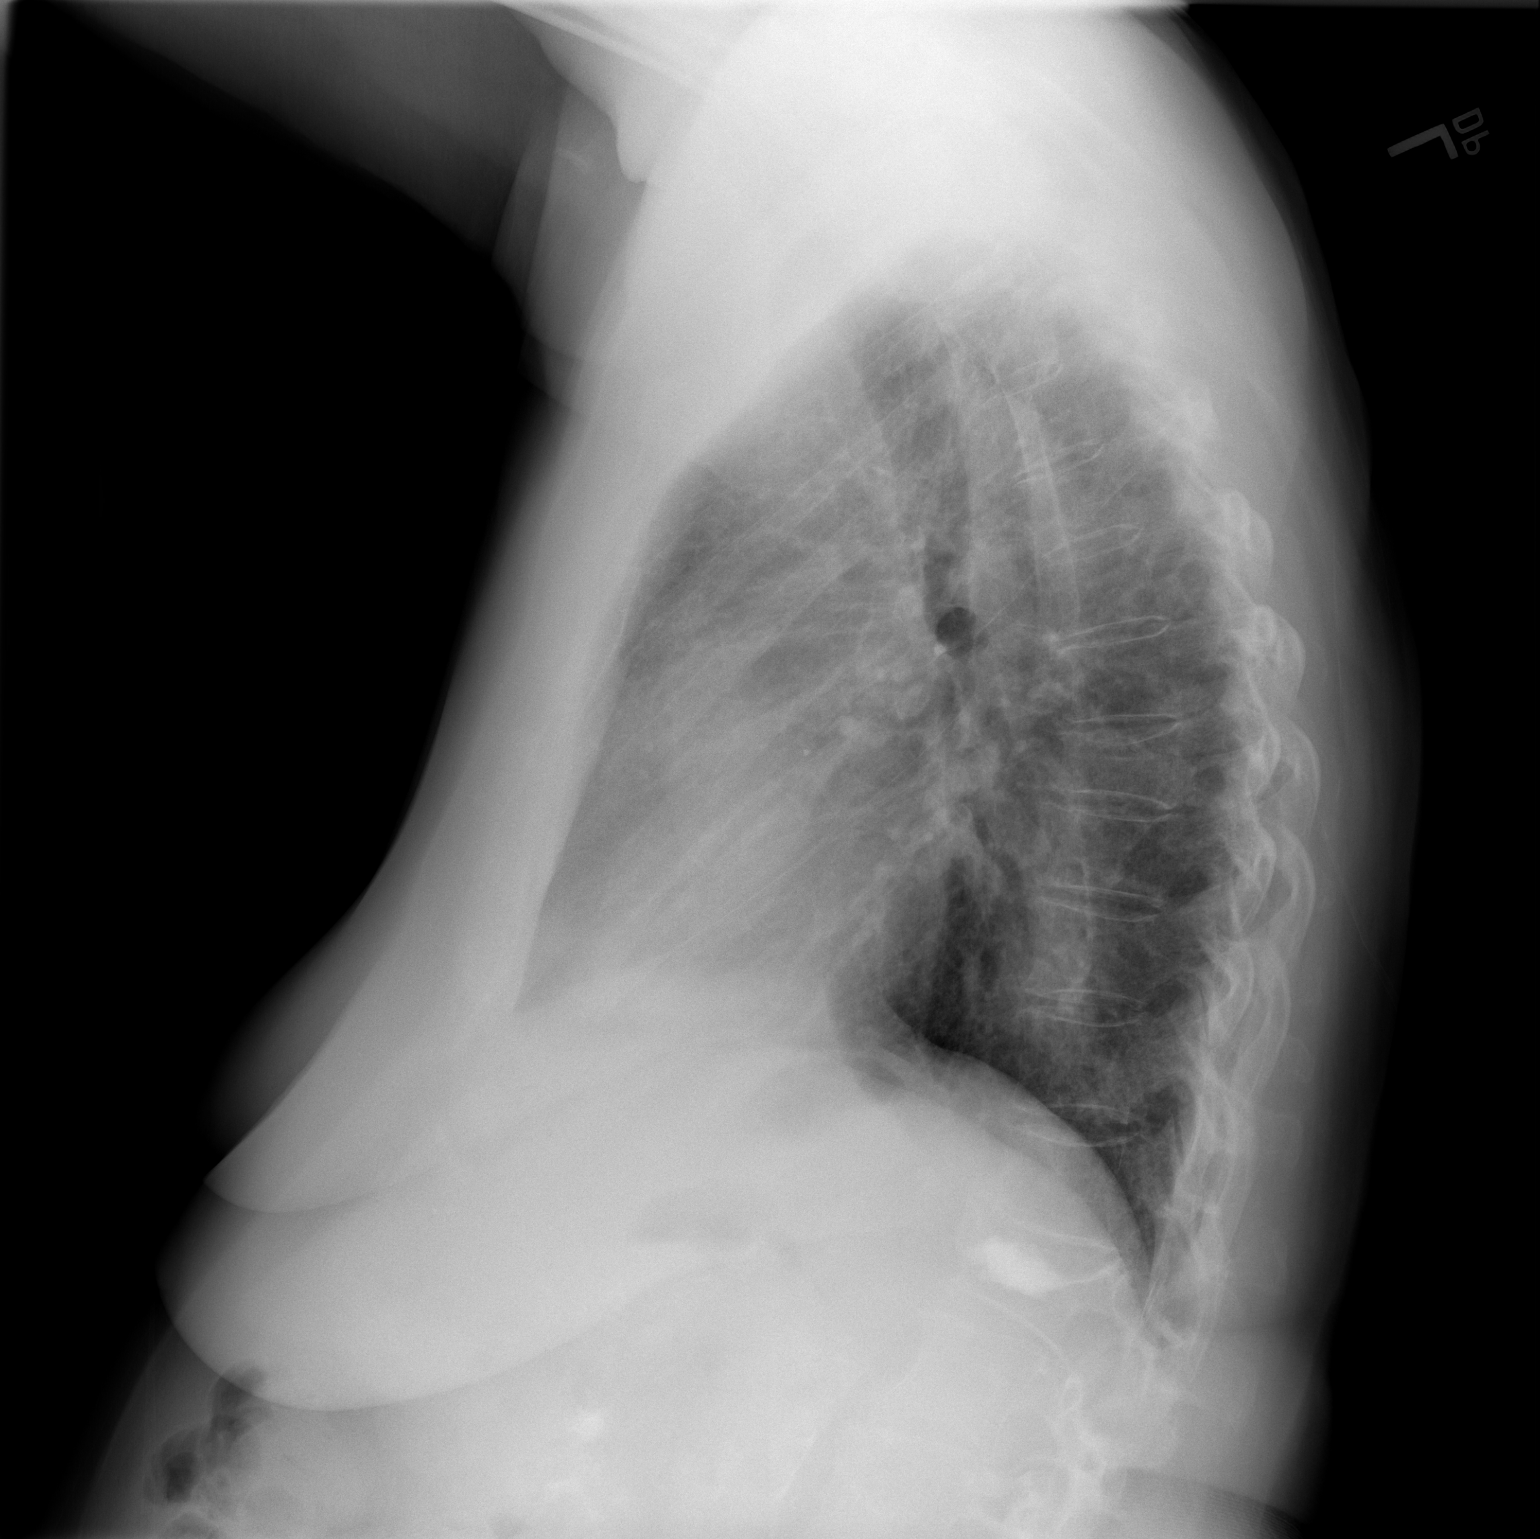

[2 of 2 positions shown; findings below may reference images not displayed]

FINDINGS: Lungs are clear.

Heart size and mediastinal contours are within normal limits. Aortic
Atherosclerosis ([id number]-[id number]).

No effusion.

Old L1 cement augmentation. Mild T8 compression deformity, increased
since prior study.
IMPRESSION: 1.  No acute cardiopulmonary disease.
2. Progressive mild T8 compression deformity since 11/24/2019.

## 2023-08-14 ENCOUNTER — Ambulatory Visit
Admission: RE | Admit: 2023-08-14 | Discharge: 2023-08-14 | Disposition: A | Discharge status: Home / Self Care | Source: Ambulatory Visit | Attending: Internal Medicine | Admitting: Internal Medicine

## 2023-08-14 DIAGNOSIS — Z1231 Encounter for screening mammogram for malignant neoplasm of breast: Secondary | ICD-10-CM

## 2023-09-11 DIAGNOSIS — N1832 Chronic kidney disease, stage 3b: Secondary | ICD-10-CM | POA: Diagnosis not present

## 2023-09-11 DIAGNOSIS — E785 Hyperlipidemia, unspecified: Secondary | ICD-10-CM | POA: Diagnosis not present

## 2023-09-11 DIAGNOSIS — I129 Hypertensive chronic kidney disease with stage 1 through stage 4 chronic kidney disease, or unspecified chronic kidney disease: Secondary | ICD-10-CM | POA: Diagnosis not present

## 2023-09-11 DIAGNOSIS — E1122 Type 2 diabetes mellitus with diabetic chronic kidney disease: Secondary | ICD-10-CM | POA: Diagnosis not present

## 2023-09-11 DIAGNOSIS — R799 Abnormal finding of blood chemistry, unspecified: Secondary | ICD-10-CM | POA: Diagnosis not present

## 2023-09-16 DIAGNOSIS — F3341 Major depressive disorder, recurrent, in partial remission: Secondary | ICD-10-CM | POA: Diagnosis not present

## 2023-09-16 DIAGNOSIS — E1165 Type 2 diabetes mellitus with hyperglycemia: Secondary | ICD-10-CM | POA: Diagnosis not present

## 2023-09-16 DIAGNOSIS — I10 Essential (primary) hypertension: Secondary | ICD-10-CM | POA: Diagnosis not present

## 2023-09-16 DIAGNOSIS — K5900 Constipation, unspecified: Secondary | ICD-10-CM | POA: Diagnosis not present

## 2023-09-16 DIAGNOSIS — N1832 Chronic kidney disease, stage 3b: Secondary | ICD-10-CM | POA: Diagnosis not present

## 2023-09-16 DIAGNOSIS — E559 Vitamin D deficiency, unspecified: Secondary | ICD-10-CM | POA: Diagnosis not present

## 2023-10-29 DIAGNOSIS — I6523 Occlusion and stenosis of bilateral carotid arteries: Secondary | ICD-10-CM | POA: Diagnosis not present

## 2023-10-29 DIAGNOSIS — E11649 Type 2 diabetes mellitus with hypoglycemia without coma: Secondary | ICD-10-CM | POA: Diagnosis not present

## 2023-10-29 DIAGNOSIS — R945 Abnormal results of liver function studies: Secondary | ICD-10-CM | POA: Diagnosis not present

## 2023-10-29 DIAGNOSIS — E559 Vitamin D deficiency, unspecified: Secondary | ICD-10-CM | POA: Diagnosis not present

## 2023-10-29 DIAGNOSIS — E1169 Type 2 diabetes mellitus with other specified complication: Secondary | ICD-10-CM | POA: Diagnosis not present

## 2023-10-29 DIAGNOSIS — Z634 Disappearance and death of family member: Secondary | ICD-10-CM | POA: Diagnosis not present

## 2023-10-29 DIAGNOSIS — E1165 Type 2 diabetes mellitus with hyperglycemia: Secondary | ICD-10-CM | POA: Diagnosis not present

## 2023-10-29 DIAGNOSIS — N1832 Chronic kidney disease, stage 3b: Secondary | ICD-10-CM | POA: Diagnosis not present

## 2023-10-31 DIAGNOSIS — Z961 Presence of intraocular lens: Secondary | ICD-10-CM | POA: Diagnosis not present

## 2023-10-31 DIAGNOSIS — H52203 Unspecified astigmatism, bilateral: Secondary | ICD-10-CM | POA: Diagnosis not present

## 2023-10-31 DIAGNOSIS — E119 Type 2 diabetes mellitus without complications: Secondary | ICD-10-CM | POA: Diagnosis not present

## 2023-10-31 DIAGNOSIS — H43813 Vitreous degeneration, bilateral: Secondary | ICD-10-CM | POA: Diagnosis not present

## 2023-11-12 DIAGNOSIS — M65342 Trigger finger, left ring finger: Secondary | ICD-10-CM | POA: Diagnosis not present

## 2023-11-12 DIAGNOSIS — M65331 Trigger finger, right middle finger: Secondary | ICD-10-CM | POA: Diagnosis not present

## 2023-12-11 DIAGNOSIS — R296 Repeated falls: Secondary | ICD-10-CM | POA: Diagnosis not present

## 2023-12-11 DIAGNOSIS — E1165 Type 2 diabetes mellitus with hyperglycemia: Secondary | ICD-10-CM | POA: Diagnosis not present

## 2023-12-11 DIAGNOSIS — E11649 Type 2 diabetes mellitus with hypoglycemia without coma: Secondary | ICD-10-CM | POA: Diagnosis not present

## 2023-12-11 DIAGNOSIS — E559 Vitamin D deficiency, unspecified: Secondary | ICD-10-CM | POA: Diagnosis not present

## 2023-12-11 DIAGNOSIS — R945 Abnormal results of liver function studies: Secondary | ICD-10-CM | POA: Diagnosis not present

## 2023-12-11 DIAGNOSIS — I10 Essential (primary) hypertension: Secondary | ICD-10-CM | POA: Diagnosis not present

## 2023-12-11 DIAGNOSIS — N1832 Chronic kidney disease, stage 3b: Secondary | ICD-10-CM | POA: Diagnosis not present

## 2023-12-11 DIAGNOSIS — E538 Deficiency of other specified B group vitamins: Secondary | ICD-10-CM | POA: Diagnosis not present

## 2023-12-13 DIAGNOSIS — M65342 Trigger finger, left ring finger: Secondary | ICD-10-CM | POA: Diagnosis not present

## 2023-12-13 DIAGNOSIS — M65331 Trigger finger, right middle finger: Secondary | ICD-10-CM | POA: Diagnosis not present

## 2023-12-18 DIAGNOSIS — E669 Obesity, unspecified: Secondary | ICD-10-CM | POA: Diagnosis not present

## 2023-12-18 DIAGNOSIS — F3341 Major depressive disorder, recurrent, in partial remission: Secondary | ICD-10-CM | POA: Diagnosis not present

## 2023-12-18 DIAGNOSIS — N1832 Chronic kidney disease, stage 3b: Secondary | ICD-10-CM | POA: Diagnosis not present

## 2023-12-18 DIAGNOSIS — I10 Essential (primary) hypertension: Secondary | ICD-10-CM | POA: Diagnosis not present

## 2023-12-18 DIAGNOSIS — E1169 Type 2 diabetes mellitus with other specified complication: Secondary | ICD-10-CM | POA: Diagnosis not present

## 2023-12-24 DIAGNOSIS — J069 Acute upper respiratory infection, unspecified: Secondary | ICD-10-CM | POA: Diagnosis not present

## 2023-12-24 DIAGNOSIS — U071 COVID-19: Secondary | ICD-10-CM | POA: Diagnosis not present

## 2023-12-24 DIAGNOSIS — E1165 Type 2 diabetes mellitus with hyperglycemia: Secondary | ICD-10-CM | POA: Diagnosis not present

## 2023-12-24 DIAGNOSIS — J309 Allergic rhinitis, unspecified: Secondary | ICD-10-CM | POA: Diagnosis not present

## 2024-01-07 ENCOUNTER — Other Ambulatory Visit: Payer: Self-pay | Admitting: Internal Medicine

## 2024-01-07 DIAGNOSIS — J029 Acute pharyngitis, unspecified: Secondary | ICD-10-CM | POA: Diagnosis not present

## 2024-01-07 DIAGNOSIS — R519 Headache, unspecified: Secondary | ICD-10-CM

## 2024-01-07 DIAGNOSIS — U071 COVID-19: Secondary | ICD-10-CM | POA: Diagnosis not present

## 2024-01-07 DIAGNOSIS — Z9181 History of falling: Secondary | ICD-10-CM | POA: Diagnosis not present

## 2024-01-09 DIAGNOSIS — M65342 Trigger finger, left ring finger: Secondary | ICD-10-CM | POA: Diagnosis not present

## 2024-01-09 DIAGNOSIS — M65331 Trigger finger, right middle finger: Secondary | ICD-10-CM | POA: Diagnosis not present

## 2024-01-10 ENCOUNTER — Encounter: Payer: Self-pay | Admitting: Internal Medicine

## 2024-01-16 ENCOUNTER — Ambulatory Visit
Admission: RE | Admit: 2024-01-16 | Discharge: 2024-01-16 | Disposition: A | Discharge status: Home / Self Care | Source: Ambulatory Visit | Attending: Internal Medicine | Admitting: Internal Medicine

## 2024-01-16 DIAGNOSIS — R519 Headache, unspecified: Secondary | ICD-10-CM | POA: Diagnosis not present

## 2024-01-16 DIAGNOSIS — S0990XA Unspecified injury of head, initial encounter: Secondary | ICD-10-CM | POA: Diagnosis not present

## 2024-02-20 DIAGNOSIS — I10 Essential (primary) hypertension: Secondary | ICD-10-CM | POA: Diagnosis not present

## 2024-02-20 DIAGNOSIS — F3341 Major depressive disorder, recurrent, in partial remission: Secondary | ICD-10-CM | POA: Diagnosis not present

## 2024-02-20 DIAGNOSIS — G4733 Obstructive sleep apnea (adult) (pediatric): Secondary | ICD-10-CM | POA: Diagnosis not present

## 2024-02-20 DIAGNOSIS — Z1331 Encounter for screening for depression: Secondary | ICD-10-CM | POA: Diagnosis not present

## 2024-02-20 DIAGNOSIS — E1169 Type 2 diabetes mellitus with other specified complication: Secondary | ICD-10-CM | POA: Diagnosis not present

## 2024-02-20 DIAGNOSIS — Z789 Other specified health status: Secondary | ICD-10-CM | POA: Diagnosis not present

## 2024-02-20 DIAGNOSIS — I6523 Occlusion and stenosis of bilateral carotid arteries: Secondary | ICD-10-CM | POA: Diagnosis not present

## 2024-02-20 DIAGNOSIS — E6609 Other obesity due to excess calories: Secondary | ICD-10-CM | POA: Diagnosis not present

## 2024-02-20 DIAGNOSIS — E1165 Type 2 diabetes mellitus with hyperglycemia: Secondary | ICD-10-CM | POA: Diagnosis not present

## 2024-02-21 DIAGNOSIS — E1142 Type 2 diabetes mellitus with diabetic polyneuropathy: Secondary | ICD-10-CM | POA: Diagnosis not present

## 2024-02-21 DIAGNOSIS — R32 Unspecified urinary incontinence: Secondary | ICD-10-CM | POA: Diagnosis not present

## 2024-02-21 DIAGNOSIS — R030 Elevated blood-pressure reading, without diagnosis of hypertension: Secondary | ICD-10-CM | POA: Diagnosis not present

## 2024-02-21 DIAGNOSIS — N184 Chronic kidney disease, stage 4 (severe): Secondary | ICD-10-CM | POA: Diagnosis not present

## 2024-02-21 DIAGNOSIS — Z8249 Family history of ischemic heart disease and other diseases of the circulatory system: Secondary | ICD-10-CM | POA: Diagnosis not present

## 2024-02-21 DIAGNOSIS — E785 Hyperlipidemia, unspecified: Secondary | ICD-10-CM | POA: Diagnosis not present

## 2024-02-21 DIAGNOSIS — M1990 Unspecified osteoarthritis, unspecified site: Secondary | ICD-10-CM | POA: Diagnosis not present

## 2024-02-21 DIAGNOSIS — K5900 Constipation, unspecified: Secondary | ICD-10-CM | POA: Diagnosis not present

## 2024-02-21 DIAGNOSIS — E1122 Type 2 diabetes mellitus with diabetic chronic kidney disease: Secondary | ICD-10-CM | POA: Diagnosis not present

## 2024-03-02 DIAGNOSIS — Z87898 Personal history of other specified conditions: Secondary | ICD-10-CM | POA: Diagnosis not present

## 2024-03-02 DIAGNOSIS — Z136 Encounter for screening for cardiovascular disorders: Secondary | ICD-10-CM | POA: Diagnosis not present

## 2024-03-02 DIAGNOSIS — L03019 Cellulitis of unspecified finger: Secondary | ICD-10-CM | POA: Diagnosis not present

## 2024-03-02 DIAGNOSIS — K219 Gastro-esophageal reflux disease without esophagitis: Secondary | ICD-10-CM | POA: Diagnosis not present

## 2024-03-02 DIAGNOSIS — G4733 Obstructive sleep apnea (adult) (pediatric): Secondary | ICD-10-CM | POA: Diagnosis not present

## 2024-03-02 DIAGNOSIS — F3341 Major depressive disorder, recurrent, in partial remission: Secondary | ICD-10-CM | POA: Diagnosis not present

## 2024-03-02 DIAGNOSIS — Z1331 Encounter for screening for depression: Secondary | ICD-10-CM | POA: Diagnosis not present

## 2024-03-02 DIAGNOSIS — I10 Essential (primary) hypertension: Secondary | ICD-10-CM | POA: Diagnosis not present

## 2024-03-02 DIAGNOSIS — Z23 Encounter for immunization: Secondary | ICD-10-CM | POA: Diagnosis not present

## 2024-03-02 DIAGNOSIS — Z0000 Encounter for general adult medical examination without abnormal findings: Secondary | ICD-10-CM | POA: Diagnosis not present

## 2024-03-02 DIAGNOSIS — E1169 Type 2 diabetes mellitus with other specified complication: Secondary | ICD-10-CM | POA: Diagnosis not present

## 2024-03-02 DIAGNOSIS — N1832 Chronic kidney disease, stage 3b: Secondary | ICD-10-CM | POA: Diagnosis not present

## 2024-03-05 DIAGNOSIS — F3341 Major depressive disorder, recurrent, in partial remission: Secondary | ICD-10-CM | POA: Diagnosis not present

## 2024-03-05 DIAGNOSIS — I10 Essential (primary) hypertension: Secondary | ICD-10-CM | POA: Diagnosis not present

## 2024-03-05 DIAGNOSIS — Z789 Other specified health status: Secondary | ICD-10-CM | POA: Diagnosis not present

## 2024-03-05 DIAGNOSIS — E1169 Type 2 diabetes mellitus with other specified complication: Secondary | ICD-10-CM | POA: Diagnosis not present

## 2024-03-05 DIAGNOSIS — E1165 Type 2 diabetes mellitus with hyperglycemia: Secondary | ICD-10-CM | POA: Diagnosis not present

## 2024-03-05 DIAGNOSIS — E6609 Other obesity due to excess calories: Secondary | ICD-10-CM | POA: Diagnosis not present

## 2024-03-05 DIAGNOSIS — I6523 Occlusion and stenosis of bilateral carotid arteries: Secondary | ICD-10-CM | POA: Diagnosis not present

## 2024-03-05 DIAGNOSIS — N1832 Chronic kidney disease, stage 3b: Secondary | ICD-10-CM | POA: Diagnosis not present

## 2024-03-05 DIAGNOSIS — G4733 Obstructive sleep apnea (adult) (pediatric): Secondary | ICD-10-CM | POA: Diagnosis not present

## 2024-03-12 DIAGNOSIS — E1122 Type 2 diabetes mellitus with diabetic chronic kidney disease: Secondary | ICD-10-CM | POA: Diagnosis not present

## 2024-03-12 DIAGNOSIS — I10 Essential (primary) hypertension: Secondary | ICD-10-CM | POA: Diagnosis not present

## 2024-03-12 DIAGNOSIS — D631 Anemia in chronic kidney disease: Secondary | ICD-10-CM | POA: Diagnosis not present

## 2024-03-12 DIAGNOSIS — I6523 Occlusion and stenosis of bilateral carotid arteries: Secondary | ICD-10-CM | POA: Diagnosis not present

## 2024-03-12 DIAGNOSIS — N1832 Chronic kidney disease, stage 3b: Secondary | ICD-10-CM | POA: Diagnosis not present

## 2024-03-12 DIAGNOSIS — N189 Chronic kidney disease, unspecified: Secondary | ICD-10-CM | POA: Diagnosis not present

## 2024-03-12 DIAGNOSIS — E1165 Type 2 diabetes mellitus with hyperglycemia: Secondary | ICD-10-CM | POA: Diagnosis not present

## 2024-03-12 DIAGNOSIS — E559 Vitamin D deficiency, unspecified: Secondary | ICD-10-CM | POA: Diagnosis not present

## 2024-03-12 DIAGNOSIS — I129 Hypertensive chronic kidney disease with stage 1 through stage 4 chronic kidney disease, or unspecified chronic kidney disease: Secondary | ICD-10-CM | POA: Diagnosis not present

## 2024-03-12 DIAGNOSIS — E1169 Type 2 diabetes mellitus with other specified complication: Secondary | ICD-10-CM | POA: Diagnosis not present

## 2024-03-17 DIAGNOSIS — R2689 Other abnormalities of gait and mobility: Secondary | ICD-10-CM | POA: Diagnosis not present

## 2024-03-17 DIAGNOSIS — W19XXXA Unspecified fall, initial encounter: Secondary | ICD-10-CM | POA: Diagnosis not present

## 2024-03-17 DIAGNOSIS — R296 Repeated falls: Secondary | ICD-10-CM | POA: Diagnosis not present

## 2024-03-17 DIAGNOSIS — R0789 Other chest pain: Secondary | ICD-10-CM | POA: Diagnosis not present

## 2024-03-19 DIAGNOSIS — E1169 Type 2 diabetes mellitus with other specified complication: Secondary | ICD-10-CM | POA: Diagnosis not present

## 2024-03-19 DIAGNOSIS — I10 Essential (primary) hypertension: Secondary | ICD-10-CM | POA: Diagnosis not present

## 2024-03-19 DIAGNOSIS — F3341 Major depressive disorder, recurrent, in partial remission: Secondary | ICD-10-CM | POA: Diagnosis not present

## 2024-03-19 DIAGNOSIS — I6523 Occlusion and stenosis of bilateral carotid arteries: Secondary | ICD-10-CM | POA: Diagnosis not present

## 2024-03-19 DIAGNOSIS — N1832 Chronic kidney disease, stage 3b: Secondary | ICD-10-CM | POA: Diagnosis not present

## 2024-03-19 DIAGNOSIS — G4733 Obstructive sleep apnea (adult) (pediatric): Secondary | ICD-10-CM | POA: Diagnosis not present

## 2024-03-19 DIAGNOSIS — R296 Repeated falls: Secondary | ICD-10-CM | POA: Diagnosis not present

## 2024-03-19 DIAGNOSIS — Z789 Other specified health status: Secondary | ICD-10-CM | POA: Diagnosis not present

## 2024-03-19 DIAGNOSIS — E1165 Type 2 diabetes mellitus with hyperglycemia: Secondary | ICD-10-CM | POA: Diagnosis not present

## 2024-03-24 DIAGNOSIS — N1832 Chronic kidney disease, stage 3b: Secondary | ICD-10-CM | POA: Diagnosis not present

## 2024-03-24 DIAGNOSIS — L0390 Cellulitis, unspecified: Secondary | ICD-10-CM | POA: Diagnosis not present

## 2024-03-24 DIAGNOSIS — E1169 Type 2 diabetes mellitus with other specified complication: Secondary | ICD-10-CM | POA: Diagnosis not present

## 2024-04-13 DIAGNOSIS — I6523 Occlusion and stenosis of bilateral carotid arteries: Secondary | ICD-10-CM | POA: Diagnosis not present

## 2024-04-13 DIAGNOSIS — F3341 Major depressive disorder, recurrent, in partial remission: Secondary | ICD-10-CM | POA: Diagnosis not present

## 2024-04-13 DIAGNOSIS — G4733 Obstructive sleep apnea (adult) (pediatric): Secondary | ICD-10-CM | POA: Diagnosis not present

## 2024-04-13 DIAGNOSIS — Z789 Other specified health status: Secondary | ICD-10-CM | POA: Diagnosis not present

## 2024-04-13 DIAGNOSIS — I10 Essential (primary) hypertension: Secondary | ICD-10-CM | POA: Diagnosis not present

## 2024-04-13 DIAGNOSIS — E1169 Type 2 diabetes mellitus with other specified complication: Secondary | ICD-10-CM | POA: Diagnosis not present

## 2024-04-13 DIAGNOSIS — N1832 Chronic kidney disease, stage 3b: Secondary | ICD-10-CM | POA: Diagnosis not present

## 2024-04-13 DIAGNOSIS — E1165 Type 2 diabetes mellitus with hyperglycemia: Secondary | ICD-10-CM | POA: Diagnosis not present

## 2024-04-13 DIAGNOSIS — R296 Repeated falls: Secondary | ICD-10-CM | POA: Diagnosis not present

## 2024-07-16 ENCOUNTER — Ambulatory Visit: Admitting: Neurology
